# Patient Record
Sex: Female | Born: 1960 | Race: Black or African American | Hispanic: No | Smoking: Former smoker
Health system: Southern US, Community
[De-identification: ages and names within clinical notes are randomized; demographics above are authoritative.]

## PROBLEM LIST (undated history)

## (undated) DIAGNOSIS — D219 Benign neoplasm of connective and other soft tissue, unspecified: Secondary | ICD-10-CM

## (undated) DIAGNOSIS — G4730 Sleep apnea, unspecified: Secondary | ICD-10-CM

## (undated) HISTORY — PX: TOTAL ABDOMINAL HYSTERECTOMY: SHX209

## (undated) HISTORY — PX: TUBAL LIGATION: SHX77

## (undated) HISTORY — DX: Benign neoplasm of connective and other soft tissue, unspecified: D21.9

## (undated) HISTORY — PX: LASIK: SHX215

## (undated) HISTORY — PX: ABDOMINAL HYSTERECTOMY: SHX81

## (undated) HISTORY — PX: OTHER SURGICAL HISTORY: SHX169

## (undated) HISTORY — DX: Morbid (severe) obesity due to excess calories: E66.01

---
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
## 1998-10-04 ENCOUNTER — Other Ambulatory Visit: Admission: RE | Admit: 1998-10-04 | Discharge: 1998-10-04 | Payer: Self-pay | Admitting: Obstetrics and Gynecology

## 2000-06-02 ENCOUNTER — Emergency Department (HOSPITAL_COMMUNITY): Admission: EM | Admit: 2000-06-02 | Discharge: 2000-06-02 | Payer: Self-pay | Admitting: Emergency Medicine

## 2002-09-04 ENCOUNTER — Emergency Department (HOSPITAL_COMMUNITY): Admission: EM | Admit: 2002-09-04 | Discharge: 2002-09-04 | Payer: Self-pay

## 2002-09-29 ENCOUNTER — Ambulatory Visit (HOSPITAL_COMMUNITY): Admission: RE | Admit: 2002-09-29 | Discharge: 2002-09-29 | Payer: Self-pay | Admitting: Internal Medicine

## 2002-09-29 ENCOUNTER — Encounter: Payer: Self-pay | Admitting: Internal Medicine

## 2007-04-23 ENCOUNTER — Emergency Department (HOSPITAL_COMMUNITY): Admission: EM | Admit: 2007-04-23 | Discharge: 2007-04-23 | Payer: Self-pay | Admitting: Emergency Medicine

## 2019-12-10 DIAGNOSIS — G4733 Obstructive sleep apnea (adult) (pediatric): Secondary | ICD-10-CM

## 2019-12-10 HISTORY — DX: Obstructive sleep apnea (adult) (pediatric): G47.33

## 2020-07-27 ENCOUNTER — Encounter: Payer: Self-pay | Admitting: Family Medicine

## 2020-07-27 ENCOUNTER — Ambulatory Visit (INDEPENDENT_AMBULATORY_CARE_PROVIDER_SITE_OTHER): Payer: 59 | Admitting: Family Medicine

## 2020-07-27 ENCOUNTER — Other Ambulatory Visit: Payer: Self-pay

## 2020-07-27 VITALS — BP 140/82 | HR 84 | Temp 96.4°F | Resp 18 | Ht 64.0 in | Wt 294.0 lb

## 2020-07-27 DIAGNOSIS — G4733 Obstructive sleep apnea (adult) (pediatric): Secondary | ICD-10-CM

## 2020-07-27 DIAGNOSIS — R0789 Other chest pain: Secondary | ICD-10-CM

## 2020-07-27 DIAGNOSIS — R062 Wheezing: Secondary | ICD-10-CM

## 2020-07-27 DIAGNOSIS — Z1231 Encounter for screening mammogram for malignant neoplasm of breast: Secondary | ICD-10-CM

## 2020-07-27 DIAGNOSIS — N3941 Urge incontinence: Secondary | ICD-10-CM

## 2020-07-27 DIAGNOSIS — Z6841 Body Mass Index (BMI) 40.0 and over, adult: Secondary | ICD-10-CM

## 2020-07-27 LAB — POCT URINALYSIS DIP (CLINITEK)
Bilirubin, UA: NEGATIVE
Glucose, UA: NEGATIVE mg/dL
Ketones, POC UA: NEGATIVE mg/dL
Leukocytes, UA: NEGATIVE
Nitrite, UA: NEGATIVE
Spec Grav, UA: 1.015 (ref 1.010–1.025)
Urobilinogen, UA: 0.2 E.U./dL
pH, UA: 6 (ref 5.0–8.0)

## 2020-07-27 LAB — TROPONIN T: Troponin T (Highly Sensitive): 6 ng/L (ref 0–14)

## 2020-07-27 NOTE — Progress Notes (Signed)
New Patient Office Visit  Subjective:  Patient ID: Virginia Barron, female    DOB: 1961-02-06  Age: 59 y.o. MRN: [id number]  CC:  Chief Complaint  Patient presents with  . Chest Pain    HPI Virginia Barron presents as a new patient. She is complaining of chest tightness about twice a week for 1-2 months. Usually at bedtime. Accompanied by wheezing. Complaining of swelling in legs. Occasionally her left arm goes a little numb.  Patient has not been seen by primary care physician since 2009.  This was apparently due to lack of insurance.  Has OSA. Has the machine, but needs filters/tubing. Diagnosed 6 months ago through a study.  Home she is currently living is infested with roaches and mold. Living there since 2013. Per patient landlord is working on situation.  Patient has had no preventative care in years.  Past Medical History:  Diagnosis Date  . Fibroids   . Morbid obesity ([place])   . OSA on CPAP 2021    Past Surgical History:  Procedure Laterality Date  . ABDOMINAL HYSTERECTOMY     precancerous cervical cells and fibroids.  . conescopy     abnormal pap  . LASIK    . TUBAL LIGATION      Family History  Problem Relation Age of Onset  . Diabetes Mother   . Diabetes Father   . Diabetes Sister     Social History   Socioeconomic History  . Marital status: Divorced    Spouse name: Not on file  . Number of children: Not on file  . Years of education: Not on file  . Highest education level: Not on file  Occupational History  . Not on file  Tobacco Use  . Smoking status: Never Smoker  . Smokeless tobacco: Never Used  Substance and Sexual Activity  . Alcohol use: Yes    Alcohol/week: 1.0 standard drink    Types: 1 Standard drinks or equivalent per week    Comment: per month  . Drug use: Never  . Sexual activity: Not on file  Other Topics Concern  . Not on file  Social History Narrative  . Not on file   Social Determinants of Health   Financial Resource  Strain:   . Difficulty of Paying Living Expenses: Not on file  Food Insecurity:   . Worried About Charity fundraiser in the Last Year: Not on file  . Ran Out of Food in the Last Year: Not on file  Transportation Needs:   . Lack of Transportation (Medical): Not on file  . Lack of Transportation (Non-Medical): Not on file  Physical Activity:   . Days of Exercise per Week: Not on file  . Minutes of Exercise per Session: Not on file  Stress:   . Feeling of Stress : Not on file  Social Connections:   . Frequency of Communication with Friends and Family: Not on file  . Frequency of Social Gatherings with Friends and Family: Not on file  . Attends Religious Services: Not on file  . Active Member of Clubs or Organizations: Not on file  . Attends Archivist Meetings: Not on file  . Marital Status: Not on file  Intimate Partner Violence:   . Fear of Current or Ex-Partner: Not on file  . Emotionally Abused: Not on file  . Physically Abused: Not on file  . Sexually Abused: Not on file    ROS Review of Systems  Constitutional: Negative for chills,  fatigue and fever.  HENT: Negative for congestion, ear pain and sore throat.   Respiratory: Positive for shortness of breath and wheezing. Negative for cough.   Cardiovascular: Positive for chest pain and leg swelling.  Gastrointestinal: Negative for abdominal pain, constipation, diarrhea, nausea and vomiting.  Genitourinary: Negative for dysuria and urgency.       Some urge incontinence  Musculoskeletal: Negative for arthralgias and myalgias.  Skin: Negative for rash.  Neurological: Negative for dizziness and headaches.  Psychiatric/Behavioral: Negative for dysphoric mood. The patient is not nervous/anxious.     Objective:   Today's Vitals: BP 140/82   Pulse 84   Temp (!) 96.4 F (35.8 C)   Resp 18   Ht 5\' 4"  (1.626 m)   Wt 294 lb (133.4 kg)   BMI 50.46 kg/m   Physical Exam Vitals reviewed.  Constitutional:       Appearance: Normal appearance. She is well-developed. She is obese.  Cardiovascular:     Rate and Rhythm: Normal rate and regular rhythm.     Pulses: Normal pulses.     Heart sounds: Normal heart sounds.  Pulmonary:     Effort: Pulmonary effort is normal. No respiratory distress.     Breath sounds: Normal breath sounds.  Abdominal:     General: Abdomen is flat. Bowel sounds are normal.     Palpations: Abdomen is soft.     Tenderness: There is no abdominal tenderness.  Neurological:     Mental Status: She is alert and oriented to person, place, and time.  Psychiatric:        Mood and Affect: Mood normal.        Behavior: Behavior normal.     Assessment & Plan:  1. Urge incontinence - POCT URINALYSIS DIP (CLINITEK) - normal  2. OSA (obstructive sleep apnea) Order supplies for patient.   3. Atypical chest pain - Recommend start on aspirin 81 mg once daily. - EKG - abnormal. Inverted T waves in multiple leads. Patient needs cardiac workup if troponin T is negative. - Refer to cardiology. - Troponin T  4. Wheezing - by history only. Lungs clear today. Likely due to mold in home. I strongly recommend pt discuss further with land lord.   5. Morbid obesity with body mass index (BMI) of 50.0 to 59.9 in adult [facility]) Will have pt return to discuss diet and exercise, as well as check her cholesterol. - CBC with Differential/Platelet - Comprehensive metabolic panel - TSH  6. Screening for breast cancer - order mammogram.   Outpatient Encounter Medications as of 07/27/2020  Medication Sig  . cholecalciferol (VITAMIN D3) 25 MCG (1000 UNIT) tablet Take 1,000 Units by mouth daily.  . Multiple Vitamin (MULTIVITAMIN) tablet Take 1 tablet by mouth daily.   No facility-administered encounter medications on file as of 07/27/2020.    Follow-up: No follow-ups on file.   [name], MD

## 2020-07-29 LAB — COMPREHENSIVE METABOLIC PANEL
ALT: 21 IU/L (ref 0–32)
AST: 25 IU/L (ref 0–40)
Albumin/Globulin Ratio: 1.6 (ref 1.2–2.2)
Albumin: 4.6 g/dL (ref 3.8–4.9)
Alkaline Phosphatase: 78 IU/L (ref 48–121)
BUN/Creatinine Ratio: 16 (ref 12–28)
BUN: 16 mg/dL (ref 8–27)
Bilirubin Total: 0.3 mg/dL (ref 0.0–1.2)
CO2: 22 mmol/L (ref 20–29)
Calcium: 9.4 mg/dL (ref 8.7–10.3)
Chloride: 102 mmol/L (ref 96–106)
Creatinine, Ser: 1.02 mg/dL — ABNORMAL HIGH (ref 0.57–1.00)
GFR calc Af Amer: 69 mL/min/{1.73_m2} (ref >59)
GFR calc non Af Amer: 60 mL/min/{1.73_m2} (ref >59)
Globulin, Total: 2.8 g/dL (ref 1.5–4.5)
Glucose: 85 mg/dL (ref 65–99)
Potassium: 4 mmol/L (ref 3.5–5.2)
Sodium: 140 mmol/L (ref 134–144)
Total Protein: 7.4 g/dL (ref 6.0–8.5)

## 2020-07-29 LAB — CBC WITH DIFFERENTIAL/PLATELET
Basophils Absolute: 0.1 10*3/uL (ref 0.0–0.2)
Basos: 1 %
EOS (ABSOLUTE): 0.2 10*3/uL (ref 0.0–0.4)
Eos: 3 %
Hematocrit: 46.5 % (ref 34.0–46.6)
Hemoglobin: 14.6 g/dL (ref 11.1–15.9)
Immature Grans (Abs): 0 10*3/uL (ref 0.0–0.1)
Immature Granulocytes: 0 %
Lymphocytes Absolute: 2.1 10*3/uL (ref 0.7–3.1)
Lymphs: 25 %
MCH: 24.8 pg — ABNORMAL LOW (ref 26.6–33.0)
MCHC: 31.4 g/dL — ABNORMAL LOW (ref 31.5–35.7)
MCV: 79 fL (ref 79–97)
Monocytes Absolute: 0.8 10*3/uL (ref 0.1–0.9)
Monocytes: 10 %
Neutrophils Absolute: 5.1 10*3/uL (ref 1.4–7.0)
Neutrophils: 61 %
Platelets: 293 10*3/uL (ref 150–450)
RBC: 5.89 x10E6/uL — ABNORMAL HIGH (ref 3.77–5.28)
RDW: 12.9 % (ref 11.7–15.4)
WBC: 8.4 10*3/uL (ref 3.4–10.8)

## 2020-07-29 LAB — TSH: TSH: 1.23 u[IU]/mL (ref 0.450–4.500)

## 2020-07-30 ENCOUNTER — Encounter: Payer: Self-pay | Admitting: Family Medicine

## 2020-07-30 DIAGNOSIS — G4733 Obstructive sleep apnea (adult) (pediatric): Secondary | ICD-10-CM | POA: Insufficient documentation

## 2020-07-30 DIAGNOSIS — N3941 Urge incontinence: Secondary | ICD-10-CM | POA: Insufficient documentation

## 2020-07-30 DIAGNOSIS — R0789 Other chest pain: Secondary | ICD-10-CM

## 2020-07-30 DIAGNOSIS — Z6841 Body Mass Index (BMI) 40.0 and over, adult: Secondary | ICD-10-CM | POA: Insufficient documentation

## 2020-07-30 HISTORY — DX: Other chest pain: R07.89

## 2020-07-30 HISTORY — DX: Obstructive sleep apnea (adult) (pediatric): G47.33

## 2020-07-30 HISTORY — DX: Morbid (severe) obesity due to excess calories: E66.01

## 2020-07-30 HISTORY — DX: Urge incontinence: N39.41

## 2020-07-31 ENCOUNTER — Encounter: Payer: Self-pay | Admitting: Family Medicine

## 2020-08-02 ENCOUNTER — Other Ambulatory Visit: Payer: Self-pay | Admitting: Family Medicine

## 2020-08-02 ENCOUNTER — Other Ambulatory Visit: Payer: Self-pay | Admitting: *Deleted

## 2020-08-02 DIAGNOSIS — Z1231 Encounter for screening mammogram for malignant neoplasm of breast: Secondary | ICD-10-CM

## 2020-08-03 ENCOUNTER — Other Ambulatory Visit: Payer: Self-pay

## 2020-08-03 DIAGNOSIS — R0789 Other chest pain: Secondary | ICD-10-CM

## 2020-08-03 NOTE — Progress Notes (Signed)
ca

## 2020-08-06 ENCOUNTER — Encounter: Payer: Self-pay | Admitting: Family Medicine

## 2020-08-11 DIAGNOSIS — D219 Benign neoplasm of connective and other soft tissue, unspecified: Secondary | ICD-10-CM | POA: Insufficient documentation

## 2020-08-15 ENCOUNTER — Encounter: Payer: Self-pay | Admitting: *Deleted

## 2020-08-15 ENCOUNTER — Other Ambulatory Visit: Payer: Self-pay

## 2020-08-15 ENCOUNTER — Ambulatory Visit (INDEPENDENT_AMBULATORY_CARE_PROVIDER_SITE_OTHER): Payer: 59 | Admitting: Cardiology

## 2020-08-15 VITALS — BP 148/84 | HR 81 | Ht 64.0 in | Wt 301.0 lb

## 2020-08-15 DIAGNOSIS — R0602 Shortness of breath: Secondary | ICD-10-CM

## 2020-08-15 DIAGNOSIS — R9431 Abnormal electrocardiogram [ECG] [EKG]: Secondary | ICD-10-CM

## 2020-08-15 DIAGNOSIS — I10 Essential (primary) hypertension: Secondary | ICD-10-CM

## 2020-08-15 DIAGNOSIS — R072 Precordial pain: Secondary | ICD-10-CM

## 2020-08-15 HISTORY — DX: Essential (primary) hypertension: I10

## 2020-08-15 HISTORY — DX: Shortness of breath: R06.02

## 2020-08-15 HISTORY — DX: Precordial pain: R07.2

## 2020-08-15 MED ORDER — METOPROLOL TARTRATE 100 MG PO TABS: 100.0000 mg | ORAL_TABLET | Freq: Once | ORAL | 0 refills | Status: DC

## 2020-08-15 MED ORDER — HYDROCHLOROTHIAZIDE 12.5 MG PO CAPS: 12.5000 mg | ORAL_CAPSULE | Freq: Every day | ORAL | 3 refills | Status: DC

## 2020-08-15 NOTE — Progress Notes (Signed)
Cardiology Office Note:    Date:  4/0/9811   ID:  Virginia Barron, DOB [birth year], MRN [id number]  PCP:  [name], MD  Cardiologist:  No primary care provider on file.  Electrophysiologist:  None   Referring MD: [name], MD   Chief Complaint  Patient presents with  . Chest Pain   History of Present Illness:    Virginia Barron is a 59 y.o. female with a hx of morbid obesity, OSA on CPAP.  The patient was referred by her primary care provider to be evaluated for chest pain and shortness of breath.  The patient tells me that she has been experiencing intermittent shortness of breath which is getting worse.  But she notes that he has chest pains as well.  She described the chest pain as a left-sided squeezing sensation, chest tightness which lasts less than 30 minutes and then resolves.  She tells me that sometimes she gets left arm numbness but notes that she has this maybe once or twice a month.  What bothers him is the shortness of breath.  Note she reported premature coronary artery disease in her father at age 43 he had of coronary artery bypass grafting.  Past Medical History:  Diagnosis Date  . Atypical chest pain 07/30/2020  . Fibroids   . Morbid obesity ([place])   . Morbid obesity with body mass index (BMI) of 50.0 to 59.9 in adult [facility]) 07/30/2020  . OSA (obstructive sleep apnea) 07/30/2020  . OSA on CPAP 2021  . Urge incontinence 07/30/2020    Past Surgical History:  Procedure Laterality Date  . ABDOMINAL HYSTERECTOMY     precancerous cervical cells and fibroids.  . conescopy     abnormal pap  . LASIK    . TUBAL LIGATION      Current Medications: Current Meds  Medication Sig  . aspirin-acetaminophen-caffeine (EXCEDRIN MIGRAINE) 250-250-65 MG tablet Take 1 tablet by mouth every 6 (six) hours as needed for headache.  . cholecalciferol (VITAMIN D3) 25 MCG (1000 UNIT) tablet Take 1,000 Units by mouth daily.  . diphenhydrAMINE (BENADRYL) 25 MG tablet Take 25 mg  by mouth as needed.  . lactobacillus acidophilus (BACID) TABS tablet Take 2 tablets by mouth daily.  . Multiple Vitamin (MULTIVITAMIN) tablet Take 1 tablet by mouth daily.     Allergies:   Avocado, Iodine, Penicillins, Pork-derived products, Shellfish allergy, and Latex   Social History   Socioeconomic History  . Marital status: Divorced    Spouse name: Not on file  . Number of children: Not on file  . Years of education: Not on file  . Highest education level: Not on file  Occupational History  . Not on file  Tobacco Use  . Smoking status: Never Smoker  . Smokeless tobacco: Never Used  Substance and Sexual Activity  . Alcohol use: Yes    Alcohol/week: 1.0 standard drink    Types: 1 Standard drinks or equivalent per week    Comment: per month  . Drug use: Never  . Sexual activity: Not on file  Other Topics Concern  . Not on file  Social History Narrative  . Not on file   Social Determinants of Health   Financial Resource Strain:   . Difficulty of Paying Living Expenses: Not on file  Food Insecurity:   . Worried About Charity fundraiser in the Last Year: Not on file  . Ran Out of Food in the Last Year: Not on file  Transportation Needs:   .  Lack of Transportation (Medical): Not on file  . Lack of Transportation (Non-Medical): Not on file  Physical Activity:   . Days of Exercise per Week: Not on file  . Minutes of Exercise per Session: Not on file  Stress:   . Feeling of Stress : Not on file  Social Connections:   . Frequency of Communication with Friends and Family: Not on file  . Frequency of Social Gatherings with Friends and Family: Not on file  . Attends Religious Services: Not on file  . Active Member of Clubs or Organizations: Not on file  . Attends Archivist Meetings: Not on file  . Marital Status: Not on file     Family History: The patient's family history includes Diabetes in her father, mother, and sister; Heart attack in her  father.  ROS:   Review of Systems  Constitution: Negative for decreased appetite, fever and weight gain.  HENT: Negative for congestion, ear discharge, hoarse voice and sore throat.   Eyes: Negative for discharge, redness, vision loss in right eye and visual halos.  Cardiovascular: Negative for chest pain, dyspnea on exertion, leg swelling, orthopnea and palpitations.  Respiratory: Negative for cough, hemoptysis, shortness of breath and snoring.   Endocrine: Negative for heat intolerance and polyphagia.  Hematologic/Lymphatic: Negative for bleeding problem. Does not bruise/bleed easily.  Skin: Negative for flushing, nail changes, rash and suspicious lesions.  Musculoskeletal: Negative for arthritis, joint pain, muscle cramps, myalgias, neck pain and stiffness.  Gastrointestinal: Negative for abdominal pain, bowel incontinence, diarrhea and excessive appetite.  Genitourinary: Negative for decreased libido, genital sores and incomplete emptying.  Neurological: Negative for brief paralysis, focal weakness, headaches and loss of balance.  Psychiatric/Behavioral: Negative for altered mental status, depression and suicidal ideas.  Allergic/Immunologic: Negative for HIV exposure and persistent infections.    EKGs/Labs/Other Studies Reviewed:    The following studies were reviewed today:   EKG:  The ekg ordered today demonstrates   Recent Labs: 07/27/2020: ALT 21; BUN 16; Creatinine, Ser 1.02; Hemoglobin 14.6; Platelets 293; Potassium 4.0; Sodium 140; TSH 1.230  Recent Lipid Panel No results found for: CHOL, TRIG, HDL, CHOLHDL, VLDL, LDLCALC, LDLDIRECT  Physical Exam:    VS:  BP (!) 148/84 (BP Location: Left Arm, Patient Position: Sitting, Cuff Size: Large)   Pulse 81   Ht 5' 4"  (1.626 m)   Wt (!) 301 lb (136.5 kg)   SpO2 98%   BMI 51.67 kg/m     Wt Readings from Last 3 Encounters:  08/15/20 (!) 301 lb (136.5 kg)  07/27/20 294 lb (133.4 kg)     GEN: Well nourished, well  developed in no acute distress HEENT: Normal NECK: No JVD; No carotid bruits LYMPHATICS: No lymphadenopathy CARDIAC: S1S2 noted,RRR, no murmurs, rubs, gallops RESPIRATORY:  Clear to auscultation without rales, wheezing or rhonchi  ABDOMEN: Soft, non-tender, non-distended, +bowel sounds, no guarding. EXTREMITIES: No edema, No cyanosis, no clubbing MUSCULOSKELETAL:  No deformity  SKIN: Warm and dry NEUROLOGIC:  Alert and oriented x 3, non-focal PSYCHIATRIC:  Normal affect, good insight  ASSESSMENT:    1. Precordial chest pain   2. Essential hypertension   3. Morbid obesity ([place])   4. Shortness of breath   5. Abnormal EKG    PLAN:    Chest pain is concerning given her family history and risk factors.  He also does have abnormal EKG I discussed with the patient that her symptoms still atypical but I am concerned that this may be her anginal  symptoms.  Therefore we will going to pursue an ischemic evaluation in this patient.  The most reasonable testing for her would be coronary CTA.  She has had a pharmacologic stress test 3 years ago which she reported was normal.  With her shortness of breath I will get an echocardiogram for completeness giving her bilateral leg edema as well.  I will be able to assess LV function and any other structural abnormalities.  The patient understands the need to lose weight with diet and exercise. We have discussed specific strategies for this.  Blood pressure is elevated, I reviewed her blood pressure records I do think the patient has any hypertension with a bilateral leg edema: Started patient hydrochlorothiazide 12.5 mg daily. Blood work will be done for BMP and she does need lipid panel however the patient is not fasting today she will come back to in a week to get this done.  The patient is in agreement with the above plan. The patient left the office in stable condition.  The patient will follow up in 1 month due to medication change.   Medication  Adjustments/Labs and Tests Ordered: Current medicines are reviewed at length with the patient today.  Concerns regarding medicines are outlined above.  Orders Placed This Encounter  Procedures  . CT CORONARY MORPH W/CTA COR W/SCORE W/CA W/CM &/OR WO/CM  . CT CORONARY FRACTIONAL FLOW RESERVE DATA PREP  . CT CORONARY FRACTIONAL FLOW RESERVE FLUID ANALYSIS  . Lipid panel  . Basic metabolic panel  . EKG 12-Lead  . ECHOCARDIOGRAM COMPLETE   Meds ordered this encounter  Medications  . hydrochlorothiazide (MICROZIDE) 12.5 MG capsule    Sig: Take 1 capsule (12.5 mg total) by mouth daily.    Dispense:  90 capsule    Refill:  3  . metoprolol tartrate (LOPRESSOR) 100 MG tablet    Sig: Take 1 tablet (100 mg total) by mouth once for 1 dose.    Dispense:  1 tablet    Refill:  0    Patient Instructions  Medication Instructions:  Your physician has recommended you make the following change in your medication:  START: Hydrochlorothiazide 12.5 mg take one tablet by mouth daily.  *If you need a refill on your cardiac medications before your next appointment, please call your pharmacy*   Lab Work: Your physician recommends that you return for lab work in: TODAY Lipids  Within one week of your cardiac CT: BMP If you have labs (blood work) drawn today and your tests are completely normal, you will receive your results only by: Marland Kitchen MyChart Message (if you have MyChart) OR . A paper copy in the mail If you have any lab test that is abnormal or we need to change your treatment, we will call you to review the results.   Testing/Procedures: Your cardiac CT will be scheduled at one of the below locations:   [facility] [address] [place], [place] [address] [phone]   If scheduled at [facility], please arrive at the [facility] main entrance of [facility] 30 minutes prior to test start time. Proceed to the [facility] Radiology Department (first floor) to  check-in and test prep.  Please follow these instructions carefully (unless otherwise directed):   On the Night Before the Test: . Be sure to Drink plenty of water. . Do not consume any caffeinated/decaffeinated beverages or chocolate 12 hours prior to your test. . Do not take any antihistamines 12 hours prior  to your test.   On the Day of the Test: . Drink plenty of water. Do not drink any water within one hour of the test. . Do not eat any food 4 hours prior to the test. . You may take your regular medications prior to the test.  . Take metoprolol (Lopressor) two hours prior to test. . HOLD Hydrochlorothiazide morning of the test. . FEMALES- please wear underwire-free bra if available   *For Clinical Staff only. Please instruct patient the following:*        -Drink plenty of water       -Hold Furosemide/hydrochlorothiazide morning of the test       -Take metoprolol (Lopressor) 2 hours prior to test (if applicable).                  -If HR is less than 55 BPM- No Lopressor                -IF HR is greater than 55 BPM and patient is less than or equal to 97 yrs old Lopressor 172m x1.                -If HR is greater than 55 BPM and patient is greater than 766yrs old Lopressor 50 mg x1.     Do not give Lopressor to patients with an allergy to lopressor or anyone with asthma or active COPD symptoms (currently taking steroids).       After the Test: . Drink plenty of water. . After receiving IV contrast, you may experience a mild flushed feeling. This is normal. . On occasion, you may experience a mild rash up to 24 hours after the test. This is not dangerous. If this occurs, you can take Benadryl 25 mg and increase your fluid intake. . If you experience trouble breathing, this can be serious. If it is severe call 911 IMMEDIATELY. If it is mild, please call our office. . If you take any of these medications: Glipizide/Metformin, Avandament, Glucavance, please do not take 48 hours  after completing test unless otherwise instructed.   Once we have confirmed authorization from your insurance company, we will call you to set up a date and time for your test. Based on how quickly your insurance processes prior authorizations requests, please allow up to 4 weeks to be contacted for scheduling your Cardiac CT appointment. Be advised that routine Cardiac CT appointments could be scheduled as many as 8 weeks after your provider has ordered it.  For non-scheduling related questions, please contact the cardiac imaging nurse navigator should you have any questions/concerns: SMarchia Bond Cardiac Imaging Nurse Navigator MBurley Saver Interim Cardiac Imaging Nurse [facility] Direct Office Dial: [phone]  For scheduling needs, including cancellations and rescheduling, please call TVivien Rotaat [phone] option 3.   Your physician has requested that you have an echocardiogram. Echocardiography is a painless test that uses sound waves to create images of your heart. It provides your doctor with information about the size and shape of your heart and how well your heart's chambers and valves are working. This procedure takes approximately one hour. There are no restrictions for this procedure.       Follow-Up: [facility] you and your health needs are our priority.  As part of our continuing mission to provide you with exceptional heart care, we have created designated Provider Care Teams.  These Care Teams include your primary Cardiologist (physician) and Advanced Practice  Providers (APPs -  Physician Assistants and Nurse Practitioners) who all work together to provide you with the care you need, when you need it.  We recommend signing up for the patient portal called "MyChart".  Sign up information is provided on this After Visit Summary.  MyChart is used to connect with patients for Virtual Visits (Telemedicine).  Patients are able to view  lab/test results, encounter notes, upcoming appointments, etc.  Non-urgent messages can be sent to your provider as well.   To learn more about what you can do with MyChart, go to [email or web address].    Your next appointment:   1 month(s)  The format for your next appointment:   In Person  Provider:   [name], DO   Other Instructions      Adopting a Healthy Lifestyle.  Know what a healthy weight is for you (roughly BMI <25) and aim to maintain this   Aim for 7+ servings of fruits and vegetables daily   65-80+ fluid ounces of water or unsweet tea for healthy kidneys   Limit to max 1 drink of alcohol per day; avoid smoking/tobacco   Limit animal fats in diet for cholesterol and heart health - choose grass fed whenever available   Avoid highly processed foods, and foods high in saturated/trans fats   Aim for low stress - take time to unwind and care for your mental health   Aim for 150 min of moderate intensity exercise weekly for heart health, and weights twice weekly for bone health   Aim for 7-9 hours of sleep daily   When it comes to diets, agreement about the perfect plan isnt easy to find, even among the experts. Experts at the [employer] developed an idea known as the Healthy Eating Plate. Just imagine a plate divided into logical, healthy portions.   The emphasis is on diet quality:   Load up on vegetables and fruits - one-half of your plate: Aim for color and variety, and remember that potatoes dont count.   Go for whole grains - one-quarter of your plate: Whole wheat, barley, wheat berries, quinoa, oats, brown rice, and foods made with them. If you want pasta, go with whole wheat pasta.   Protein power - one-quarter of your plate: Fish, chicken, beans, and nuts are all healthy, versatile protein sources. Limit red meat.   The diet, however, does go beyond the plate, offering a few other suggestions.   Use healthy plant oils,  such as olive, canola, soy, corn, sunflower and peanut. Check the labels, and avoid partially hydrogenated oil, which have unhealthy trans fats.   If youre thirsty, drink water. Coffee and tea are good in moderation, but skip sugary drinks and limit milk and dairy products to one or two daily servings.   The type of carbohydrate in the diet is more important than the amount. Some sources of carbohydrates, such as vegetables, fruits, whole grains, and beans-are healthier than others.   Finally, stay active  Signed, [name], DO  08/15/2020 3:00 PM    [place]

## 2020-08-15 NOTE — Patient Instructions (Addendum)
Medication Instructions:  Your physician has recommended you make the following change in your medication:  START: Hydrochlorothiazide 12.5 mg take one tablet by mouth daily.  *If you need a refill on your cardiac medications before your next appointment, please call your pharmacy*   Lab Work: Your physician recommends that you return for lab work in: TODAY Lipids  Within one week of your cardiac CT: BMP If you have labs (blood work) drawn today and your tests are completely normal, you will receive your results only by:  [place] (if you have MyChart) OR  A paper copy in the mail If you have any lab test that is abnormal or we need to change your treatment, we will call you to review the results.   Testing/Procedures: Your cardiac CT will be scheduled at one of the below locations:   [facility] [address] [place], [place] [address] [phone]   If scheduled at [facility], please arrive at the [facility] main entrance of [facility] 30 minutes prior to test start time. Proceed to the [facility] Radiology Department (first floor) to check-in and test prep.  Please follow these instructions carefully (unless otherwise directed):   On the Night Before the Test:  Be sure to Drink plenty of water.  Do not consume any caffeinated/decaffeinated beverages or chocolate 12 hours prior to your test.  Do not take any antihistamines 12 hours prior to your test.   On the Day of the Test:  Drink plenty of water. Do not drink any water within one hour of the test.  Do not eat any food 4 hours prior to the test.  You may take your regular medications prior to the test.   Take metoprolol (Lopressor) two hours prior to test.  HOLD Hydrochlorothiazide morning of the test.  FEMALES- please wear underwire-free bra if available  After the Test:  Drink plenty of water.  After receiving IV contrast, you may experience a mild flushed  feeling. This is normal.  On occasion, you may experience a mild rash up to 24 hours after the test. This is not dangerous. If this occurs, you can take Benadryl 25 mg and increase your fluid intake.  If you experience trouble breathing, this can be serious. If it is severe call 911 IMMEDIATELY. If it is mild, please call our office.  If you take any of these medications: Glipizide/Metformin, Avandament, Glucavance, please do not take 48 hours after completing test unless otherwise instructed.   Once we have confirmed authorization from your insurance company, we will call you to set up a date and time for your test. Based on how quickly your insurance processes prior authorizations requests, please allow up to 4 weeks to be contacted for scheduling your Cardiac CT appointment. Be advised that routine Cardiac CT appointments could be scheduled as many as 8 weeks after your provider has ordered it.  For non-scheduling related questions, please contact the cardiac imaging nurse navigator should you have any questions/concerns: Marchia Bond, Cardiac Imaging Nurse Navigator Burley Saver, Interim Cardiac Imaging Nurse [place] [facility] Direct Office Dial: [phone]   For scheduling needs, including cancellations and rescheduling, please call Vivien Rota at [phone], option [phone].   Your physician has requested that you have an echocardiogram. Echocardiography is a painless test that uses sound waves to create images of your heart. It provides your doctor with information about the size and shape of your heart and how well  your hearts chambers and valves are working. This procedure takes approximately one hour. There are no restrictions for this procedure.       Follow-Up: At [facility], you and your health needs are our priority.  As part of our continuing mission to provide you with exceptional heart care, we have created designated Provider Care Teams.  These Care  Teams include your primary Cardiologist (physician) and Advanced Practice Providers (APPs -  Physician Assistants and Nurse Practitioners) who all work together to provide you with the care you need, when you need it.  We recommend signing up for the patient portal called "MyChart".  Sign up information is provided on this After Visit Summary.  MyChart is used to connect with patients for Virtual Visits (Telemedicine).  Patients are able to view lab/test results, encounter notes, upcoming appointments, etc.  Non-urgent messages can be sent to your provider as well.   To learn more about what you can do with MyChart, go to [email or web address].    Your next appointment:   1 month(s)  The format for your next appointment:   In Person  Provider:   [name], DO   Other Instructions

## 2020-08-16 LAB — LIPID PANEL
Chol/HDL Ratio: 3.7 ratio (ref 0.0–4.4)
Cholesterol, Total: 132 mg/dL (ref 100–199)
HDL: 36 mg/dL — ABNORMAL LOW (ref >39)
LDL Chol Calc (NIH): 79 mg/dL (ref 0–99)
Triglycerides: 89 mg/dL (ref 0–149)
VLDL Cholesterol Cal: 17 mg/dL (ref 5–40)

## 2020-08-17 ENCOUNTER — Telehealth: Payer: Self-pay

## 2020-08-17 ENCOUNTER — Ambulatory Visit
Admission: RE | Admit: 2020-08-17 | Discharge: 2020-08-17 | Disposition: A | Discharge status: Home / Self Care | Payer: 59 | Source: Ambulatory Visit

## 2020-08-17 ENCOUNTER — Other Ambulatory Visit: Payer: Self-pay

## 2020-08-17 DIAGNOSIS — Z1231 Encounter for screening mammogram for malignant neoplasm of breast: Secondary | ICD-10-CM

## 2020-08-17 NOTE — Telephone Encounter (Signed)
-----   Message from [name], DO sent at 08/16/2020  8:13 AM EDT ----- HDL is slightly lower.  Recommend increasing nuts, fish and decreasing red meat in her diet.

## 2020-08-17 NOTE — Telephone Encounter (Signed)
Left message on patients voicemail to please return our call.   

## 2020-08-18 ENCOUNTER — Telehealth: Payer: Self-pay

## 2020-08-18 NOTE — Telephone Encounter (Signed)
Spoke with patient regarding results and recommendation.  Patient verbalizes understanding and is agreeable to plan of care. Advised patient to call back with any issues or concerns.  

## 2020-08-18 NOTE — Telephone Encounter (Signed)
-----   Message from [name], DO sent at 08/16/2020  8:13 AM EDT ----- HDL is slightly lower.  Recommend increasing nuts, fish and decreasing red meat in her diet.

## 2020-08-24 ENCOUNTER — Other Ambulatory Visit: Payer: Self-pay | Admitting: Cardiology

## 2020-08-25 LAB — BASIC METABOLIC PANEL
BUN/Creatinine Ratio: 16 (ref 12–28)
BUN: 15 mg/dL (ref 8–27)
CO2: 26 mmol/L (ref 20–29)
Calcium: 9.4 mg/dL (ref 8.7–10.3)
Chloride: 99 mmol/L (ref 96–106)
Creatinine, Ser: 0.93 mg/dL (ref 0.57–1.00)
GFR calc Af Amer: 77 mL/min/{1.73_m2} (ref >59)
GFR calc non Af Amer: 67 mL/min/{1.73_m2} (ref >59)
Glucose: 87 mg/dL (ref 65–99)
Potassium: 3.8 mmol/L (ref 3.5–5.2)
Sodium: 138 mmol/L (ref 134–144)

## 2020-09-03 ENCOUNTER — Encounter (INDEPENDENT_AMBULATORY_CARE_PROVIDER_SITE_OTHER): Payer: Self-pay

## 2020-09-07 ENCOUNTER — Other Ambulatory Visit: Payer: Self-pay

## 2020-09-07 ENCOUNTER — Ambulatory Visit (INDEPENDENT_AMBULATORY_CARE_PROVIDER_SITE_OTHER): Payer: 59

## 2020-09-07 DIAGNOSIS — I10 Essential (primary) hypertension: Secondary | ICD-10-CM | POA: Diagnosis not present

## 2020-09-07 DIAGNOSIS — R072 Precordial pain: Secondary | ICD-10-CM | POA: Diagnosis not present

## 2020-09-07 DIAGNOSIS — R0602 Shortness of breath: Secondary | ICD-10-CM | POA: Diagnosis not present

## 2020-09-07 LAB — ECHOCARDIOGRAM COMPLETE
Area-P 1/2: 3.72 cm2
S' Lateral: 2.8 cm

## 2020-09-07 NOTE — Progress Notes (Signed)
Complete echocardiogram performed.  Jimmy Ryleigh Esqueda RDCS, RVT  

## 2020-09-08 ENCOUNTER — Telehealth: Payer: Self-pay | Admitting: Cardiology

## 2020-09-08 NOTE — Telephone Encounter (Signed)
Follow up:     Patient returning a call back from yesterday concering some results.

## 2020-09-08 NOTE — Telephone Encounter (Signed)
Spoke with patient regarding results and recommendation.  Patient verbalizes understanding and is agreeable to plan of care. Advised patient to call back with any issues or concerns.  

## 2020-09-14 ENCOUNTER — Ambulatory Visit (INDEPENDENT_AMBULATORY_CARE_PROVIDER_SITE_OTHER): Payer: 59 | Admitting: Cardiology

## 2020-09-14 ENCOUNTER — Other Ambulatory Visit: Payer: Self-pay

## 2020-09-14 ENCOUNTER — Encounter: Payer: Self-pay | Admitting: Cardiology

## 2020-09-14 VITALS — BP 156/91 | HR 80 | Ht 64.0 in | Wt 301.6 lb

## 2020-09-14 DIAGNOSIS — G4733 Obstructive sleep apnea (adult) (pediatric): Secondary | ICD-10-CM | POA: Diagnosis not present

## 2020-09-14 DIAGNOSIS — Z23 Encounter for immunization: Secondary | ICD-10-CM | POA: Diagnosis not present

## 2020-09-14 DIAGNOSIS — E669 Obesity, unspecified: Secondary | ICD-10-CM

## 2020-09-14 DIAGNOSIS — R0600 Dyspnea, unspecified: Secondary | ICD-10-CM

## 2020-09-14 DIAGNOSIS — I10 Essential (primary) hypertension: Secondary | ICD-10-CM | POA: Diagnosis not present

## 2020-09-14 DIAGNOSIS — R0609 Other forms of dyspnea: Secondary | ICD-10-CM

## 2020-09-14 DIAGNOSIS — R072 Precordial pain: Secondary | ICD-10-CM

## 2020-09-14 HISTORY — DX: Precordial pain: R07.2

## 2020-09-14 MED ORDER — HYDROCHLOROTHIAZIDE 25 MG PO TABS: 25.0000 mg | ORAL_TABLET | Freq: Every day | ORAL | 3 refills | Status: DC

## 2020-09-14 MED ORDER — NITROGLYCERIN 0.4 MG SL SUBL: 0.4000 mg | SUBLINGUAL_TABLET | SUBLINGUAL | 3 refills | Status: DC | PRN

## 2020-09-14 NOTE — Patient Instructions (Addendum)
Medication Instructions:  Your physician has recommended you make the following change in your medication:  START: Nitroglycerin 0.4 mg take one tablet by mouth every 5 minutes as needed for chest pain.  START: Hydrochlorothiazide 25 mg take one tablet by mouth daily.  *If you need a refill on your cardiac medications before your next appointment, please call your pharmacy*   Lab Work: Your physician recommends that you return for lab work in: [place] If you have labs (blood work) drawn today and your tests are completely normal, you will receive your results only by: [place] Kitchen MyChart Message (if you have MyChart) OR . A paper copy in the mail If you have any lab test that is abnormal or we need to change your treatment, we will call you to review the results.   Testing/Procedures: None   Follow-Up: At [facility], you and your health needs are our priority.  As part of our continuing mission to provide you with exceptional heart care, we have created designated Provider Care Teams.  These Care Teams include your primary Cardiologist (physician) and Advanced Practice Providers (APPs -  Physician Assistants and Nurse Practitioners) who all work together to provide you with the care you need, when you need it.  We recommend signing up for the patient portal called "MyChart".  Sign up information is provided on this After Visit Summary.  MyChart is used to connect with patients for Virtual Visits (Telemedicine).  Patients are able to view lab/test results, encounter notes, upcoming appointments, etc.  Non-urgent messages can be sent to your provider as well.   To learn more about what you can do with MyChart, go to [email or web address].    Your next appointment:   8 week(s)  The format for your next appointment:   In Person  Provider:   [name], DO   Other Instructions

## 2020-09-14 NOTE — Progress Notes (Signed)
Cardiology Office Note:    Date:  38/06/5642   ID:  Virginia Barron, DOB [birth year], MRN [id number]  PCP:  [name], MD  Cardiologist:  [name], DO  Electrophysiologist:  None   Referring MD: [name], MD   " I am still having chest pain and shortness of breath"   History of Present Illness:    Virginia Barron is a 59 y.o. female with a hx of morbid obesity, OSA on CPAP initially presented to be evaluated for chest pain and shortness of breath.  During her initial evaluation on August 15, 2020 at that time we discussed her symptoms which were intermittent shortness of breath as well as left-sided chest pain.  Due to this I had the patient get an echocardiogram and we please send an order for a coronary CTA.  In the interim she was able to get her echocardiogram we discussed the results today.  However she had not had her coronary CTA. Today she tells me that her left-sided chest squeezing sensation is getting worse over the last month.  She notes that she was able to do things around the house even cut her grass now she is barely able to do this.  The chest pain is more of a left-sided squeezing and tightness sensation.  Is very bothersome for the patient.  With her risk factors and family history she is concerned as well.  She is not experiencing any chest pain today.  Past Medical History:  Diagnosis Date  . Atypical chest pain 07/30/2020  . Fibroids   . Morbid obesity ([place])   . Morbid obesity with body mass index (BMI) of 50.0 to 59.9 in adult [facility]) 07/30/2020  . OSA (obstructive sleep apnea) 07/30/2020  . OSA on CPAP 2021  . Urge incontinence 07/30/2020    Past Surgical History:  Procedure Laterality Date  . ABDOMINAL HYSTERECTOMY     precancerous cervical cells and fibroids.  . conescopy     abnormal pap  . LASIK    . TUBAL LIGATION      Current Medications: Current Meds  Medication Sig  . aspirin-acetaminophen-caffeine (EXCEDRIN MIGRAINE) 250-250-65 MG  tablet Take 1 tablet by mouth every 6 (six) hours as needed for headache.  . cholecalciferol (VITAMIN D3) 25 MCG (1000 UNIT) tablet Take 400 Units by mouth daily.   . diphenhydrAMINE (BENADRYL) 25 MG tablet Take 25 mg by mouth as needed.  . lactobacillus acidophilus (BACID) TABS tablet Take 2 tablets by mouth daily.  . Multiple Vitamin (MULTIVITAMIN) tablet Take 1 tablet by mouth daily.  . [DISCONTINUED] hydrochlorothiazide (MICROZIDE) 12.5 MG capsule Take 1 capsule (12.5 mg total) by mouth daily.     Allergies:   Avocado, Iodine, Penicillins, Pork-derived products, Shellfish allergy, and Latex   Social History   Socioeconomic History  . Marital status: Divorced    Spouse name: Not on file  . Number of children: Not on file  . Years of education: Not on file  . Highest education level: Not on file  Occupational History  . Not on file  Tobacco Use  . Smoking status: Never Smoker  . Smokeless tobacco: Never Used  Substance and Sexual Activity  . Alcohol use: Yes    Alcohol/week: 1.0 standard drink    Types: 1 Standard drinks or equivalent per week    Comment: per month  . Drug use: Never  . Sexual activity: Not on file  Other Topics Concern  . Not on file  Social History Narrative  .  Not on file   Social Determinants of Health   Financial Resource Strain:   . Difficulty of Paying Living Expenses: Not on file  Food Insecurity:   . Worried About Charity fundraiser in the Last Year: Not on file  . Ran Out of Food in the Last Year: Not on file  Transportation Needs:   . Lack of Transportation (Medical): Not on file  . Lack of Transportation (Non-Medical): Not on file  Physical Activity:   . Days of Exercise per Week: Not on file  . Minutes of Exercise per Session: Not on file  Stress:   . Feeling of Stress : Not on file  Social Connections:   . Frequency of Communication with Friends and Family: Not on file  . Frequency of Social Gatherings with Friends and Family: Not  on file  . Attends Religious Services: Not on file  . Active Member of Clubs or Organizations: Not on file  . Attends Archivist Meetings: Not on file  . Marital Status: Not on file     Family History: The patient's family history includes Diabetes in her father, mother, and sister; Heart attack in her father.  ROS:   Review of Systems  Constitution: Negative for decreased appetite, fever and weight gain.  HENT: Negative for congestion, ear discharge, hoarse voice and sore throat.   Eyes: Negative for discharge, redness, vision loss in right eye and visual halos.  Cardiovascular: Negative for chest pain, dyspnea on exertion, leg swelling, orthopnea and palpitations.  Respiratory: Negative for cough, hemoptysis, shortness of breath and snoring.   Endocrine: Negative for heat intolerance and polyphagia.  Hematologic/Lymphatic: Negative for bleeding problem. Does not bruise/bleed easily.  Skin: Negative for flushing, nail changes, rash and suspicious lesions.  Musculoskeletal: Negative for arthritis, joint pain, muscle cramps, myalgias, neck pain and stiffness.  Gastrointestinal: Negative for abdominal pain, bowel incontinence, diarrhea and excessive appetite.  Genitourinary: Negative for decreased libido, genital sores and incomplete emptying.  Neurological: Negative for brief paralysis, focal weakness, headaches and loss of balance.  Psychiatric/Behavioral: Negative for altered mental status, depression and suicidal ideas.  Allergic/Immunologic: Negative for HIV exposure and persistent infections.    EKGs/Labs/Other Studies Reviewed:    The following studies were reviewed today:   EKG: None today  Echocardiogram IMPRESSIONS  1. Left ventricular ejection fraction, by estimation, is 60 to 65%. The left ventricle has normal function. The left ventricle has no regional wall motion abnormalities. There is severe concentric left ventricular hypertrophy. Left ventricular  diastolic  parameters are consistent with Grade I diastolic dysfunction (impaired relaxation).  2. Right ventricular systolic function is normal. The right ventricular size is normal. There is normal pulmonary artery systolic pressure.  3. The mitral valve is normal in structure. No evidence of mitral valve regurgitation. No evidence of mitral stenosis.  4. The aortic valve is normal in structure. Aortic valve regurgitation is not visualized. No aortic stenosis is present.  5. The inferior vena cava is normal in size with greater than 50% respiratory variability, suggesting right atrial pressure of 3 mmHg.    Recent Labs: 07/27/2020: ALT 21; Hemoglobin 14.6; Platelets 293; TSH 1.230 08/24/2020: BUN 15; Creatinine, Ser 0.93; Potassium 3.8; Sodium 138  Recent Lipid Panel    Component Value Date/Time   CHOL 132 08/15/2020 1507   TRIG 89 08/15/2020 1507   HDL 36 (L) 08/15/2020 1507   CHOLHDL 3.7 08/15/2020 1507   LDLCALC 79 08/15/2020 1507    Physical Exam:  VS:  BP (!) 156/91   Pulse 80   Ht 5\' 4"  (1.626 m)   Wt (!) 301 lb 9.6 oz (136.8 kg)   SpO2 96%   BMI 51.77 kg/m     Wt Readings from Last 3 Encounters:  09/14/20 (!) 301 lb 9.6 oz (136.8 kg)  08/15/20 (!) 301 lb (136.5 kg)  07/27/20 294 lb (133.4 kg)     GEN: Well nourished, well developed in no acute distress HEENT: Normal NECK: No JVD; No carotid bruits LYMPHATICS: No lymphadenopathy CARDIAC: S1S2 noted,RRR, no murmurs, rubs, gallops RESPIRATORY:  Clear to auscultation without rales, wheezing or rhonchi  ABDOMEN: Soft, non-tender, non-distended, +bowel sounds, no guarding. EXTREMITIES: No edema, No cyanosis, no clubbing MUSCULOSKELETAL:  No deformity  SKIN: Warm and dry NEUROLOGIC:  Alert and oriented x 3, non-focal PSYCHIATRIC:  Normal affect, good insight  ASSESSMENT:    1. Precordial pain   2. Essential hypertension   3. OSA (obstructive sleep apnea)   4. Need for immunization against influenza   5.  Dyspnea on exertion   6. Obesity (BMI 30-39.9)    PLAN:      Her coronary CTA has been ordered and this study needs to be scheduled.  We will get a reach out to her CTA scheduling.  I did again educate the patient about the test and she still is still willing to proceed with this.  She does have iodine allergy and we will prep the patient for her coronary CTA. Sublingual nitroglycerin prescription was sent, its protocol and 911 protocol explained and the patient vocalized understanding questions were answered to the patient's satisfaction  She still is hypertensive I am going to increase her hydrochlorothiazide to 25 mg daily.  Hoping this can bring her closer to her goal.  If not I will add carvedilol to her medication regimen.  The patient understands the need to lose weight with diet and exercise. We have discussed specific strategies for this.  OSA continue with CPAP.  The patient is in agreement with the above plan. The patient left the office in stable condition.  The patient will follow up in 8 weeks or sooner if needed.   Medication Adjustments/Labs and Tests Ordered: Current medicines are reviewed at length with the patient today.  Concerns regarding medicines are outlined above.  Orders Placed This Encounter  Procedures  . Flu Vaccine QUAD 36+ mos IM  . Basic metabolic panel  . Magnesium  . Split night study   Meds ordered this encounter  Medications  . hydrochlorothiazide (HYDRODIURIL) 25 MG tablet    Sig: Take 1 tablet (25 mg total) by mouth daily.    Dispense:  90 tablet    Refill:  3  . nitroGLYCERIN (NITROSTAT) 0.4 MG SL tablet    Sig: Place 1 tablet (0.4 mg total) under the tongue every 5 (five) minutes as needed for chest pain.    Dispense:  90 tablet    Refill:  3    Patient Instructions  Medication Instructions:  Your physician has recommended you make the following change in your medication:  START: Nitroglycerin 0.4 mg take one tablet by mouth every 5  minutes as needed for chest pain.  START: Hydrochlorothiazide 25 mg take one tablet by mouth daily.  *If you need a refill on your cardiac medications before your next appointment, please call your pharmacy*   Lab Work: Your physician recommends that you return for lab work in: [place] If you have labs (blood  work) drawn today and your tests are completely normal, you will receive your results only by: Marland Kitchen MyChart Message (if you have MyChart) OR . A paper copy in the mail If you have any lab test that is abnormal or we need to change your treatment, we will call you to review the results.   Testing/Procedures: None   Follow-Up: At [facility], you and your health needs are our priority.  As part of our continuing mission to provide you with exceptional heart care, we have created designated Provider Care Teams.  These Care Teams include your primary Cardiologist (physician) and Advanced Practice Providers (APPs -  Physician Assistants and Nurse Practitioners) who all work together to provide you with the care you need, when you need it.  We recommend signing up for the patient portal called "MyChart".  Sign up information is provided on this After Visit Summary.  MyChart is used to connect with patients for Virtual Visits (Telemedicine).  Patients are able to view lab/test results, encounter notes, upcoming appointments, etc.  Non-urgent messages can be sent to your provider as well.   To learn more about what you can do with MyChart, go to [email or web address].    Your next appointment:   8 week(s)  The format for your next appointment:   In Person  Provider:   [name], DO   Other Instructions      Adopting a Healthy Lifestyle.  Know what a healthy weight is for you (roughly BMI <25) and aim to maintain this   Aim for 7+ servings of fruits and vegetables daily   65-80+ fluid ounces of water or unsweet tea for healthy kidneys   Limit to max 1 drink of  alcohol per day; avoid smoking/tobacco   Limit animal fats in diet for cholesterol and heart health - choose grass fed whenever available   Avoid highly processed foods, and foods high in saturated/trans fats   Aim for low stress - take time to unwind and care for your mental health   Aim for 150 min of moderate intensity exercise weekly for heart health, and weights twice weekly for bone health   Aim for 7-9 hours of sleep daily   When it comes to diets, agreement about the perfect plan isnt easy to find, even among the experts. Experts at the [employer] developed an idea known as the Healthy Eating Plate. Just imagine a plate divided into logical, healthy portions.   The emphasis is on diet quality:   Load up on vegetables and fruits - one-half of your plate: Aim for color and variety, and remember that potatoes dont count.   Go for whole grains - one-quarter of your plate: Whole wheat, barley, wheat berries, quinoa, oats, brown rice, and foods made with them. If you want pasta, go with whole wheat pasta.   Protein power - one-quarter of your plate: Fish, chicken, beans, and nuts are all healthy, versatile protein sources. Limit red meat.   The diet, however, does go beyond the plate, offering a few other suggestions.   Use healthy plant oils, such as olive, canola, soy, corn, sunflower and peanut. Check the labels, and avoid partially hydrogenated oil, which have unhealthy trans fats.   If youre thirsty, drink water. Coffee and tea are good in moderation, but skip sugary drinks and limit milk and dairy products to one or two daily servings.   The type of carbohydrate in the diet is more important than the amount.  Some sources of carbohydrates, such as vegetables, fruits, whole grains, and beans-are healthier than others.   Finally, stay active  Signed, [name], DO  09/14/2020 6:17 PM    [facility]

## 2020-09-15 ENCOUNTER — Telehealth: Payer: Self-pay

## 2020-09-15 LAB — BASIC METABOLIC PANEL
BUN/Creatinine Ratio: 13 (ref 12–28)
BUN: 13 mg/dL (ref 8–27)
CO2: 26 mmol/L (ref 20–29)
Calcium: 9.1 mg/dL (ref 8.7–10.3)
Chloride: 104 mmol/L (ref 96–106)
Creatinine, Ser: 1.02 mg/dL — ABNORMAL HIGH (ref 0.57–1.00)
GFR calc Af Amer: 69 mL/min/{1.73_m2} (ref >59)
GFR calc non Af Amer: 60 mL/min/{1.73_m2} (ref >59)
Glucose: 90 mg/dL (ref 65–99)
Potassium: 3.5 mmol/L (ref 3.5–5.2)
Sodium: 143 mmol/L (ref 134–144)

## 2020-09-15 LAB — MAGNESIUM: Magnesium: 2.2 mg/dL (ref 1.6–2.3)

## 2020-09-15 NOTE — Telephone Encounter (Signed)
Spoke with patient regarding results and recommendation.  Patient verbalizes understanding and is agreeable to plan of care. Advised patient to call back with any issues or concerns.  

## 2020-09-15 NOTE — Telephone Encounter (Signed)
-----   Message from [name], DO sent at 09/15/2020 12:24 PM EDT ----- Creatinine slightly elevated but otherwise normal appears to be at baseline about the same 1 month ago. Please check with the patient if she has heard from the [place].

## 2020-09-18 ENCOUNTER — Telehealth: Payer: Self-pay | Admitting: *Deleted

## 2020-09-18 NOTE — Telephone Encounter (Signed)
-----   Message from Gita Kudo, RN sent at 09/14/2020  2:45 PM EDT ----- Please schedule patient for a split night sleep study per Dr. Harriet Masson. The order has been placed.    Thanks,  Lilia Pro, RN

## 2020-10-02 ENCOUNTER — Telehealth (HOSPITAL_COMMUNITY): Payer: Self-pay | Admitting: Emergency Medicine

## 2020-10-02 ENCOUNTER — Encounter (HOSPITAL_COMMUNITY): Payer: Self-pay

## 2020-10-02 DIAGNOSIS — Z91041 Radiographic dye allergy status: Secondary | ICD-10-CM

## 2020-10-02 MED ORDER — PREDNISONE 50 MG PO TABS: ORAL_TABLET | ORAL | 0 refills | Status: DC

## 2020-10-02 NOTE — Telephone Encounter (Signed)
Reaching out to patient to offer assistance regarding upcoming cardiac imaging study; pt verbalizes understanding of appt date/time, parking situation and where to check in, pre-test NPO status and medications ordered, and verified current allergies; name and call back number provided for further questions should they arise Marchia Bond RN Navigator Cardiac Imaging [facility] [phone] office [phone] cell   Pt did not receive rx for prednisone as contrast allergy prep. Will send to her pharm on file and send her a mychart message with instructions per her request. I encouraged a callback if she had any further questions. [name]

## 2020-10-04 ENCOUNTER — Telehealth (HOSPITAL_COMMUNITY): Payer: Self-pay | Admitting: Emergency Medicine

## 2020-10-04 NOTE — Telephone Encounter (Signed)
Reaching out to patient to offer assistance regarding upcoming cardiac imaging study; pt verbalizes understanding of appt date/time, parking situation and where to check in, pre-test NPO status and medications ordered, and verified current allergies; name and call back number provided for further questions should they arise Virginia Bond RN Navigator Cardiac Imaging [facility] [phone] office [phone] cell  Called patient to check in to see if she was able to pick up contrast allergy prep meds which she verified that she had.   We reviewed the exact times to take those meds and she denies further questions.Virginia Barron

## 2020-10-05 ENCOUNTER — Encounter: Payer: Self-pay | Admitting: *Deleted

## 2020-10-05 ENCOUNTER — Other Ambulatory Visit: Payer: Self-pay

## 2020-10-05 ENCOUNTER — Ambulatory Visit (HOSPITAL_COMMUNITY)
Admission: RE | Admit: 2020-10-05 | Discharge: 2020-10-05 | Disposition: A | Discharge status: Home / Self Care | Payer: 59 | Source: Ambulatory Visit | Attending: Cardiology | Admitting: Cardiology

## 2020-10-05 DIAGNOSIS — Z006 Encounter for examination for normal comparison and control in clinical research program: Secondary | ICD-10-CM

## 2020-10-05 DIAGNOSIS — R0602 Shortness of breath: Secondary | ICD-10-CM | POA: Diagnosis present

## 2020-10-05 DIAGNOSIS — R072 Precordial pain: Secondary | ICD-10-CM | POA: Insufficient documentation

## 2020-10-05 DIAGNOSIS — I10 Essential (primary) hypertension: Secondary | ICD-10-CM | POA: Diagnosis present

## 2020-10-05 MED ORDER — NITROGLYCERIN 0.4 MG SL SUBL
SUBLINGUAL_TABLET | SUBLINGUAL | Status: AC
  Filled 2020-10-05: qty 2

## 2020-10-05 MED ORDER — NITROGLYCERIN 0.4 MG SL SUBL
0.8000 mg | SUBLINGUAL_TABLET | Freq: Once | SUBLINGUAL | Status: AC
  Administered 2020-10-05: 0.8 mg via SUBLINGUAL

## 2020-10-05 MED ORDER — IOHEXOL 350 MG/ML SOLN
100.0000 mL | Freq: Once | INTRAVENOUS | Status: AC | PRN
  Administered 2020-10-05: 100 mL via INTRAVENOUS

## 2020-10-05 NOTE — Research (Signed)
CADFEM Informed Consent                  Subject Name:   Virginia Barron   Subject met inclusion and exclusion criteria.  The informed consent form, study requirements and expectations were reviewed with the subject and questions and concerns were addressed prior to the signing of the consent form.  The subject verbalized understanding of the trial requirements.  The subject agreed to participate in the CADFEM trial and signed the informed consent.  The informed consent was obtained prior to performance of any protocol-specific procedures for the subject.  A copy of the signed informed consent was given to the subject and a copy was placed in the subject's medical record.   [place] Terrie Grajales, Research Assistant  10/05/2020  12:25 p.m.

## 2020-10-11 ENCOUNTER — Encounter: Payer: Self-pay | Admitting: Family Medicine

## 2020-10-23 ENCOUNTER — Ambulatory Visit (INDEPENDENT_AMBULATORY_CARE_PROVIDER_SITE_OTHER): Payer: 59 | Admitting: Family Medicine

## 2020-10-23 ENCOUNTER — Encounter: Payer: Self-pay | Admitting: Family Medicine

## 2020-10-23 ENCOUNTER — Other Ambulatory Visit: Payer: Self-pay

## 2020-10-23 VITALS — BP 134/72 | HR 84 | Temp 97.4°F | Resp 18 | Ht 64.0 in | Wt 292.0 lb

## 2020-10-23 DIAGNOSIS — Z6841 Body Mass Index (BMI) 40.0 and over, adult: Secondary | ICD-10-CM | POA: Diagnosis not present

## 2020-10-23 DIAGNOSIS — Z90710 Acquired absence of both cervix and uterus: Secondary | ICD-10-CM

## 2020-10-23 DIAGNOSIS — Z0000 Encounter for general adult medical examination without abnormal findings: Secondary | ICD-10-CM | POA: Diagnosis not present

## 2020-10-23 NOTE — Progress Notes (Signed)
Subjective:  Patient ID: Virginia Barron, female    DOB: 10-05-1961  Age: 59 y.o. MRN: [id number]  Chief Complaint  Patient presents with  . Annual Exam    HPI Well Adult Physical: Patient here for a comprehensive physical exam.The patient reports no problems Do you take any herbs or supplements that were not prescribed by a doctor? yes -QUERECAN. ALL PURPOSE ANTIINFLAMMATORY.   Are you taking calcium supplements? MVI AND VITAMIN D 200 IN DAY Are you taking aspirin daily? no   Encounter for general adult medical examination without abnormal findings  Physical ("At Risk" items are starred): Patient's last physical exam was 1 year ago .  Smoking: Life-long non-smoker ;  Physical Activity: Exercises at least 3 times per week ;  Alcohol/Drug Use: Is a non-drinker ; No illicit drug use ;  Patient is not afflicted from Stress Incontinence and Urge Incontinence  Safety: reviewed. Patient wears a seat belt, has smoke detectors, practices and wears sunscreen with extended sun exposure. Dental Care:  brushes and flosses daily. Ophthalmology/Optometry: Annual visit.  Hearing loss: none Vision impairments: none  Menarche: 13 Menstrual History: IRREGULAR. MENOPAUSAL SINCE 2009 WHEN SHE UNDERWENT A PARTAIL HYSTERECTOMY FOR FIBROIDS.  PAP WAS ABNORMAL. REPEAT WAS NORMAL.  Pregnancy history: [id number] Safe at home: yes.  Self breast exams: yes. No concerns.  DEXA NOT NEEDED.   08/21/2020 MAMMOGRAM NORMAL.     Office Visit from 10/23/2020 in [place]  PHQ-2 Total Score 2      Social Hx   Social History   Socioeconomic History  . Marital status: Divorced    Spouse name: Not on file  . Number of children: Not on file  . Years of education: Not on file  . Highest education level: Not on file  Occupational History  . Not on file  Tobacco Use  . Smoking status: Never Smoker  . Smokeless tobacco: Never Used  Substance and Sexual Activity  . Alcohol use: Yes     Alcohol/week: 1.0 standard drink    Types: 1 Standard drinks or equivalent per week    Comment: per month  . Drug use: Never  . Sexual activity: Not on file  Other Topics Concern  . Not on file  Social History Narrative  . Not on file   Social Determinants of Health   Financial Resource Strain:   . Difficulty of Paying Living Expenses: Not on file  Food Insecurity:   . Worried About Charity fundraiser in the Last Year: Not on file  . Ran Out of Food in the Last Year: Not on file  Transportation Needs:   . Lack of Transportation (Medical): Not on file  . Lack of Transportation (Non-Medical): Not on file  Physical Activity:   . Days of Exercise per Week: Not on file  . Minutes of Exercise per Session: Not on file  Stress:   . Feeling of Stress : Not on file  Social Connections:   . Frequency of Communication with Friends and Family: Not on file  . Frequency of Social Gatherings with Friends and Family: Not on file  . Attends Religious Services: Not on file  . Active Member of Clubs or Organizations: Not on file  . Attends Archivist Meetings: Not on file  . Marital Status: Not on file   Past Medical History:  Diagnosis Date  . Atypical chest pain 07/30/2020  . Fibroids   . Morbid obesity ([place])   . Morbid obesity with  body mass index (BMI) of 50.0 to 59.9 in adult [facility]) 07/30/2020  . OSA (obstructive sleep apnea) 07/30/2020  . OSA on CPAP 2021  . Urge incontinence 07/30/2020   Past Surgical History:  Procedure Laterality Date  . ABDOMINAL HYSTERECTOMY     precancerous cervical cells and fibroids.  . conescopy     abnormal pap  . LASIK    . TUBAL LIGATION      Family History  Problem Relation Age of Onset  . Diabetes Mother   . Diabetes Father   . Heart attack Father   . Diabetes Sister     Review of Systems  Constitutional: Negative for chills, fatigue and fever.  HENT: Negative for congestion, ear pain and sore throat.   Respiratory: Positive for  shortness of breath. Negative for cough.   Cardiovascular: Negative for chest pain.  Gastrointestinal: Negative for abdominal pain, constipation, diarrhea, nausea and vomiting.  Genitourinary: Negative for dysuria and urgency.  Musculoskeletal: Negative for arthralgias and myalgias.  Skin: Negative for rash.  Neurological: Negative for dizziness and headaches.  Psychiatric/Behavioral: Negative for dysphoric mood. The patient is not nervous/anxious.      Objective:  BP 134/72   Pulse 84   Temp (!) 97.4 F (36.3 C)   Resp 18   Ht 5\' 4"  (1.626 m)   Wt 292 lb (132.5 kg)   BMI 50.12 kg/m   BP/Weight 10/23/2020 10/05/2020 82/04/538  Systolic BP 767 341 937  Diastolic BP 72 83 91  Wt. (Lbs) 292 - 301.6  BMI 50.12 - 51.77    Physical Exam Vitals reviewed.  Constitutional:      General: She is not in acute distress.    Appearance: Normal appearance. She is obese.  HENT:     Right Ear: Tympanic membrane and ear canal normal.     Left Ear: Tympanic membrane and ear canal normal.     Nose: Nose normal. No congestion or rhinorrhea.  Eyes:     Conjunctiva/sclera: Conjunctivae normal.  Neck:     Thyroid: No thyroid mass.  Cardiovascular:     Rate and Rhythm: Normal rate and regular rhythm.     Pulses: Normal pulses.     Heart sounds: No murmur heard.   Pulmonary:     Effort: Pulmonary effort is normal.     Breath sounds: Normal breath sounds.  Abdominal:     General: Bowel sounds are normal.     Palpations: Abdomen is soft. There is no mass.     Tenderness: There is no abdominal tenderness.  Musculoskeletal:        General: Normal range of motion.  Lymphadenopathy:     Cervical: No cervical adenopathy.  Skin:    General: Skin is warm and dry.  Neurological:     Mental Status: She is alert and oriented to person, place, and time.     Cranial Nerves: No cranial nerve deficit.  Psychiatric:        Mood and Affect: Mood normal.        Behavior: Behavior normal.      Lab Results  Component Value Date   WBC 8.4 07/27/2020   HGB 14.6 07/27/2020   HCT 46.5 07/27/2020   PLT 293 07/27/2020   GLUCOSE 90 09/14/2020   CHOL 132 08/15/2020   TRIG 89 08/15/2020   HDL 36 (L) 08/15/2020   LDLCALC 79 08/15/2020   ALT 21 07/27/2020   AST 25 07/27/2020   NA 143 09/14/2020   K  3.5 09/14/2020   CL 104 09/14/2020   CREATININE 1.02 (H) 09/14/2020   BUN 13 09/14/2020   CO2 26 09/14/2020   TSH 1.230 07/27/2020      Assessment & Plan:  1. Routine medical exam 2. Acquired absence of both cervix and uterus 3. Morbid obesity with body mass index (BMI) of 50.0 to 59.9 in adult [facility])   Body mass index is 50.12 kg/m.   These are the goals we discussed: Goals   Recommend continue to work on eating healthy diet and exercise. Recommended TDAP, shingrix vaccinations. Recommended work on advanced directives.  Recommend colonoscopy or cologuard. Pt to think about it.       This is a list of the screening recommended for you and due dates:  Health Maintenance  Topic Date Due  .  Hepatitis C: One time screening is recommended by Center for Disease Control  (CDC) for  adults born from [birth year] through 1966.   Never done  . HIV Screening  Never done  . Tetanus Vaccine  Never done  . Pap Smear  Never done  . Colon Cancer Screening  Never done  . Mammogram  08/17/2022  . Flu Shot  Completed  . COVID-19 Vaccine  Completed     AN INDIVIDUALIZED CARE PLAN: was established or reinforced today.   SELF MANAGEMENT: The patient and I together assessed ways to personally work towards obtaining the recommended goals  Support needs The patient and/or family needs were assessed and services were offered if appropriate.    Follow-up: Return in about 2 months (around 12/27/2020) for FASTING.  An After Visit Summary was printed and given to the patient.  [name], MD [facility] [phone]

## 2020-10-23 NOTE — Patient Instructions (Signed)
Preventive Care 56-60 Years Old, Female Preventive care refers to visits with your health care provider and lifestyle choices that can promote health and wellness. This includes:  A yearly physical exam. This may also be called an annual well check.  Regular dental visits and eye exams.  Immunizations.  Screening for certain conditions.  Healthy lifestyle choices, such as eating a healthy diet, getting regular exercise, not using drugs or products that contain nicotine and tobacco, and limiting alcohol use. What can I expect for my preventive care visit? Physical exam Your health care provider will check your:  Height and weight. This may be used to calculate body mass index (BMI), which tells if you are at a healthy weight.  Heart rate and blood pressure.  Skin for abnormal spots. Counseling Your health care provider may ask you questions about your:  Alcohol, tobacco, and drug use.  Emotional well-being.  Home and relationship well-being.  Sexual activity.  Eating habits.  Work and work Statistician.  Method of birth control.  Menstrual cycle.  Pregnancy history. What immunizations do I need?  Influenza (flu) vaccine  This is recommended every year. Tetanus, diphtheria, and pertussis (Tdap) vaccine  You may need a Td booster every 10 years. Varicella (chickenpox) vaccine  You may need this if you have not been vaccinated. Zoster (shingles) vaccine  You may need this after age 46. Measles, mumps, and rubella (MMR) vaccine  You may need at least one dose of MMR if you were born in 1957 or later. You may also need a second dose. Pneumococcal conjugate (PCV13) vaccine  You may need this if you have certain conditions and were not previously vaccinated. Pneumococcal polysaccharide (PPSV23) vaccine  You may need one or two doses if you smoke cigarettes or if you have certain conditions. Meningococcal conjugate (MenACWY) vaccine  You may need this if  you have certain conditions. Hepatitis A vaccine  You may need this if you have certain conditions or if you travel or work in places where you may be exposed to hepatitis A. Hepatitis B vaccine  You may need this if you have certain conditions or if you travel or work in places where you may be exposed to hepatitis B. Haemophilus influenzae type b (Hib) vaccine  You may need this if you have certain conditions. Human papillomavirus (HPV) vaccine  If recommended by your health care provider, you may need three doses over 6 months. You may receive vaccines as individual doses or as more than one vaccine together in one shot (combination vaccines). Talk with your health care provider about the risks and benefits of combination vaccines. What tests do I need? Blood tests  Lipid and cholesterol levels. These may be checked every 5 years, or more frequently if you are over 62 years old.  Hepatitis C test.  Hepatitis B test. Screening  Lung cancer screening. You may have this screening every year starting at age 8 if you have a 30-pack-year history of smoking and currently smoke or have quit within the past 15 years.  Colorectal cancer screening. All adults should have this screening starting at age 30 and continuing until age 97. Your health care provider may recommend screening at age 66 if you are at increased risk. You will have tests every 1-10 years, depending on your results and the type of screening test.  Diabetes screening. This is done by checking your blood sugar (glucose) after you have not eaten for a while (fasting). You may have  this done every 1-3 years.  Mammogram. This may be done every 1-2 years. Talk with your health care provider about when you should start having regular mammograms. This may depend on whether you have a family history of breast cancer.  BRCA-related cancer screening. This may be done if you have a family history of breast, ovarian, tubal, or  peritoneal cancers.  Pelvic exam and Pap test. This may be done every 3 years starting at age 21. Starting at age 30, this may be done every 5 years if you have a Pap test in combination with an HPV test. Other tests  Sexually transmitted disease (STD) testing.  Bone density scan. This is done to screen for osteoporosis. You may have this scan if you are at high risk for osteoporosis. Follow these instructions at home: Eating and drinking  Eat a diet that includes fresh fruits and vegetables, whole grains, lean protein, and low-fat dairy.  Take vitamin and mineral supplements as recommended by your health care provider.  Do not drink alcohol if: ? Your health care provider tells you not to drink. ? You are pregnant, may be pregnant, or are planning to become pregnant.  If you drink alcohol: ? Limit how much you have to 0-1 drink a day. ? Be aware of how much alcohol is in your drink. In the [place] one drink equals one 12 oz bottle of beer (355 mL), one 5 oz glass of wine (148 mL), or one 1 oz glass of hard liquor (44 mL). Lifestyle  Take daily care of your teeth and gums.  Stay active. Exercise for at least 30 minutes on 5 or more days each week.  Do not use any products that contain nicotine or tobacco, such as cigarettes, e-cigarettes, and chewing tobacco. If you need help quitting, ask your health care provider.  If you are sexually active, practice safe sex. Use a condom or other form of birth control (contraception) in order to prevent pregnancy and STIs (sexually transmitted infections).  If told by your health care provider, take low-dose aspirin daily starting at age 50. What's next?  Visit your health care provider once a year for a well check visit.  Ask your health care provider how often you should have your eyes and teeth checked.  Stay up to date on all vaccines. This information is not intended to replace advice given to you by your health care provider. Make  sure you discuss any questions you have with your health care provider. Document Revised: 08/06/2018 Document Reviewed: 08/06/2018 Elsevier Patient Education  2020 [employer] Inc.  

## 2020-11-12 ENCOUNTER — Encounter: Payer: Self-pay | Admitting: Family Medicine

## 2020-11-13 ENCOUNTER — Other Ambulatory Visit: Payer: Self-pay

## 2020-11-13 ENCOUNTER — Telehealth: Payer: Self-pay

## 2020-11-13 NOTE — Telephone Encounter (Signed)
Attempted to contact patient- unable to leave a message.

## 2020-11-13 NOTE — Telephone Encounter (Signed)
-----   Message from [name], MD sent at 11/12/2020 10:11 PM EST ----- Regarding: Colon Cancer screening Please call pt and ask if she has had a colonoscopy previously. I did not document this in my physical with her. I would recommend colonoscopy or cologuard test.

## 2020-11-15 ENCOUNTER — Other Ambulatory Visit: Payer: Self-pay

## 2020-11-15 ENCOUNTER — Encounter: Payer: Self-pay | Admitting: Cardiology

## 2020-11-15 ENCOUNTER — Ambulatory Visit (INDEPENDENT_AMBULATORY_CARE_PROVIDER_SITE_OTHER): Payer: 59 | Admitting: Cardiology

## 2020-11-15 VITALS — BP 150/86 | HR 80 | Ht 64.0 in | Wt 300.2 lb

## 2020-11-15 DIAGNOSIS — Z6841 Body Mass Index (BMI) 40.0 and over, adult: Secondary | ICD-10-CM | POA: Diagnosis not present

## 2020-11-15 DIAGNOSIS — I10 Essential (primary) hypertension: Secondary | ICD-10-CM

## 2020-11-15 DIAGNOSIS — G4733 Obstructive sleep apnea (adult) (pediatric): Secondary | ICD-10-CM | POA: Diagnosis not present

## 2020-11-15 MED ORDER — AMLODIPINE BESYLATE 5 MG PO TABS: 5.0000 mg | ORAL_TABLET | Freq: Every day | ORAL | 3 refills | Status: DC

## 2020-11-15 NOTE — Patient Instructions (Signed)
Medication Instructions:  Your physician has recommended you make the following change in your medication:   Start Amlodipine 5 mg daily.  *If you need a refill on your cardiac medications before your next appointment, please call your pharmacy*   Lab Work: None ordered If you have labs (blood work) drawn today and your tests are completely normal, you will receive your results only by: Marland Kitchen MyChart Message (if you have MyChart) OR . A paper copy in the mail If you have any lab test that is abnormal or we need to change your treatment, we will call you to review the results.   Testing/Procedures: None ordered   Follow-Up: At [facility], you and your health needs are our priority.  As part of our continuing mission to provide you with exceptional heart care, we have created designated Provider Care Teams.  These Care Teams include your primary Cardiologist (physician) and Advanced Practice Providers (APPs -  Physician Assistants and Nurse Practitioners) who all work together to provide you with the care you need, when you need it.  We recommend signing up for the patient portal called "MyChart".  Sign up information is provided on this After Visit Summary.  MyChart is used to connect with patients for Virtual Visits (Telemedicine).  Patients are able to view lab/test results, encounter notes, upcoming appointments, etc.  Non-urgent messages can be sent to your provider as well.   To learn more about what you can do with MyChart, go to [email or web address].    Your next appointment:   4 week(s)  The format for your next appointment:   In Person  Provider:   [name], DO   Other Instructions NA

## 2020-11-15 NOTE — Progress Notes (Signed)
Cardiology Office Note:    Date:  34/12/9377   ID:  Tamesha Luby, DOB [id number], MRN [id number]  PCP:  [name], MD  Cardiologist:  [name], DO  Electrophysiologist:  None   Referring MD: [name], MD   " I am doing well"  History of Present Illness:    Virginia Barron is a 59 y.o. female with a hx of morbid obesity, hypertension, OSA not using her CPAP recently due to reporting a box has been entered and has been working on getting a new one comes today for follow-up visit. Did see the patient on September 14, 2020 at that time she was still experiencing some intermittent squeezing pain in sensation.  Given your family history I recommended the patient undergo a coronary CTA.  She did get her coronary CTA and the results were called out to the patient which showed 0 calcium with no evidence of coronary artery disease.  She is here today for follow-up visit she does not have any complaint of chest pain.  She does not appear to be very sleepy.  He also tells me that she is thinking about getting into research study about blood pressure.  Past Medical History:  Diagnosis Date  . Atypical chest pain 07/30/2020  . Fibroids   . Morbid obesity ([place])   . Morbid obesity with body mass index (BMI) of 50.0 to 59.9 in adult [facility]) 07/30/2020  . OSA (obstructive sleep apnea) 07/30/2020  . OSA on CPAP 2021  . Urge incontinence 07/30/2020    Past Surgical History:  Procedure Laterality Date  . ABDOMINAL HYSTERECTOMY     precancerous cervical cells and fibroids.  . conescopy     abnormal pap  . LASIK    . TUBAL LIGATION      Current Medications: Current Meds  Medication Sig  . aspirin-acetaminophen-caffeine (EXCEDRIN MIGRAINE) 250-250-65 MG tablet Take 1 tablet by mouth every 6 (six) hours as needed for headache.  . cholecalciferol (VITAMIN D3) 25 MCG (1000 UNIT) tablet Take 400 Units by mouth daily.   . diphenhydrAMINE (BENADRYL) 25 MG tablet Take 25 mg by mouth as needed.   . hydrochlorothiazide (HYDRODIURIL) 25 MG tablet Take 1 tablet (25 mg total) by mouth daily.  . Multiple Vitamin (MULTIVITAMIN) tablet Take 1 tablet by mouth daily.  . nitroGLYCERIN (NITROSTAT) 0.4 MG SL tablet Place 1 tablet (0.4 mg total) under the tongue every 5 (five) minutes as needed for chest pain.     Allergies:   Avocado, Iodine, Penicillins, Pork-derived products, Shellfish allergy, and Latex   Social History   Socioeconomic History  . Marital status: Divorced    Spouse name: Not on file  . Number of children: Not on file  . Years of education: Not on file  . Highest education level: Not on file  Occupational History  . Not on file  Tobacco Use  . Smoking status: Never Smoker  . Smokeless tobacco: Never Used  Substance and Sexual Activity  . Alcohol use: Yes    Alcohol/week: 1.0 standard drink    Types: 1 Standard drinks or equivalent per week    Comment: per month  . Drug use: Never  . Sexual activity: Not on file  Other Topics Concern  . Not on file  Social History Narrative  . Not on file   Social Determinants of Health   Financial Resource Strain:   . Difficulty of Paying Living Expenses: Not on file  Food Insecurity:   . Worried About Running  Out of Food in the Last Year: Not on file  . Ran Out of Food in the Last Year: Not on file  Transportation Needs:   . Lack of Transportation (Medical): Not on file  . Lack of Transportation (Non-Medical): Not on file  Physical Activity:   . Days of Exercise per Week: Not on file  . Minutes of Exercise per Session: Not on file  Stress:   . Feeling of Stress : Not on file  Social Connections:   . Frequency of Communication with Friends and Family: Not on file  . Frequency of Social Gatherings with Friends and Family: Not on file  . Attends Religious Services: Not on file  . Active Member of Clubs or Organizations: Not on file  . Attends Archivist Meetings: Not on file  . Marital Status: Not on file      Family History: The patient's family history includes Diabetes in her father, mother, and sister; Heart attack in her father.  ROS:   Review of Systems  Constitution: Negative for decreased appetite, fever and weight gain.  HENT: Negative for congestion, ear discharge, hoarse voice and sore throat.   Eyes: Negative for discharge, redness, vision loss in right eye and visual halos.  Cardiovascular: Negative for chest pain, dyspnea on exertion, leg swelling, orthopnea and palpitations.  Respiratory: Negative for cough, hemoptysis, shortness of breath and snoring.   Endocrine: Negative for heat intolerance and polyphagia.  Hematologic/Lymphatic: Negative for bleeding problem. Does not bruise/bleed easily.  Skin: Negative for flushing, nail changes, rash and suspicious lesions.  Musculoskeletal: Negative for arthritis, joint pain, muscle cramps, myalgias, neck pain and stiffness.  Gastrointestinal: Negative for abdominal pain, bowel incontinence, diarrhea and excessive appetite.  Genitourinary: Negative for decreased libido, genital sores and incomplete emptying.  Neurological: Negative for brief paralysis, focal weakness, headaches and loss of balance.  Psychiatric/Behavioral: Negative for altered mental status, depression and suicidal ideas.  Allergic/Immunologic: Negative for HIV exposure and persistent infections.    EKGs/Labs/Other Studies Reviewed:    The following studies were reviewed today:   EKG: None today.  Coronary CTA Aorta: Normal size.  No calcifications.  No dissection.  Aortic Valve:  Trileaflet.  No calcifications.  Coronary Arteries:  Normal coronary origin.  Right dominance.  RCA is a large dominant artery that gives rise to PDA and PLVB. There is no plaque.  Left main is a large artery that gives rise to LAD and LCX arteries.  LAD is a large vessel that has no plaque. There is small area of mild myocardial bridging in the mid LAD.  LCX is a  non-dominant artery that gives rise to one large OM1 branch. There is no plaque.  Other findings:  Normal pulmonary vein drainage into the left atrium.  Normal left atrial appendage without a thrombus.  Normal size of the pulmonary artery.  IMPRESSION: 1. Coronary calcium score of 0. This was 0 percentile for age and sex matched control.  2. Normal coronary origin with right dominance.  3. No evidence of CAD.  4. Mild Mid LAD myocardial bridging.   Transthoracic echocardiogram 1. Left ventricular ejection fraction, by estimation, is 60 to 65%. The left ventricle has normal function. The left ventricle has no regional wall motion abnormalities. There is severe concentric left ventricular hypertrophy. Left ventricular diastolic  parameters are consistent with Grade I diastolic dysfunction (impaired  relaxation).  2. Right ventricular systolic function is normal. The right ventricular  size is normal. There is normal pulmonary  artery systolic pressure.  3. The mitral valve is normal in structure. No evidence of mitral valve  regurgitation. No evidence of mitral stenosis.  4. The aortic valve is normal in structure. Aortic valve regurgitation is  not visualized. No aortic stenosis is present.  5. The inferior vena cava is normal in size with greater than 50%  respiratory variability, suggesting right atrial pressure of 3 mmHg.   Recent Labs: 07/27/2020: ALT 21; Hemoglobin 14.6; Platelets 293; TSH 1.230 09/14/2020: BUN 13; Creatinine, Ser 1.02; Magnesium 2.2; Potassium 3.5; Sodium 143  Recent Lipid Panel    Component Value Date/Time   CHOL 132 08/15/2020 1507   TRIG 89 08/15/2020 1507   HDL 36 (L) 08/15/2020 1507   CHOLHDL 3.7 08/15/2020 1507   LDLCALC 79 08/15/2020 1507    Physical Exam:    VS:  BP (!) 150/86   Pulse 80   Ht 5\' 4"  (1.626 m)   Wt (!) 300 lb 3.2 oz (136.2 kg)   SpO2 97%   BMI 51.53 kg/m     Wt Readings from Last 3 Encounters:  11/15/20  (!) 300 lb 3.2 oz (136.2 kg)  10/23/20 292 lb (132.5 kg)  09/14/20 (!) 301 lb 9.6 oz (136.8 kg)     GEN: Well nourished, well developed in no acute distress HEENT: Normal NECK: No JVD; No carotid bruits LYMPHATICS: No lymphadenopathy CARDIAC: S1S2 noted,RRR, no murmurs, rubs, gallops RESPIRATORY:  Clear to auscultation without rales, wheezing or rhonchi  ABDOMEN: Soft, non-tender, non-distended, +bowel sounds, no guarding. EXTREMITIES: No edema, No cyanosis, no clubbing MUSCULOSKELETAL:  No deformity  SKIN: Warm and dry NEUROLOGIC:  Alert and oriented x 3, non-focal PSYCHIATRIC:  Normal affect, good insight  ASSESSMENT:    1. Essential hypertension   2. OSA (obstructive sleep apnea)   3. Morbid obesity with body mass index (BMI) of 50.0 to 59.9 in adult [facility])    PLAN:     Her blood pressure is elevated in the office today.  She is currently on hydrochlorothiazide 25 mg twice daily.  Her target is less than 130/80 mmHg.  Going to add amlodipine 5 mg to her regimen. She is going to send me information about the blood pressure study as she would like me to take a look at it for any guidance.  We again discussed her CTA results and her echo results.  All of her questions has been answered.  The patient understands the need to lose weight with diet and exercise. We have discussed specific strategies for this.  The patient is in agreement with the above plan. The patient left the office in stable condition.  The patient will follow up in   Medication Adjustments/Labs and Tests Ordered: Current medicines are reviewed at length with the patient today.  Concerns regarding medicines are outlined above.  No orders of the defined types were placed in this encounter.  Meds ordered this encounter  Medications  . amLODipine (NORVASC) 5 MG tablet    Sig: Take 1 tablet (5 mg total) by mouth daily.    Dispense:  30 tablet    Refill:  3    Patient Instructions  Medication Instructions:   Your physician has recommended you make the following change in your medication:   Start Amlodipine 5 mg daily.  *If you need a refill on your cardiac medications before your next appointment, please call your pharmacy*   Lab Work: None ordered If you have labs (blood work) drawn today and your tests  are completely normal, you will receive your results only by: Marland Kitchen MyChart Message (if you have MyChart) OR . A paper copy in the mail If you have any lab test that is abnormal or we need to change your treatment, we will call you to review the results.   Testing/Procedures: None ordered   Follow-Up: At [facility], you and your health needs are our priority.  As part of our continuing mission to provide you with exceptional heart care, we have created designated Provider Care Teams.  These Care Teams include your primary Cardiologist (physician) and Advanced Practice Providers (APPs -  Physician Assistants and Nurse Practitioners) who all work together to provide you with the care you need, when you need it.  We recommend signing up for the patient portal called "MyChart".  Sign up information is provided on this After Visit Summary.  MyChart is used to connect with patients for Virtual Visits (Telemedicine).  Patients are able to view lab/test results, encounter notes, upcoming appointments, etc.  Non-urgent messages can be sent to your provider as well.   To learn more about what you can do with MyChart, go to [email or web address].    Your next appointment:   4 week(s)  The format for your next appointment:   In Person  Provider:   [name], DO   Other Instructions NA     Adopting a Healthy Lifestyle.  Know what a healthy weight is for you (roughly BMI <25) and aim to maintain this   Aim for 7+ servings of fruits and vegetables daily   65-80+ fluid ounces of water or unsweet tea for healthy kidneys   Limit to max 1 drink of alcohol per day; avoid  smoking/tobacco   Limit animal fats in diet for cholesterol and heart health - choose grass fed whenever available   Avoid highly processed foods, and foods high in saturated/trans fats   Aim for low stress - take time to unwind and care for your mental health   Aim for 150 min of moderate intensity exercise weekly for heart health, and weights twice weekly for bone health   Aim for 7-9 hours of sleep daily   When it comes to diets, agreement about the perfect plan isnt easy to find, even among the experts. Experts at the [employer] developed an idea known as the Healthy Eating Plate. Just imagine a plate divided into logical, healthy portions.   The emphasis is on diet quality:   Load up on vegetables and fruits - one-half of your plate: Aim for color and variety, and remember that potatoes dont count.   Go for whole grains - one-quarter of your plate: Whole wheat, barley, wheat berries, quinoa, oats, brown rice, and foods made with them. If you want pasta, go with whole wheat pasta.   Protein power - one-quarter of your plate: Fish, chicken, beans, and nuts are all healthy, versatile protein sources. Limit red meat.   The diet, however, does go beyond the plate, offering a few other suggestions.   Use healthy plant oils, such as olive, canola, soy, corn, sunflower and peanut. Check the labels, and avoid partially hydrogenated oil, which have unhealthy trans fats.   If youre thirsty, drink water. Coffee and tea are good in moderation, but skip sugary drinks and limit milk and dairy products to one or two daily servings.   The type of carbohydrate in the diet is more important than the amount. Some sources of carbohydrates, such  as vegetables, fruits, whole grains, and beans-are healthier than others.   Finally, stay active  Signed, [name], DO  11/15/2020 2:01 PM    [place]

## 2020-12-13 ENCOUNTER — Ambulatory Visit (INDEPENDENT_AMBULATORY_CARE_PROVIDER_SITE_OTHER): Payer: 59 | Admitting: Cardiology

## 2020-12-13 ENCOUNTER — Other Ambulatory Visit: Payer: Self-pay

## 2020-12-13 ENCOUNTER — Encounter: Payer: Self-pay | Admitting: Cardiology

## 2020-12-13 VITALS — BP 130/78 | HR 89 | Ht 64.0 in | Wt 301.6 lb

## 2020-12-13 DIAGNOSIS — Z9989 Dependence on other enabling machines and devices: Secondary | ICD-10-CM

## 2020-12-13 DIAGNOSIS — G4733 Obstructive sleep apnea (adult) (pediatric): Secondary | ICD-10-CM | POA: Diagnosis not present

## 2020-12-13 DIAGNOSIS — I10 Essential (primary) hypertension: Secondary | ICD-10-CM

## 2020-12-13 MED ORDER — HYDROCHLOROTHIAZIDE 25 MG PO TABS
25.0000 mg | ORAL_TABLET | Freq: Every day | ORAL | 11 refills | Status: DC
Start: 2020-12-13 — End: 2022-01-14

## 2020-12-13 MED ORDER — AMLODIPINE BESYLATE 5 MG PO TABS
5.0000 mg | ORAL_TABLET | Freq: Every day | ORAL | 11 refills | Status: DC
Start: 2020-12-13 — End: 2022-01-14

## 2020-12-13 NOTE — Progress Notes (Signed)
Cardiology Office Note:    Date:  [id number]   ID:  Virginia Barron, DOB [id number], MRN [id number]  PCP:  [name], MD  Cardiologist:  [name], DO  Electrophysiologist:  None   Referring MD: [name], MD   " I am doing well"   History of Present Illness:    Virginia Barron is a 60 y.o. female with a hx of hypertension, obstructive sleep apnea morbid obesity is here today for follow-up visit.  I did see the patient on November 15, 2020 at that time she still was hypotensive we discussed that her blood pressure goal was less than 130/80.  I added amlodipine 5 mg to her regimen.  In the interim she has been taking hydrochlorothiazide 25 mg daily and amlodipine 5 mg daily.  Her blood pressure at home she says has been staying less than 130.  She also tells me she is undergoing some stressful situation where her apartment is being remodeled and all of her things has been displaced.  No other complaints at this time.  No chest pain or shortness of breath.  Past Medical History:  Diagnosis Date  . Atypical chest pain 07/30/2020  . Fibroids   . Morbid obesity ([place])   . Morbid obesity with body mass index (BMI) of 50.0 to 59.9 in adult [facility]) 07/30/2020  . OSA (obstructive sleep apnea) 07/30/2020  . OSA on CPAP 2021  . Urge incontinence 07/30/2020    Past Surgical History:  Procedure Laterality Date  . ABDOMINAL HYSTERECTOMY     precancerous cervical cells and fibroids.  . conescopy     abnormal pap  . LASIK    . TUBAL LIGATION      Current Medications: Current Meds  Medication Sig  . aspirin-acetaminophen-caffeine (EXCEDRIN MIGRAINE) 250-250-65 MG tablet Take 1 tablet by mouth every 6 (six) hours as needed for headache.  . cholecalciferol (VITAMIN D3) 25 MCG (1000 UNIT) tablet Take 400 Units by mouth daily.   . diphenhydrAMINE (BENADRYL) 25 MG tablet Take 25 mg by mouth as needed.  . Multiple Vitamin (MULTIVITAMIN) tablet Take 1 tablet by mouth daily.  . nitroGLYCERIN  (NITROSTAT) 0.4 MG SL tablet Place 1 tablet (0.4 mg total) under the tongue every 5 (five) minutes as needed for chest pain.  . [DISCONTINUED] amLODipine (NORVASC) 5 MG tablet Take 1 tablet (5 mg total) by mouth daily.  . [DISCONTINUED] hydrochlorothiazide (HYDRODIURIL) 25 MG tablet Take 1 tablet (25 mg total) by mouth daily.     Allergies:   Avocado, Iodine, Penicillins, Pork-derived products, Shellfish allergy, and Latex   Social History   Socioeconomic History  . Marital status: Divorced    Spouse name: Not on file  . Number of children: Not on file  . Years of education: Not on file  . Highest education level: Not on file  Occupational History  . Not on file  Tobacco Use  . Smoking status: Never Smoker  . Smokeless tobacco: Never Used  Substance and Sexual Activity  . Alcohol use: Yes    Alcohol/week: 1.0 standard drink    Types: 1 Standard drinks or equivalent per week    Comment: per month  . Drug use: Never  . Sexual activity: Not on file  Other Topics Concern  . Not on file  Social History Narrative  . Not on file   Social Determinants of Health   Financial Resource Strain: Not on file  Food Insecurity: Not on file  Transportation Needs: Not on file  Physical Activity: Not on file  Stress: Not on file  Social Connections: Not on file     Family History: The patient's family history includes Diabetes in her father, mother, and sister; Heart attack in her father.  ROS:   Review of Systems  Constitution: Negative for decreased appetite, fever and weight gain.  HENT: Negative for congestion, ear discharge, hoarse voice and sore throat.   Eyes: Negative for discharge, redness, vision loss in right eye and visual halos.  Cardiovascular: Negative for chest pain, dyspnea on exertion, leg swelling, orthopnea and palpitations.  Respiratory: Negative for cough, hemoptysis, shortness of breath and snoring.   Endocrine: Negative for heat intolerance and polyphagia.   Hematologic/Lymphatic: Negative for bleeding problem. Does not bruise/bleed easily.  Skin: Negative for flushing, nail changes, rash and suspicious lesions.  Musculoskeletal: Negative for arthritis, joint pain, muscle cramps, myalgias, neck pain and stiffness.  Gastrointestinal: Negative for abdominal pain, bowel incontinence, diarrhea and excessive appetite.  Genitourinary: Negative for decreased libido, genital sores and incomplete emptying.  Neurological: Negative for brief paralysis, focal weakness, headaches and loss of balance.  Psychiatric/Behavioral: Negative for altered mental status, depression and suicidal ideas.  Allergic/Immunologic: Negative for HIV exposure and persistent infections.    EKGs/Labs/Other Studies Reviewed:    The following studies were reviewed today:   EKG: None today  Recent Labs: 07/27/2020: ALT 21; Hemoglobin 14.6; Platelets 293; TSH 1.230 09/14/2020: BUN 13; Creatinine, Ser 1.02; Magnesium 2.2; Potassium 3.5; Sodium 143  Recent Lipid Panel    Component Value Date/Time   CHOL 132 08/15/2020 1507   TRIG 89 08/15/2020 1507   HDL 36 (L) 08/15/2020 1507   CHOLHDL 3.7 08/15/2020 1507   LDLCALC 79 08/15/2020 1507    Physical Exam:    VS:  BP 130/78   Pulse 89   Ht 5\' 4"  (1.626 m)   Wt (!) 301 lb 9.6 oz (136.8 kg)   SpO2 97%   BMI 51.77 kg/m     Wt Readings from Last 3 Encounters:  12/13/20 (!) 301 lb 9.6 oz (136.8 kg)  11/15/20 (!) 300 lb 3.2 oz (136.2 kg)  10/23/20 292 lb (132.5 kg)     GEN: Well nourished, well developed in no acute distress HEENT: Normal NECK: No JVD; No carotid bruits LYMPHATICS: No lymphadenopathy CARDIAC: S1S2 noted,RRR, no murmurs, rubs, gallops RESPIRATORY:  Clear to auscultation without rales, wheezing or rhonchi  ABDOMEN: Soft, non-tender, non-distended, +bowel sounds, no guarding. EXTREMITIES: No edema, No cyanosis, no clubbing MUSCULOSKELETAL:  No deformity  SKIN: Warm and dry NEUROLOGIC:  Alert and  oriented x 3, non-focal PSYCHIATRIC:  Normal affect, good insight  ASSESSMENT:    1. Essential hypertension   2. Morbid obesity (HCC)   3. OSA on CPAP    PLAN:     Her blood pressure is acceptable in the office I am going to continue her current medical regimen which includes hydrochlorothiazide 25 mg daily and amlodipine 5 mg daily.  No changes will be made at this time.  The patient understands the need to lose weight with diet and exercise. We have discussed specific strategies for this.  Continue CPAP  The patient is in agreement with the above plan. The patient left the office in stable condition.  The patient will follow up in   Medication Adjustments/Labs and Tests Ordered: Current medicines are reviewed at length with the patient today.  Concerns regarding medicines are outlined above.  No orders of the defined types were placed in this encounter.  Meds ordered this encounter  Medications  . amLODipine (NORVASC) 5 MG tablet    Sig: Take 1 tablet (5 mg total) by mouth daily.    Dispense:  30 tablet    Refill:  11  . hydrochlorothiazide (HYDRODIURIL) 25 MG tablet    Sig: Take 1 tablet (25 mg total) by mouth daily.    Dispense:  30 tablet    Refill:  11    Patient Instructions  Medication Instructions:  Your physician recommends that you continue on your current medications as directed. Please refer to the Current Medication list given to you today.  *If you need a refill on your cardiac medications before your next appointment, please call your pharmacy*  Follow-Up: At [facility], you and your health needs are our priority.  As part of our continuing mission to provide you with exceptional heart care, we have created designated Provider Care Teams.  These Care Teams include your primary Cardiologist (physician) and Advanced Practice Providers (APPs -  Physician Assistants and Nurse Practitioners) who all work together to provide you with the care you need, when  you need it.   Your next appointment:   6 month(s)  The format for your next appointment:   In Person  Provider:   [name], DO       Adopting a Healthy Lifestyle.  Know what a healthy weight is for you (roughly BMI <25) and aim to maintain this   Aim for 7+ servings of fruits and vegetables daily   65-80+ fluid ounces of water or unsweet tea for healthy kidneys   Limit to max 1 drink of alcohol per day; avoid smoking/tobacco   Limit animal fats in diet for cholesterol and heart health - choose grass fed whenever available   Avoid highly processed foods, and foods high in saturated/trans fats   Aim for low stress - take time to unwind and care for your mental health   Aim for 150 min of moderate intensity exercise weekly for heart health, and weights twice weekly for bone health   Aim for 7-9 hours of sleep daily   When it comes to diets, agreement about the perfect plan isnt easy to find, even among the experts. Experts at the [place] [employer] developed an idea known as the Healthy Eating Plate. Just imagine a plate divided into logical, healthy portions.   The emphasis is on diet quality:   Load up on vegetables and fruits - one-half of your plate: Aim for color and variety, and remember that potatoes dont count.   Go for whole grains - one-quarter of your plate: Whole wheat, barley, wheat berries, quinoa, oats, brown rice, and foods made with them. If you want pasta, go with whole wheat pasta.   Protein power - one-quarter of your plate: Fish, chicken, beans, and nuts are all healthy, versatile protein sources. Limit red meat.   The diet, however, does go beyond the plate, offering a few other suggestions.   Use healthy plant oils, such as olive, canola, soy, corn, sunflower and peanut. Check the labels, and avoid partially hydrogenated oil, which have unhealthy trans fats.   If youre thirsty, drink water. Coffee and tea are good in  moderation, but skip sugary drinks and limit milk and dairy products to one or two daily servings.   The type of carbohydrate in the diet is more important than the amount. Some sources of carbohydrates, such as vegetables, fruits, whole grains, and beans-are healthier than  others.   Finally, stay active  Signed, [name], DO  12/13/2020 3:04 PM    [facility]

## 2020-12-13 NOTE — Patient Instructions (Signed)
Medication Instructions:  Your physician recommends that you continue on your current medications as directed. Please refer to the Current Medication list given to you today.  *If you need a refill on your cardiac medications before your next appointment, please call your pharmacy*  Follow-Up: At [facility], you and your health needs are our priority.  As part of our continuing mission to provide you with exceptional heart care, we have created designated Provider Care Teams.  These Care Teams include your primary Cardiologist (physician) and Advanced Practice Providers (APPs -  Physician Assistants and Nurse Practitioners) who all work together to provide you with the care you need, when you need it.   Your next appointment:   6 month(s)  The format for your next appointment:   In Person  Provider:   Kardie Tobb, DO    

## 2020-12-22 ENCOUNTER — Encounter: Payer: Self-pay | Admitting: Family Medicine

## 2020-12-25 NOTE — Progress Notes (Signed)
Subjective:  Patient ID: Virginia Barron, female    DOB: Feb 13, 1961  Age: 60 y.o. MRN: [id number]  Chief Complaint  Patient presents with  . Hypertension    HPI OSA:  Had sleep study last year which showed she has severe OSA. She has not received her supplies and cpap machine.  HTN: Currently on hctz 25 mg once daily and amlodipine 5 mg once daily.  Migraines: Takes excedrin migraine.  Morbid Obesity: Currently eating as healthy as possible. Lacking vegetables. Does some exercise at home. Complaining of BL knee pain and ankle pain.    Current Outpatient Medications on File Prior to Visit  Medication Sig Dispense Refill  . amLODipine (NORVASC) 5 MG tablet Take 1 tablet (5 mg total) by mouth daily. 30 tablet 11  . aspirin-acetaminophen-caffeine (EXCEDRIN MIGRAINE) 951-884-16 MG tablet Take 1 tablet by mouth every 6 (six) hours as needed for headache.    . cholecalciferol (VITAMIN D3) 25 MCG (1000 UNIT) tablet Take 400 Units by mouth daily.     . diphenhydrAMINE (BENADRYL) 25 MG tablet Take 25 mg by mouth as needed.    . hydrochlorothiazide (HYDRODIURIL) 25 MG tablet Take 1 tablet (25 mg total) by mouth daily. 30 tablet 11  . Multiple Vitamin (MULTIVITAMIN) tablet Take 1 tablet by mouth daily.    . Turmeric (QC TUMERIC COMPLEX PO) Take by mouth daily.    . nitroGLYCERIN (NITROSTAT) 0.4 MG SL tablet Place 1 tablet (0.4 mg total) under the tongue every 5 (five) minutes as needed for chest pain. 90 tablet 3   No current facility-administered medications on file prior to visit.   Past Medical History:  Diagnosis Date  . Atypical chest pain 07/30/2020  . Fibroids   . Morbid obesity ([place])   . Morbid obesity with body mass index (BMI) of 50.0 to 59.9 in adult [facility]) 07/30/2020  . OSA (obstructive sleep apnea) 07/30/2020  . OSA on CPAP 2021  . Urge incontinence 07/30/2020   Past Surgical History:  Procedure Laterality Date  . ABDOMINAL HYSTERECTOMY     precancerous cervical cells and  fibroids.  . conescopy     abnormal pap  . LASIK    . TUBAL LIGATION      Family History  Problem Relation Age of Onset  . Diabetes Mother   . Diabetes Father   . Heart attack Father   . Diabetes Sister    Social History   Socioeconomic History  . Marital status: Divorced    Spouse name: Not on file  . Number of children: Not on file  . Years of education: Not on file  . Highest education level: Not on file  Occupational History  . Not on file  Tobacco Use  . Smoking status: Former Smoker    Quit date: 1980    Years since quitting: 42.1  . Smokeless tobacco: Never Used  Substance and Sexual Activity  . Alcohol use: Yes    Alcohol/week: 1.0 standard drink    Types: 1 Standard drinks or equivalent per week    Comment: per month  . Drug use: Never  . Sexual activity: Not on file  Other Topics Concern  . Not on file  Social History Narrative  . Not on file   Social Determinants of Health   Financial Resource Strain: Not on file  Food Insecurity: Not on file  Transportation Needs: Not on file  Physical Activity: Not on file  Stress: Not on file  Social Connections: Not on file  Review of Systems  Constitutional: Positive for fatigue. Negative for chills and fever.  HENT: Negative for congestion, ear pain, rhinorrhea and sore throat.   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   Cardiovascular: Negative for chest pain.  Gastrointestinal: Positive for constipation (Has BMs every 2 days. Very hard to start and then works better. ). Negative for abdominal pain, diarrhea, nausea and vomiting.  Genitourinary: Negative for dysuria and urgency.       Urge incontinence  Occasionally.   Musculoskeletal: Positive for arthralgias (BL knees and ankles, as well as wrists. Had to move a lot of furniture and feels it is becaue of this. ). Negative for back pain and myalgias.  Neurological: Negative for dizziness,  weakness, light-headedness and headaches.  Psychiatric/Behavioral: Negative for dysphoric mood. The patient is not nervous/anxious.      Objective:  BP 124/70   Pulse 64   Temp 98.3 F (36.8 C)   Resp 18   Ht 5\' 4"  (1.626 m)   Wt 298 lb (135.2 kg)   BMI 51.15 kg/m   BP/Weight 12/26/2020 12/13/2020 [id number]  Systolic BP [id number]  Diastolic BP 70 78 86  Wt. (Lbs) 298 301.6 300.2  BMI 51.15 51.77 51.53    Physical Exam Vitals reviewed.  Constitutional:      Appearance: Normal appearance.  Cardiovascular:     Rate and Rhythm: Normal rate and regular rhythm.     Heart sounds: Normal heart sounds.  Pulmonary:     Effort: Pulmonary effort is normal.     Breath sounds: Normal breath sounds.  Abdominal:     General: Bowel sounds are normal.     Tenderness: There is no abdominal tenderness.  Musculoskeletal:        General: Tenderness (BL knees.) present.     Cervical back: Normal range of motion.  Neurological:     Mental Status: She is alert and oriented to person, place, and time.  Psychiatric:        Mood and Affect: Mood normal.        Behavior: Behavior normal.     Lab Results  Component Value Date   WBC 8.4 07/27/2020   HGB 14.6 07/27/2020   HCT 46.5 07/27/2020   PLT 293 07/27/2020   GLUCOSE 90 09/14/2020   CHOL 132 08/15/2020   TRIG 89 08/15/2020   HDL 36 (L) 08/15/2020   LDLCALC 79 08/15/2020   ALT 21 07/27/2020   AST 25 07/27/2020   NA 143 09/14/2020   K 3.5 09/14/2020   CL 104 09/14/2020   CREATININE 1.02 (H) 09/14/2020   BUN 13 09/14/2020   CO2 26 09/14/2020   TSH 1.230 07/27/2020      Assessment & Plan:   1. OSA (obstructive sleep apnea)  Order supples/cpap.  2. Chronic pain of left knee - DG Knee Complete 4 Views Left  3. Chronic pain of right knee - DG Knee Complete 4 Views Right  4. Colon cancer screening - Ambulatory referral to Gastroenterology  5. Morbid obesity with body mass index (BMI) of 50.0 to 59.9 in adult  [facility]) Recommend healthy diet and exercise.   6. Acquired absence of both cervix and uterus  7. Essential hypertension Well controlled.  The current medical regimen is effective;  continue present plan and medications.  Orders Placed This Encounter  Procedures  . DG Knee Complete 4 Views Left  . DG Knee Complete 4 Views Right  .  Ambulatory referral to Gastroenterology    Follow-up: Return in about 3 months (around 03/26/2021).  An After Visit Summary was printed and given to the patient.  [name], MD [facility] [phone]

## 2020-12-26 ENCOUNTER — Other Ambulatory Visit: Payer: Self-pay

## 2020-12-26 ENCOUNTER — Ambulatory Visit (INDEPENDENT_AMBULATORY_CARE_PROVIDER_SITE_OTHER): Payer: 59 | Admitting: Family Medicine

## 2020-12-26 ENCOUNTER — Encounter: Payer: Self-pay | Admitting: Family Medicine

## 2020-12-26 VITALS — BP 124/70 | HR 64 | Temp 98.3°F | Resp 18 | Ht 64.0 in | Wt 298.0 lb

## 2020-12-26 DIAGNOSIS — M25562 Pain in left knee: Secondary | ICD-10-CM | POA: Diagnosis not present

## 2020-12-26 DIAGNOSIS — Z90710 Acquired absence of both cervix and uterus: Secondary | ICD-10-CM

## 2020-12-26 DIAGNOSIS — Z1211 Encounter for screening for malignant neoplasm of colon: Secondary | ICD-10-CM | POA: Diagnosis not present

## 2020-12-26 DIAGNOSIS — Z6841 Body Mass Index (BMI) 40.0 and over, adult: Secondary | ICD-10-CM

## 2020-12-26 DIAGNOSIS — G8929 Other chronic pain: Secondary | ICD-10-CM

## 2020-12-26 DIAGNOSIS — M25561 Pain in right knee: Secondary | ICD-10-CM

## 2020-12-26 DIAGNOSIS — G4733 Obstructive sleep apnea (adult) (pediatric): Secondary | ICD-10-CM

## 2020-12-26 DIAGNOSIS — I10 Essential (primary) hypertension: Secondary | ICD-10-CM

## 2021-01-07 ENCOUNTER — Encounter: Payer: Self-pay | Admitting: Family Medicine

## 2021-01-11 ENCOUNTER — Encounter: Payer: Self-pay | Admitting: *Deleted

## 2021-01-11 DIAGNOSIS — Z006 Encounter for examination for normal comparison and control in clinical research program: Secondary | ICD-10-CM

## 2021-01-11 NOTE — Research (Signed)
I spoke to patient for 90-day phone call for Cad-Fem study. Patients symptoms are better and has not had to seek anymore medical care for them. I will call patient at 1 year for next study visit. I thanked patient for her participation in the study.

## 2021-03-24 ENCOUNTER — Encounter: Payer: Self-pay | Admitting: Family Medicine

## 2021-04-04 ENCOUNTER — Other Ambulatory Visit: Payer: Self-pay

## 2021-04-04 ENCOUNTER — Ambulatory Visit (INDEPENDENT_AMBULATORY_CARE_PROVIDER_SITE_OTHER): Payer: 59 | Admitting: Family Medicine

## 2021-04-04 VITALS — BP 124/84 | HR 88 | Temp 97.5°F | Resp 16 | Ht 64.0 in | Wt 291.0 lb

## 2021-04-04 DIAGNOSIS — N3941 Urge incontinence: Secondary | ICD-10-CM | POA: Diagnosis not present

## 2021-04-04 DIAGNOSIS — G4733 Obstructive sleep apnea (adult) (pediatric): Secondary | ICD-10-CM | POA: Diagnosis not present

## 2021-04-04 DIAGNOSIS — I10 Essential (primary) hypertension: Secondary | ICD-10-CM | POA: Diagnosis not present

## 2021-04-04 DIAGNOSIS — Z6841 Body Mass Index (BMI) 40.0 and over, adult: Secondary | ICD-10-CM

## 2021-04-04 DIAGNOSIS — Z23 Encounter for immunization: Secondary | ICD-10-CM

## 2021-04-04 DIAGNOSIS — L309 Dermatitis, unspecified: Secondary | ICD-10-CM

## 2021-04-04 DIAGNOSIS — R5383 Other fatigue: Secondary | ICD-10-CM

## 2021-04-04 LAB — POCT URINALYSIS DIPSTICK
Bilirubin, UA: NEGATIVE
Blood, UA: NEGATIVE
Glucose, UA: NEGATIVE
Ketones, UA: NEGATIVE
Leukocytes, UA: NEGATIVE
Nitrite, UA: NEGATIVE
Protein, UA: POSITIVE — AB
Spec Grav, UA: 1.01 (ref 1.010–1.025)
Urobilinogen, UA: 0.2 E.U./dL
pH, UA: 7 (ref 5.0–8.0)

## 2021-04-04 MED ORDER — OXYBUTYNIN CHLORIDE ER 5 MG PO TB24: 5.0000 mg | ORAL_TABLET | Freq: Every day | ORAL | 3 refills | Status: DC

## 2021-04-04 MED ORDER — TRIAMCINOLONE ACETONIDE 0.1 % EX CREA: 1.0000 "application " | TOPICAL_CREAM | Freq: Two times a day (BID) | CUTANEOUS | 0 refills | Status: DC

## 2021-04-04 NOTE — Progress Notes (Signed)
Subjective:  Patient ID: Virginia Barron, female    DOB: December 15, 1960  Age: 60 y.o. MRN: [id number]  Chief Complaint  Patient presents with  . Hypertension  . Sleep Apnea    HPI OSA severe. Sleeps on her side. Working on weight loss. uanble to get CPAP due to cost and insurance refusal to pay for it.   Hypertension: on amlodiine 5 mg once daily and hctz 25 mg once daily. NTG prn. She has taken this once a month at most.   Joint pain (knees, shoulders): takes ibuprofen which helps. Xrays show OA BL knees.   Current Outpatient Medications on File Prior to Visit  Medication Sig Dispense Refill  . amLODipine (NORVASC) 5 MG tablet Take 1 tablet (5 mg total) by mouth daily. 30 tablet 11  . aspirin-acetaminophen-caffeine (EXCEDRIN MIGRAINE) 144-315-40 MG tablet Take 1 tablet by mouth every 6 (six) hours as needed for headache.    . cholecalciferol (VITAMIN D3) 25 MCG (1000 UNIT) tablet Take 400 Units by mouth daily.     . diphenhydrAMINE (BENADRYL) 25 MG tablet Take 25 mg by mouth as needed.    . hydrochlorothiazide (HYDRODIURIL) 25 MG tablet Take 1 tablet (25 mg total) by mouth daily. 30 tablet 11  . Multiple Vitamin (MULTIVITAMIN) tablet Take 1 tablet by mouth daily.    . nitroGLYCERIN (NITROSTAT) 0.4 MG SL tablet Place 1 tablet (0.4 mg total) under the tongue every 5 (five) minutes as needed for chest pain. 90 tablet 3  . Turmeric (QC TUMERIC COMPLEX PO) Take by mouth daily.     No current facility-administered medications on file prior to visit.   Past Medical History:  Diagnosis Date  . Atypical chest pain 07/30/2020  . Fibroids   . Morbid obesity ([place])   . Morbid obesity with body mass index (BMI) of 50.0 to 59.9 in adult [facility]) 07/30/2020  . OSA (obstructive sleep apnea) 07/30/2020  . OSA on CPAP 2021  . Urge incontinence 07/30/2020   Past Surgical History:  Procedure Laterality Date  . ABDOMINAL HYSTERECTOMY     precancerous cervical cells and fibroids.  . conescopy      abnormal pap  . LASIK    . TUBAL LIGATION      Family History  Problem Relation Age of Onset  . Diabetes Mother   . Diabetes Father   . Heart attack Father   . Diabetes Sister    Social History   Socioeconomic History  . Marital status: Divorced    Spouse name: Not on file  . Number of children: Not on file  . Years of education: Not on file  . Highest education level: Not on file  Occupational History  . Not on file  Tobacco Use  . Smoking status: Former Smoker    Quit date: 1980    Years since quitting: 42.3  . Smokeless tobacco: Never Used  Substance and Sexual Activity  . Alcohol use: Yes    Alcohol/week: 1.0 standard drink    Types: 1 Standard drinks or equivalent per week    Comment: per month  . Drug use: Never  . Sexual activity: Not on file  Other Topics Concern  . Not on file  Social History Narrative  . Not on file   Social Determinants of Health   Financial Resource Strain: Not on file  Food Insecurity: Not on file  Transportation Needs: Not on file  Physical Activity: Not on file  Stress: Not on file  Social Connections: Not  on file    Review of Systems  Constitutional: Negative for chills, fatigue and fever.  HENT: Negative for congestion, ear pain and sore throat.   Respiratory: Positive for shortness of breath. Negative for cough.   Cardiovascular: Positive for chest pain.  Gastrointestinal: Negative for abdominal pain, constipation, diarrhea, nausea and vomiting.  Endocrine: Positive for polydipsia. Negative for polyphagia.  Genitourinary: Positive for difficulty urinating, frequency and urgency. Negative for dysuria.  Musculoskeletal: Positive for arthralgias and back pain. Negative for myalgias.  Skin: Positive for rash.  Neurological: Negative for dizziness and headaches.  Psychiatric/Behavioral: Negative for dysphoric mood. The patient is not nervous/anxious.      Objective:  BP 124/84   Pulse 88   Temp (!) 97.5 F (36.4 C)    Resp 16   Ht 5\' 4"  (1.626 m)   Wt 291 lb (132 kg)   BMI 49.95 kg/m   BP/Weight 04/04/2021 03/17/8118 12/13/7827  Systolic BP 562 130 865  Diastolic BP 84 70 78  Wt. (Lbs) 291 298 301.6  BMI 49.95 51.15 51.77    Physical Exam Vitals reviewed.  Constitutional:      Appearance: Normal appearance. She is obese.  Neck:     Vascular: No carotid bruit.  Cardiovascular:     Rate and Rhythm: Normal rate and regular rhythm.     Heart sounds: Normal heart sounds.  Pulmonary:     Effort: Pulmonary effort is normal. No respiratory distress.     Breath sounds: Normal breath sounds.  Abdominal:     General: Abdomen is flat. Bowel sounds are normal.     Palpations: Abdomen is soft.     Tenderness: There is no abdominal tenderness.  Musculoskeletal:     Right lower leg: No edema.     Left lower leg: No edema.  Skin:    Findings: Rash (rt hand) present.  Neurological:     Mental Status: She is alert and oriented to person, place, and time.  Psychiatric:        Mood and Affect: Mood normal.        Behavior: Behavior normal.     Diabetic Foot Exam - Simple   No data filed      Lab Results  Component Value Date   WBC 8.0 04/04/2021   HGB 14.5 04/04/2021   HCT 46.4 04/04/2021   PLT 304 04/04/2021   GLUCOSE 94 04/04/2021   CHOL 132 08/15/2020   TRIG 89 08/15/2020   HDL 36 (L) 08/15/2020   LDLCALC 79 08/15/2020   ALT 19 04/04/2021   AST 20 04/04/2021   NA 140 04/04/2021   K 3.4 (L) 04/04/2021   CL 100 04/04/2021   CREATININE 0.96 04/04/2021   BUN 15 04/04/2021   CO2 25 04/04/2021   TSH 1.590 04/04/2021      Assessment & Plan:  1. OSA (obstructive sleep apnea) Check on cpap supplies.   2. Morbid obesity with body mass index (BMI) of 50.0 to 59.9 in adult [facility]) Recommend continue to work on eating healthy diet and exercise. - CBC with Differential/Platelet - Comprehensive metabolic panel  3. Essential hypertension Well controlled.  No changes to medicines.   Continue to work on eating a healthy diet and exercise.  Labs drawn today.  - CBC with Differential/Platelet - Comprehensive metabolic panel  4. Urge incontinence Start on ditropan. - POCT urinalysis dipstick  5. Hand dermatitis Triamcinalone cream  6. Fatigue, unspecified type - TSH   7. Shingrix vaccination - Shingrix  vaccine given.  Meds ordered this encounter  Medications  . triamcinolone cream (KENALOG) 0.1 %    Sig: Apply 1 application topically 2 (two) times daily for 15 days.    Dispense:  30 g    Refill:  0  . oxybutynin (DITROPAN-XL) 5 MG 24 hr tablet    Sig: Take 1 tablet (5 mg total) by mouth at bedtime.    Dispense:  30 tablet    Refill:  3    Orders Placed This Encounter  Procedures  . Varicella-zoster vaccine IM (Shingrix)  . CBC with Differential/Platelet  . TSH  . Comprehensive metabolic panel  . POCT urinalysis dipstick     Follow-up: Return in about 4 months (around 08/04/2021) for CPE not fasting.  An After Visit Summary was printed and given to the patient.  [name], MD [facility] [phone]

## 2021-04-05 LAB — COMPREHENSIVE METABOLIC PANEL
ALT: 19 IU/L (ref 0–32)
AST: 20 IU/L (ref 0–40)
Albumin/Globulin Ratio: 1.6 (ref 1.2–2.2)
Albumin: 4.5 g/dL (ref 3.8–4.9)
Alkaline Phosphatase: 57 IU/L (ref 44–121)
BUN/Creatinine Ratio: 16 (ref 12–28)
BUN: 15 mg/dL (ref 8–27)
Bilirubin Total: 0.4 mg/dL (ref 0.0–1.2)
CO2: 25 mmol/L (ref 20–29)
Calcium: 9.4 mg/dL (ref 8.7–10.3)
Chloride: 100 mmol/L (ref 96–106)
Creatinine, Ser: 0.96 mg/dL (ref 0.57–1.00)
Globulin, Total: 2.8 g/dL (ref 1.5–4.5)
Glucose: 94 mg/dL (ref 65–99)
Potassium: 3.4 mmol/L — ABNORMAL LOW (ref 3.5–5.2)
Sodium: 140 mmol/L (ref 134–144)
Total Protein: 7.3 g/dL (ref 6.0–8.5)
eGFR: 68 mL/min/{1.73_m2} (ref >59)

## 2021-04-05 LAB — CBC WITH DIFFERENTIAL/PLATELET
Basophils Absolute: 0.1 10*3/uL (ref 0.0–0.2)
Basos: 1 %
EOS (ABSOLUTE): 0.1 10*3/uL (ref 0.0–0.4)
Eos: 2 %
Hematocrit: 46.4 % (ref 34.0–46.6)
Hemoglobin: 14.5 g/dL (ref 11.1–15.9)
Immature Grans (Abs): 0 10*3/uL (ref 0.0–0.1)
Immature Granulocytes: 0 %
Lymphocytes Absolute: 2 10*3/uL (ref 0.7–3.1)
Lymphs: 25 %
MCH: 24.4 pg — ABNORMAL LOW (ref 26.6–33.0)
MCHC: 31.3 g/dL — ABNORMAL LOW (ref 31.5–35.7)
MCV: 78 fL — ABNORMAL LOW (ref 79–97)
Monocytes Absolute: 0.6 10*3/uL (ref 0.1–0.9)
Monocytes: 8 %
Neutrophils Absolute: 5.1 10*3/uL (ref 1.4–7.0)
Neutrophils: 64 %
Platelets: 304 10*3/uL (ref 150–450)
RBC: 5.94 x10E6/uL — ABNORMAL HIGH (ref 3.77–5.28)
RDW: 13.5 % (ref 11.7–15.4)
WBC: 8 10*3/uL (ref 3.4–10.8)

## 2021-04-05 LAB — TSH: TSH: 1.59 u[IU]/mL (ref 0.450–4.500)

## 2021-04-08 ENCOUNTER — Encounter: Payer: Self-pay | Admitting: Family Medicine

## 2021-05-16 ENCOUNTER — Encounter: Payer: Self-pay | Admitting: Family Medicine

## 2021-07-16 ENCOUNTER — Other Ambulatory Visit: Payer: Self-pay | Admitting: Cardiology

## 2021-08-24 DIAGNOSIS — I10 Essential (primary) hypertension: Secondary | ICD-10-CM

## 2021-08-24 DIAGNOSIS — R0602 Shortness of breath: Secondary | ICD-10-CM

## 2021-08-24 DIAGNOSIS — E785 Hyperlipidemia, unspecified: Secondary | ICD-10-CM

## 2021-08-27 ENCOUNTER — Other Ambulatory Visit: Payer: Self-pay

## 2021-08-28 ENCOUNTER — Encounter: Payer: Self-pay | Admitting: Cardiology

## 2021-08-28 ENCOUNTER — Ambulatory Visit (INDEPENDENT_AMBULATORY_CARE_PROVIDER_SITE_OTHER): Payer: Self-pay | Admitting: Cardiology

## 2021-08-28 ENCOUNTER — Other Ambulatory Visit: Payer: Self-pay

## 2021-08-28 VITALS — BP 148/90 | HR 74 | Ht 65.0 in | Wt 295.4 lb

## 2021-08-28 DIAGNOSIS — Z9989 Dependence on other enabling machines and devices: Secondary | ICD-10-CM

## 2021-08-28 DIAGNOSIS — Z6841 Body Mass Index (BMI) 40.0 and over, adult: Secondary | ICD-10-CM

## 2021-08-28 DIAGNOSIS — E785 Hyperlipidemia, unspecified: Secondary | ICD-10-CM

## 2021-08-28 DIAGNOSIS — I10 Essential (primary) hypertension: Secondary | ICD-10-CM

## 2021-08-28 DIAGNOSIS — G4733 Obstructive sleep apnea (adult) (pediatric): Secondary | ICD-10-CM

## 2021-08-28 DIAGNOSIS — R5383 Other fatigue: Secondary | ICD-10-CM

## 2021-08-28 DIAGNOSIS — E8881 Metabolic syndrome: Secondary | ICD-10-CM

## 2021-08-28 MED ORDER — CARVEDILOL 3.125 MG PO TABS
3.1250 mg | ORAL_TABLET | Freq: Two times a day (BID) | ORAL | 3 refills | Status: DC
Start: 2021-08-28 — End: 2022-11-24

## 2021-08-28 NOTE — Progress Notes (Signed)
Cardiology Office Note:    Date:  01/08/8656   ID:  Virginia Barron, DOB [birth year], MRN [id number]  PCP:  [name], MD  Cardiologist:  [name], DO  Electrophysiologist:  None   Referring MD: [name], MD   No chief complaint on file.   History of Present Illness:    Virginia Barron is a 60 y.o. female with a hx of hypertension, obstructive sleep apnea morbid obesity is here today for follow-up visit.  I did see the patient on November 15, 2020 at that time she still was hypotensive we discussed that her blood pressure goal was less than 130/80.  I added amlodipine 5 mg to her regimen.  I saw the patient on December 13, 2020 at that time her blood pressure was acceptable we will continue hydrochlorothiazide and amlodipine.  She is here today for follow-up visit.  She tells me she has been experiencing significant fatigue no other complaints at this time.   Past Medical History:  Diagnosis Date   Atypical chest pain 07/30/2020   Essential hypertension 08/15/2020   Fibroids    Morbid obesity ([place])    Morbid obesity with body mass index (BMI) of 50.0 to 59.9 in adult ([place]) 07/30/2020   OSA (obstructive sleep apnea) 07/30/2020   OSA on CPAP 2021   Precordial chest pain 08/15/2020   Precordial pain 09/14/2020   Shortness of breath 08/15/2020   Urge incontinence 07/30/2020    Past Surgical History:  Procedure Laterality Date   ABDOMINAL HYSTERECTOMY     precancerous cervical cells and fibroids.   conescopy     abnormal pap   LASIK     TUBAL LIGATION      Current Medications: Current Meds  Medication Sig   amLODipine (NORVASC) 5 MG tablet Take 1 tablet (5 mg total) by mouth daily.   aspirin-acetaminophen-caffeine (EXCEDRIN MIGRAINE) 250-250-65 MG tablet Take 1 tablet by mouth every 6 (six) hours as needed for headache.   carvedilol (COREG) 3.125 MG tablet Take 1 tablet (3.125 mg total) by mouth 2 (two) times daily with a meal.   cholecalciferol (VITAMIN D3) 25 MCG (1000  UNIT) tablet Take 400 Units by mouth daily.    diphenhydrAMINE (BENADRYL) 25 MG tablet Take 25 mg by mouth as needed for allergies.   hydrochlorothiazide (HYDRODIURIL) 25 MG tablet Take 1 tablet (25 mg total) by mouth daily.   Multiple Vitamin (MULTIVITAMIN) tablet Take 1 tablet by mouth daily.   nitroGLYCERIN (NITROSTAT) 0.4 MG SL tablet Place 0.4 mg under the tongue every 5 (five) minutes as needed for chest pain.   oxybutynin (DITROPAN-XL) 5 MG 24 hr tablet Take 1 tablet (5 mg total) by mouth at bedtime.   Turmeric (QC TUMERIC COMPLEX PO) Take 1 capsule by mouth daily.     Allergies:   Avocado, Iodine, Kiwi extract, Penicillins, Pork-derived products, Shellfish allergy, and Latex   Social History   Socioeconomic History   Marital status: Divorced    Spouse name: Not on file   Number of children: Not on file   Years of education: Not on file   Highest education level: Not on file  Occupational History   Not on file  Tobacco Use   Smoking status: Former    Types: Cigarettes    Quit date: 1980    Years since quitting: 42.7   Smokeless tobacco: Never  Substance and Sexual Activity   Alcohol use: Yes    Alcohol/week: 1.0 standard drink    Types: 1 Standard  drinks or equivalent per week    Comment: per month   Drug use: Never   Sexual activity: Not on file  Other Topics Concern   Not on file  Social History Narrative   Not on file   Social Determinants of Health   Financial Resource Strain: Not on file  Food Insecurity: Not on file  Transportation Needs: Not on file  Physical Activity: Not on file  Stress: Not on file  Social Connections: Not on file     Family History: The patient's family history includes Diabetes in her father, mother, and sister; Heart attack in her father.  ROS:   Review of Systems  Constitution: Negative for decreased appetite, fever and weight gain.  HENT: Negative for congestion, ear discharge, hoarse voice and sore throat.   Eyes:  Negative for discharge, redness, vision loss in right eye and visual halos.  Cardiovascular: Negative for chest pain, dyspnea on exertion, leg swelling, orthopnea and palpitations.  Respiratory: Negative for cough, hemoptysis, shortness of breath and snoring.   Endocrine: Negative for heat intolerance and polyphagia.  Hematologic/Lymphatic: Negative for bleeding problem. Does not bruise/bleed easily.  Skin: Negative for flushing, nail changes, rash and suspicious lesions.  Musculoskeletal: Negative for arthritis, joint pain, muscle cramps, myalgias, neck pain and stiffness.  Gastrointestinal: Negative for abdominal pain, bowel incontinence, diarrhea and excessive appetite.  Genitourinary: Negative for decreased libido, genital sores and incomplete emptying.  Neurological: Negative for brief paralysis, focal weakness, headaches and loss of balance.  Psychiatric/Behavioral: Negative for altered mental status, depression and suicidal ideas.  Allergic/Immunologic: Negative for HIV exposure and persistent infections.    EKGs/Labs/Other Studies Reviewed:    The following studies were reviewed today:   EKG:  The ekg ordered today demonstrates sinus rhythm, heart rate 84 bpm.  Compared to prior EKG no significant change.   Transthoracic echocardiogram 2021 IMPRESSIONS   1. Left ventricular ejection fraction, by estimation, is 60 to 65%. The  left ventricle has normal function. The left ventricle has no regional  wall motion abnormalities. There is severe concentric left ventricular  hypertrophy. Left ventricular diastolic   parameters are consistent with Grade I diastolic dysfunction (impaired  relaxation).   2. Right ventricular systolic function is normal. The right ventricular  size is normal. There is normal pulmonary artery systolic pressure.   3. The mitral valve is normal in structure. No evidence of mitral valve  regurgitation. No evidence of mitral stenosis.   4. The aortic valve  is normal in structure. Aortic valve regurgitation is  not visualized. No aortic stenosis is present.   5. The inferior vena cava is normal in size with greater than 50%  respiratory variability, suggesting right atrial pressure of 3 mmHg.   Comparison(s): No prior Echocardiogram.   FINDINGS   Left Ventricle: Left ventricular ejection fraction, by estimation, is 60  to 65%. The left ventricle has normal function. The left ventricle has no  regional wall motion abnormalities. The left ventricular internal cavity  size was normal in size. There is   severe concentric left ventricular hypertrophy. Left ventricular  diastolic parameters are consistent with Grade I diastolic dysfunction  (impaired relaxation).   Right Ventricle: The right ventricular size is normal. No increase in  right ventricular wall thickness. Right ventricular systolic function is  normal. There is normal pulmonary artery systolic pressure. The tricuspid  regurgitant velocity is 1.49 m/s, and   with an assumed right atrial pressure of 3 mmHg, the estimated right  ventricular  systolic pressure is 22.0 mmHg.   Left Atrium: Left atrial size was normal in size.   Right Atrium: Right atrial size was normal in size.   Pericardium: There is no evidence of pericardial effusion. Presence of  pericardial fat pad.   Mitral Valve: The mitral valve is normal in structure. No evidence of  mitral valve regurgitation. No evidence of mitral valve stenosis.   Tricuspid Valve: The tricuspid valve is normal in structure. Tricuspid  valve regurgitation is trivial. No evidence of tricuspid stenosis.   Aortic Valve: The aortic valve is normal in structure. Aortic valve  regurgitation is not visualized. No aortic stenosis is present.   Pulmonic Valve: The pulmonic valve was not well visualized. Pulmonic valve  regurgitation is not visualized. No evidence of pulmonic stenosis.   Aorta: The aortic root is normal in size and  structure.   Venous: The inferior vena cava is normal in size with greater than 50%  respiratory variability, suggesting right atrial pressure of 3 mmHg.   IAS/Shunts: No atrial level shunt detected by color flow Doppler.    Coronary CT scan October 2021 Aorta: Normal size.  No calcifications.  No dissection.   Aortic Valve:  Trileaflet.  No calcifications.   Coronary Arteries:  Normal coronary origin.  Right dominance.   RCA is a large dominant artery that gives rise to PDA and PLVB. There is no plaque.   Left main is a large artery that gives rise to LAD and LCX arteries.   LAD is a large vessel that has no plaque. There is small area of mild myocardial bridging in the mid LAD.   LCX is a non-dominant artery that gives rise to one large OM1 branch. There is no plaque.   Other findings:   Normal pulmonary vein drainage into the left atrium.   Normal left atrial appendage without a thrombus.   Normal size of the pulmonary artery.   IMPRESSION: 1. Coronary calcium score of 0. This was 0 percentile for age and sex matched control.   2. Normal coronary origin with right dominance.   3. No evidence of CAD.   4. Mild Mid LAD myocardial bridging.   [name], DO     Electronically Signed   By: [name] DO   On: 10/11/2020 12:27    Addended by [name], DO on 10/11/2020 12:29 PM   Study Result  Narrative & Impression  EXAM: OVER-READ INTERPRETATION  CT CHEST   The following report is an over-read performed by radiologist Dr. Vinnie Langton of [facility], [place] on 10/05/2020. This over-read does not include interpretation of cardiac or coronary anatomy or pathology. The coronary calcium score/coronary CTA interpretation by the cardiologist is attached.   COMPARISON:  None.   FINDINGS: Within the visualized portions of the thorax there are no suspicious appearing pulmonary nodules or masses, there is no acute consolidative airspace  disease, no pleural effusions, no pneumothorax and no lymphadenopathy. Visualized portions of the upper abdomen are unremarkable. There are no aggressive appearing lytic or blastic lesions noted in the visualized portions of the skeleton.   IMPRESSION: No significant incidental noncardiac findings are noted.   Electronically Signed: By: Vinnie Langton M.D. On: 10/05/2020 14:31     Result History        Recent Labs: 09/14/2020: Magnesium 2.2 04/04/2021: ALT 19; BUN 15; Creatinine, Ser 0.96; Hemoglobin 14.5; Platelets 304; Potassium 3.4; Sodium 140; TSH 1.590  Recent Lipid Panel    Component Value Date/Time  CHOL 132 08/15/2020 1507   TRIG 89 08/15/2020 1507   HDL 36 (L) 08/15/2020 1507   CHOLHDL 3.7 08/15/2020 1507   LDLCALC 79 08/15/2020 1507    Physical Exam:    VS:  BP (!) 148/90   Pulse 74   Ht 5\' 5"  (1.651 m)   Wt 295 lb 6.4 oz (134 kg)   SpO2 97%   BMI 49.16 kg/m     Wt Readings from Last 3 Encounters:  08/28/21 295 lb 6.4 oz (134 kg)  04/04/21 291 lb (132 kg)  12/26/20 298 lb (135.2 kg)     GEN: Well nourished, well developed in no acute distress HEENT: Normal NECK: No JVD; No carotid bruits LYMPHATICS: No lymphadenopathy CARDIAC: S1S2 noted,RRR, no murmurs, rubs, gallops RESPIRATORY:  Clear to auscultation without rales, wheezing or rhonchi  ABDOMEN: Soft, non-tender, non-distended, +bowel sounds, no guarding. EXTREMITIES: No edema, No cyanosis, no clubbing MUSCULOSKELETAL:  No deformity  SKIN: Warm and dry NEUROLOGIC:  Alert and oriented x 3, non-focal PSYCHIATRIC:  Normal affect, good insight  ASSESSMENT:    1. Fatigue, unspecified type   2. Essential hypertension   3. Hyperlipidemia, unspecified hyperlipidemia type   4. Metabolic syndrome   5. OSA on CPAP   6. Morbid obesity with body mass index (BMI) of 50.0 to 59.9 in adult [facility])    PLAN:     She is hypertensive in the office today.  She is currently on amlodipine 5 mg daily with  her hydrochlorothiazide.  However to add low-dose beta-blocker hopefully we can get her a target of less than 130/80 mmHg.  She seems fatigued she recently had COVID she tells me.  I am also going to get a vitamin D level to make sure this is not playing a role.  The patient will continue to use her CPAP for now.  The patient understands the need to lose weight with diet and exercise. We have discussed specific strategies for this.  The patient is in agreement with the above plan. The patient left the office in stable condition.  The patient will follow up in 3 months due to medication change.   Medication Adjustments/Labs and Tests Ordered: Current medicines are reviewed at length with the patient today.  Concerns regarding medicines are outlined above.  Orders Placed This Encounter  Procedures   Basic metabolic panel   Lipid panel   Magnesium   Hemoglobin A1c   VITAMIN D 25 Hydroxy (Vit-D Deficiency, Fractures)   EKG 12-Lead   Meds ordered this encounter  Medications   carvedilol (COREG) 3.125 MG tablet    Sig: Take 1 tablet (3.125 mg total) by mouth 2 (two) times daily with a meal.    Dispense:  180 tablet    Refill:  3    Patient Instructions  Medication Instructions:  Your physician has recommended you make the following change in your medication:   Start Coreg 3.125 mg twice daily.  *If you need a refill on your cardiac medications before your next appointment, please call your pharmacy*   Lab Work: Your physician recommends that you have labs done in the office today. Your test included  basic metabolic panel, magnesium, vitamin D, A1C and lipids.  If you have labs (blood work) drawn today and your tests are completely normal, you will receive your results only by: [place] (if you have MyChart) OR A paper copy in the mail If you have any lab test that is abnormal or we need to  change your treatment, we will call you to review the  results.   Testing/Procedures: None ordered   Follow-Up: At [facility], you and your health needs are our priority.  As part of our continuing mission to provide you with exceptional heart care, we have created designated Provider Care Teams.  These Care Teams include your primary Cardiologist (physician) and Advanced Practice Providers (APPs -  Physician Assistants and Nurse Practitioners) who all work together to provide you with the care you need, when you need it.  We recommend signing up for the patient portal called "MyChart".  Sign up information is provided on this After Visit Summary.  MyChart is used to connect with patients for Virtual Visits (Telemedicine).  Patients are able to view lab/test results, encounter notes, upcoming appointments, etc.  Non-urgent messages can be sent to your provider as well.   To learn more about what you can do with MyChart, go to [email or web address].    Your next appointment:   3 month(s)  The format for your next appointment:   In Person  Provider:   [name], MD at [facility]   Other Instructions NA     Adopting a Healthy Lifestyle.  Know what a healthy weight is for you (roughly BMI <25) and aim to maintain this   Aim for 7+ servings of fruits and vegetables daily   65-80+ fluid ounces of water or unsweet tea for healthy kidneys   Limit to max 1 drink of alcohol per day; avoid smoking/tobacco   Limit animal fats in diet for cholesterol and heart health - choose grass fed whenever available   Avoid highly processed foods, and foods high in saturated/trans fats   Aim for low stress - take time to unwind and care for your mental health   Aim for 150 min of moderate intensity exercise weekly for heart health, and weights twice weekly for bone health   Aim for 7-9 hours of sleep daily   When it comes to diets, agreement about the perfect plan isnt easy to find, even among the experts. Experts at the [employer] developed an idea known as the Healthy Eating Plate. Just imagine a plate divided into logical, healthy portions.   The emphasis is on diet quality:   Load up on vegetables and fruits - one-half of your plate: Aim for color and variety, and remember that potatoes dont count.   Go for whole grains - one-quarter of your plate: Whole wheat, barley, wheat berries, quinoa, oats, brown rice, and foods made with them. If you want pasta, go with whole wheat pasta.   Protein power - one-quarter of your plate: Fish, chicken, beans, and nuts are all healthy, versatile protein sources. Limit red meat.   The diet, however, does go beyond the plate, offering a few other suggestions.   Use healthy plant oils, such as olive, canola, soy, corn, sunflower and peanut. Check the labels, and avoid partially hydrogenated oil, which have unhealthy trans fats.   If youre thirsty, drink water. Coffee and tea are good in moderation, but skip sugary drinks and limit milk and dairy products to one or two daily servings.   The type of carbohydrate in the diet is more important than the amount. Some sources of carbohydrates, such as vegetables, fruits, whole grains, and beans-are healthier than others.   Finally, stay active  Signed, [name], DO  08/28/2021 9:02 AM    [place]

## 2021-08-28 NOTE — Patient Instructions (Signed)
Medication Instructions:  Your physician has recommended you make the following change in your medication:   Start Coreg 3.125 mg twice daily.  *If you need a refill on your cardiac medications before your next appointment, please call your pharmacy*   Lab Work: Your physician recommends that you have labs done in the office today. Your test included  basic metabolic panel, magnesium, vitamin D, A1C and lipids.  If you have labs (blood work) drawn today and your tests are completely normal, you will receive your results only by: [place] (if you have MyChart) OR A paper copy in the mail If you have any lab test that is abnormal or we need to change your treatment, we will call you to review the results.   Testing/Procedures: None ordered   Follow-Up: At [facility], you and your health needs are our priority.  As part of our continuing mission to provide you with exceptional heart care, we have created designated Provider Care Teams.  These Care Teams include your primary Cardiologist (physician) and Advanced Practice Providers (APPs -  Physician Assistants and Nurse Practitioners) who all work together to provide you with the care you need, when you need it.  We recommend signing up for the patient portal called "MyChart".  Sign up information is provided on this After Visit Summary.  MyChart is used to connect with patients for Virtual Visits (Telemedicine).  Patients are able to view lab/test results, encounter notes, upcoming appointments, etc.  Non-urgent messages can be sent to your provider as well.   To learn more about what you can do with MyChart, go to [email or web address].    Your next appointment:   3 month(s)  The format for your next appointment:   In Person  Provider:   [name], MD at [facility]   Other Instructions NA

## 2021-08-29 LAB — VITAMIN D 25 HYDROXY (VIT D DEFICIENCY, FRACTURES): Vit D, 25-Hydroxy: 33.6 ng/mL (ref 30.0–100.0)

## 2021-08-29 LAB — LIPID PANEL
Chol/HDL Ratio: 4.1 ratio (ref 0.0–4.4)
Cholesterol, Total: 153 mg/dL (ref 100–199)
HDL: 37 mg/dL — ABNORMAL LOW (ref >39)
LDL Chol Calc (NIH): 99 mg/dL (ref 0–99)
Triglycerides: 88 mg/dL (ref 0–149)
VLDL Cholesterol Cal: 17 mg/dL (ref 5–40)

## 2021-08-29 LAB — BASIC METABOLIC PANEL
BUN/Creatinine Ratio: 16 (ref 12–28)
BUN: 15 mg/dL (ref 8–27)
CO2: 23 mmol/L (ref 20–29)
Calcium: 9.4 mg/dL (ref 8.7–10.3)
Chloride: 100 mmol/L (ref 96–106)
Creatinine, Ser: 0.92 mg/dL (ref 0.57–1.00)
Glucose: 103 mg/dL — ABNORMAL HIGH (ref 65–99)
Potassium: 3.5 mmol/L (ref 3.5–5.2)
Sodium: 140 mmol/L (ref 134–144)
eGFR: 71 mL/min/{1.73_m2} (ref >59)

## 2021-08-29 LAB — HEMOGLOBIN A1C
Est. average glucose Bld gHb Est-mCnc: 140 mg/dL
Hgb A1c MFr Bld: 6.5 % — ABNORMAL HIGH (ref 4.8–5.6)

## 2021-08-29 LAB — MAGNESIUM: Magnesium: 2.3 mg/dL (ref 1.6–2.3)

## 2021-09-03 ENCOUNTER — Ambulatory Visit (INDEPENDENT_AMBULATORY_CARE_PROVIDER_SITE_OTHER): Payer: Self-pay | Admitting: Family Medicine

## 2021-09-03 ENCOUNTER — Other Ambulatory Visit: Payer: Self-pay

## 2021-09-03 ENCOUNTER — Encounter: Payer: Self-pay | Admitting: Family Medicine

## 2021-09-03 VITALS — BP 120/74 | HR 68 | Temp 97.3°F | Resp 16 | Ht 65.0 in | Wt 294.0 lb

## 2021-09-03 DIAGNOSIS — E1169 Type 2 diabetes mellitus with other specified complication: Secondary | ICD-10-CM

## 2021-09-03 DIAGNOSIS — Z23 Encounter for immunization: Secondary | ICD-10-CM

## 2021-09-03 DIAGNOSIS — E785 Hyperlipidemia, unspecified: Secondary | ICD-10-CM

## 2021-09-03 DIAGNOSIS — Z6841 Body Mass Index (BMI) 40.0 and over, adult: Secondary | ICD-10-CM

## 2021-09-03 DIAGNOSIS — I10 Essential (primary) hypertension: Secondary | ICD-10-CM

## 2021-09-03 DIAGNOSIS — N3941 Urge incontinence: Secondary | ICD-10-CM

## 2021-09-03 DIAGNOSIS — G4733 Obstructive sleep apnea (adult) (pediatric): Secondary | ICD-10-CM

## 2021-09-03 LAB — POCT UA - MICROALBUMIN: Microalbumin Ur, POC: 10 mg/L

## 2021-09-03 MED ORDER — ROSUVASTATIN CALCIUM 10 MG PO TABS: 10.0000 mg | ORAL_TABLET | Freq: Every day | ORAL | 1 refills | Status: DC

## 2021-09-03 MED ORDER — METFORMIN HCL 500 MG PO TABS: 500.0000 mg | ORAL_TABLET | Freq: Every day | ORAL | 1 refills | Status: DC

## 2021-09-03 NOTE — Progress Notes (Signed)
Subjective:  Patient ID: Myrle Glasheen, female    DOB: 1961-04-06  Age: 60 y.o. MRN: [id number]  Chief Complaint  Patient presents with   Diabetes    HPI 60 year old African-American female who presents for chronic follow-up.  Patient has not been seen in several months.  She saw Dr. Harriet Masson who checked her hemoglobin A1c which was 6.5.  This is a new diagnosis of diabetes.  She has been prediabetic in the past.  Mrs. Rotunno does not eat healthy nor does she exercise.  Currently on no medications for diabetes. Hypertension: Currently on carvedilol 3.125 twice daily (which was just added by Dr. Harriet Masson), HCTZ 25 mg daily.  And amlodipine 5 mg once daily. Hyperlipidemia: Currently on Crestor 10 mg daily.  Patient is not eating healthy nor is she exercising. Urge incontinence: Currently on oxybutynin 5 mg once daily at bedtime. Has been diagnosed with sleep apnea.  In December I worked on getting her CPAP.  She said she is never received a phone call from the durable medical equipment company despite the fact that I sent the order. Patient is requesting a flu shot today as well as her second Shingrix vaccine.  Current Outpatient Medications on File Prior to Visit  Medication Sig Dispense Refill   amLODipine (NORVASC) 5 MG tablet Take 1 tablet (5 mg total) by mouth daily. 30 tablet 11   aspirin-acetaminophen-caffeine (EXCEDRIN MIGRAINE) 250-250-65 MG tablet Take 1 tablet by mouth every 6 (six) hours as needed for headache.     carvedilol (COREG) 3.125 MG tablet Take 1 tablet (3.125 mg total) by mouth 2 (two) times daily with a meal. 180 tablet 3   cholecalciferol (VITAMIN D3) 25 MCG (1000 UNIT) tablet Take 400 Units by mouth daily.      diphenhydrAMINE (BENADRYL) 25 MG tablet Take 25 mg by mouth as needed for allergies.     hydrochlorothiazide (HYDRODIURIL) 25 MG tablet Take 1 tablet (25 mg total) by mouth daily. 30 tablet 11   Multiple Vitamin (MULTIVITAMIN) tablet Take 1 tablet by mouth daily.      nitroGLYCERIN (NITROSTAT) 0.4 MG SL tablet Place 0.4 mg under the tongue every 5 (five) minutes as needed for chest pain.     oxybutynin (DITROPAN-XL) 5 MG 24 hr tablet Take 1 tablet (5 mg total) by mouth at bedtime. 30 tablet 3   Turmeric (QC TUMERIC COMPLEX PO) Take 1 capsule by mouth daily.     No current facility-administered medications on file prior to visit.   Past Medical History:  Diagnosis Date   Atypical chest pain 07/30/2020   Essential hypertension 08/15/2020   Fibroids    Morbid obesity ([place])    Morbid obesity with body mass index (BMI) of 50.0 to 59.9 in adult ([place]) 07/30/2020   OSA (obstructive sleep apnea) 07/30/2020   OSA on CPAP 2021   Precordial chest pain 08/15/2020   Precordial pain 09/14/2020   Shortness of breath 08/15/2020   Urge incontinence 07/30/2020   Past Surgical History:  Procedure Laterality Date   ABDOMINAL HYSTERECTOMY     precancerous cervical cells and fibroids.   conescopy     abnormal pap   LASIK     TUBAL LIGATION      Family History  Problem Relation Age of Onset   Diabetes Mother    Diabetes Father    Heart attack Father    Diabetes Sister    Social History   Socioeconomic History   Marital status: Divorced    Spouse  name: Not on file   Number of children: Not on file   Years of education: Not on file   Highest education level: Not on file  Occupational History   Not on file  Tobacco Use   Smoking status: Former    Types: Cigarettes    Quit date: 1980    Years since quitting: 42.7   Smokeless tobacco: Never  Substance and Sexual Activity   Alcohol use: Yes    Alcohol/week: 1.0 standard drink    Types: 1 Standard drinks or equivalent per week    Comment: per month   Drug use: Never   Sexual activity: Not on file  Other Topics Concern   Not on file  Social History Narrative   Not on file   Social Determinants of Health   Financial Resource Strain: Not on file  Food Insecurity: Not on file  Transportation Needs: Not on  file  Physical Activity: Not on file  Stress: Not on file  Social Connections: Not on file    Review of Systems  Constitutional:  Negative for chills, fatigue and fever.  HENT:  Negative for congestion, rhinorrhea and sore throat.   Respiratory:  Positive for cough. Negative for shortness of breath.   Cardiovascular:  Negative for chest pain.  Gastrointestinal:  Negative for abdominal pain, constipation, diarrhea, nausea and vomiting.  Endocrine: Positive for polydipsia.  Genitourinary:  Negative for dysuria and urgency.  Musculoskeletal:  Positive for arthralgias (shoulder and knee pain). Negative for back pain and myalgias.  Skin:  Positive for wound (left great toe).  Neurological:  Negative for dizziness, weakness, light-headedness and headaches.  Psychiatric/Behavioral:  Negative for dysphoric mood. The patient is not nervous/anxious.     Objective:  BP 120/74   Pulse 68   Temp (!) 97.3 F (36.3 C)   Resp 16   Ht 5\' 5"  (1.651 m)   Wt 294 lb (133.4 kg)   BMI 48.92 kg/m   BP/Weight 09/03/2021 08/28/2021 03/25/4080  Systolic BP 448 185 631  Diastolic BP 74 90 84  Wt. (Lbs) 294 295.4 291  BMI 48.92 49.16 49.95    Physical Exam Vitals reviewed.  Constitutional:      Appearance: Normal appearance. She is obese.  Neck:     Vascular: No carotid bruit.  Cardiovascular:     Rate and Rhythm: Normal rate and regular rhythm.     Pulses: Normal pulses.     Heart sounds: Normal heart sounds.  Pulmonary:     Effort: Pulmonary effort is normal. No respiratory distress.     Breath sounds: Normal breath sounds.  Abdominal:     General: Abdomen is flat. Bowel sounds are normal.     Palpations: Abdomen is soft.     Tenderness: There is no abdominal tenderness.  Neurological:     Mental Status: She is alert and oriented to person, place, and time.  Psychiatric:        Mood and Affect: Mood normal.        Behavior: Behavior normal.    Diabetic Foot Exam - Simple   Simple  Foot Form  09/03/2021  9:39 AM  Visual Inspection See comments: Yes Sensation Testing Intact to touch and monofilament testing bilaterally: Yes Pulse Check Posterior Tibialis and Dorsalis pulse intact bilaterally: Yes Comments Scab on left toe. Healing well.       Lab Results  Component Value Date   WBC 8.0 04/04/2021   HGB 14.5 04/04/2021   HCT 46.4 04/04/2021  PLT 304 04/04/2021   GLUCOSE 103 (H) 08/28/2021   CHOL 153 08/28/2021   TRIG 88 08/28/2021   HDL 37 (L) 08/28/2021   LDLCALC 99 08/28/2021   ALT 19 04/04/2021   AST 20 04/04/2021   NA 140 08/28/2021   K 3.5 08/28/2021   CL 100 08/28/2021   CREATININE 0.92 08/28/2021   BUN 15 08/28/2021   CO2 23 08/28/2021   TSH 1.590 04/04/2021   HGBA1C 6.5 (H) 08/28/2021      Assessment & Plan:   Problem List Items Addressed This Visit       Cardiovascular and Mediastinum   Essential hypertension    Well-controlled. Continue amlodipine 5 mg once daily, HCTZ 25 mg once daily, and carvedilol 3.125 mg twice daily. Work on eating healthy and exercising even if it is just walking.       Relevant Medications   rosuvastatin (CRESTOR) 10 MG tablet     Respiratory   OSA (obstructive sleep apnea) - Primary    Patient needs CPAP.  Unsure if this is contributing to her low energy as well as her hypertension. I will follow-up and see if the DME company reached out to her.      Relevant Orders   Ambulatory Referral for DME     Endocrine   Dyslipidemia associated with type 2 diabetes mellitus ([place])    Start on metformin 500 mg once daily. Diabetic education given. I do not believe the patient is check sugars daily but does need to come back every 3 months at this point. I would like to put the patient on a GLP-1 or SGL 2 for the cardiovascular benefits as well as the weight loss however patient is self-pay and is not a practical cost for her.  I did suggest she goes online and looks for patient assistance for Ozempic.       Relevant Medications   rosuvastatin (CRESTOR) 10 MG tablet   metFORMIN (GLUCOPHAGE) 500 MG tablet   Other Relevant Orders   POCT UA - Microalbumin     Other   Urge incontinence    Well-controlled.  Continue oxybutynin 5 mg once daily.      Morbid obesity with body mass index (BMI) of 45.0 to 49.9 in adult [facility])    Recommend continue to work on eating healthy diabetic diet and exercise.       Relevant Medications   metFORMIN (GLUCOPHAGE) 500 MG tablet   Other Visit Diagnoses     Need for vaccination for zoster       Relevant Orders   Varicella-zoster vaccine IM (Shingrix) (Completed)   Need for influenza vaccination       Relevant Orders   Flu Vaccine MDCK QUAD PF (Completed)     .  Meds ordered this encounter  Medications   rosuvastatin (CRESTOR) 10 MG tablet    Sig: Take 1 tablet (10 mg total) by mouth daily.    Dispense:  90 tablet    Refill:  1   metFORMIN (GLUCOPHAGE) 500 MG tablet    Sig: Take 1 tablet (500 mg total) by mouth daily with breakfast.    Dispense:  90 tablet    Refill:  1    Orders Placed This Encounter  Procedures   Varicella-zoster vaccine IM (Shingrix)   Flu Vaccine MDCK QUAD PF   Ambulatory Referral for DME   POCT UA - Microalbumin     Follow-up: Return in about 15 weeks (around 12/17/2021) for chronic fasting.  An  After Visit Summary was printed and given to the patient.  [name], MD [facility] [phone]

## 2021-09-03 NOTE — Patient Instructions (Addendum)
Start on metformin 500 mg once daily.  Start on pravastatin 10 mg daily. Look on line at ozempic to see if you might qualify for pt assistance. If you do, fill out forms and drop off to [place] and we will fill out our part.   Diabetic recommendations: Visits with PCP every 3-6 months depending on Hemoglobin A1C control (goal is < 7.0.) Dietary avoidance/limitation of sugar and carbohydrates, fat, and salt. Exercise (aerobic) 3-5 days per week.  Kidney Care:  Urine microalbumin (protein) should be checked at least annually. To treat or prevent nephropathy (leaking of protein in urine) - all patients should be on an ACE or an ARB Medicines. Heart Disease Prevention: All diabetics should be on a statin cholesterol medicine regardless of cholesterol levels. If cholesterol levels are high, recommend treat to goal of LDL cholesterol level of less than 70. Diabetic Eye Care: See an eye doctor at least annually. More if recommended by the eye doctor.  Foot Care: Check feet visually daily. Blood pressure (hypertension) control < 130/85 Vaccinations: annual flu shot, pneumovax 23, tetanus (tdap), covid 19, and shingrix vaccines.

## 2021-09-06 ENCOUNTER — Ambulatory Visit: Payer: 59 | Admitting: Cardiology

## 2021-09-09 DIAGNOSIS — E785 Hyperlipidemia, unspecified: Secondary | ICD-10-CM | POA: Insufficient documentation

## 2021-09-09 DIAGNOSIS — E1169 Type 2 diabetes mellitus with other specified complication: Secondary | ICD-10-CM | POA: Insufficient documentation

## 2021-09-09 HISTORY — DX: Type 2 diabetes mellitus with other specified complication: E11.69

## 2021-09-09 NOTE — Assessment & Plan Note (Signed)
Well-controlled.  Continue oxybutynin 5 mg once daily.

## 2021-09-09 NOTE — Assessment & Plan Note (Addendum)
Start on metformin 500 mg once daily. Start on pravastatin 10 m gonce daily.  Diabetic education given. I do not believe the patient is check sugars daily but does need to come back every 3 months at this point. I would like to put the patient on a GLP-1 or SGL 2 for the cardiovascular benefits as well as the weight loss however patient is self-pay and is not a practical cost for her.  I did suggest she goes online and looks for patient assistance for Ozempic.

## 2021-09-09 NOTE — Assessment & Plan Note (Signed)
Well-controlled. Continue amlodipine 5 mg once daily, HCTZ 25 mg once daily, and carvedilol 3.125 mg twice daily. Work on eating healthy and exercising even if it is just walking.

## 2021-09-09 NOTE — Assessment & Plan Note (Signed)
Patient needs CPAP.  Unsure if this is contributing to her low energy as well as her hypertension. I will follow-up and see if the DME company reached out to her.

## 2021-09-09 NOTE — Assessment & Plan Note (Addendum)
Recommend continue to work on eating healthy diabetic diet and exercise.

## 2021-09-26 ENCOUNTER — Encounter: Payer: Self-pay | Admitting: Family Medicine

## 2021-10-25 ENCOUNTER — Encounter: Payer: 59 | Admitting: Family Medicine

## 2021-12-05 ENCOUNTER — Ambulatory Visit: Payer: Self-pay | Admitting: Cardiology

## 2021-12-17 ENCOUNTER — Ambulatory Visit: Payer: Self-pay | Admitting: Family Medicine

## 2021-12-25 ENCOUNTER — Ambulatory Visit: Payer: Self-pay | Admitting: Cardiology

## 2022-01-08 ENCOUNTER — Other Ambulatory Visit: Payer: Self-pay | Admitting: Cardiology

## 2022-02-02 IMAGING — CT CT HEART MORP W/ CTA COR W/ SCORE W/ CA W/CM &/OR W/O CM
4 of 7 series · 8 of 20 positions shown, 9 images · IV contrast (APPLIED)
Comparison: None.
COMPARISON: None.

Addendum:
EXAM:
OVER-READ INTERPRETATION  CT CHEST

The following report is an over-read performed by radiologist Dr.
Candace Montellano [REDACTED] on 10/05/2020. This
over-read does not include interpretation of cardiac or coronary
anatomy or pathology. The coronary calcium score/coronary CTA
interpretation by the cardiologist is attached.
CLINICAL DATA: This is a 59 year old female with chest pain.
Cardiac/Coronary  CT
TECHNIQUE: The patient was scanned on a [employer] Force scanner.

[Series 7: best diast 76 % · axial · 0.39mm/px · z∈[-205,-162]mm · 2 of 318 slices shown, 3 images]
[im 106/318  vessel]
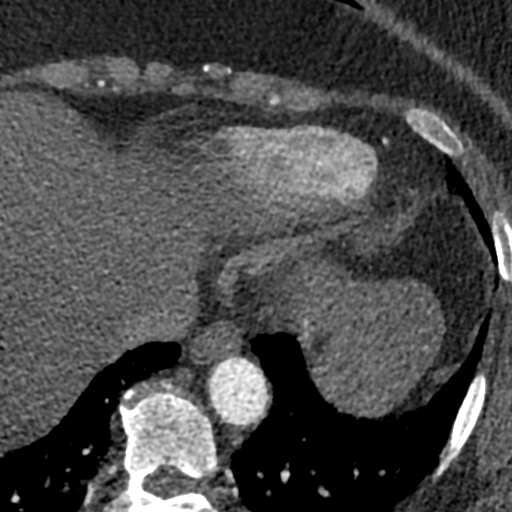
[im 106/318  lung]
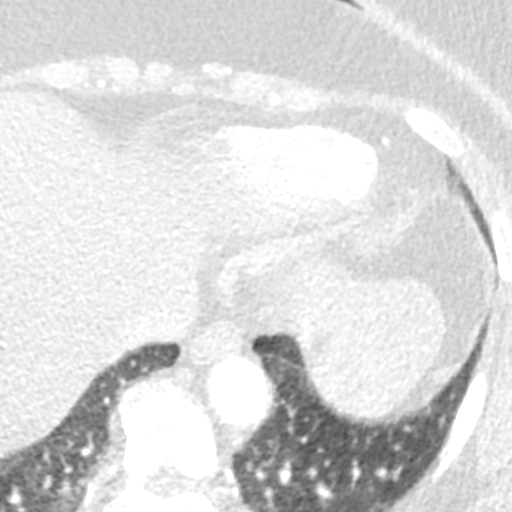
[im 212/318  vessel]
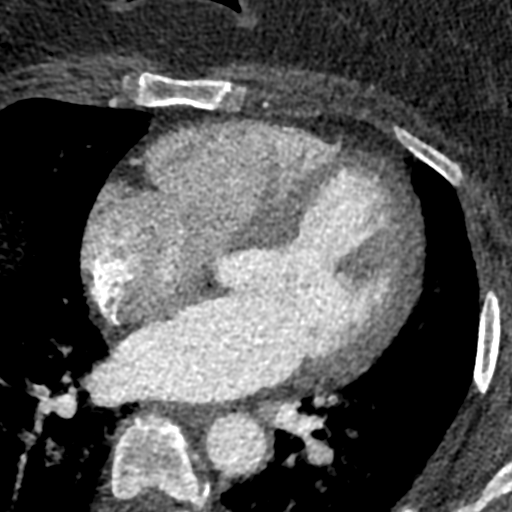

[Series 8: best syst 37 % · axial · 0.39mm/px · z∈[-205,-162]mm · 2 of 318 slices shown]
[im 106/318  vessel]
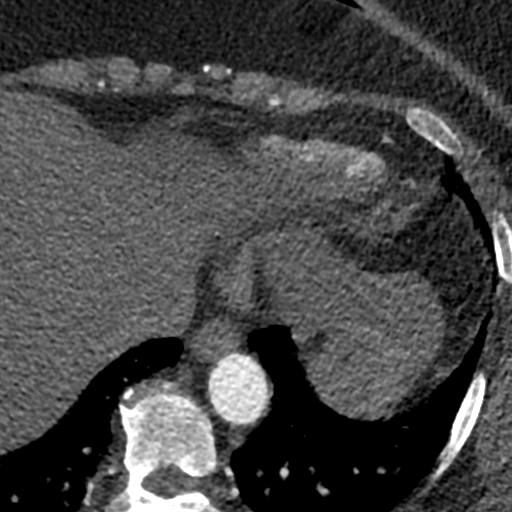
[im 212/318  vessel]
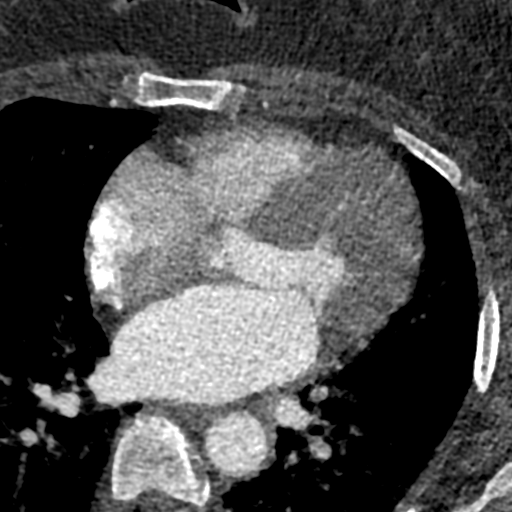

[Series 9: ts diast sharp 76 % · axial · 0.39mm/px · z∈[-205,-162]mm · 2 of 318 slices shown]
[im 106/318  lung]
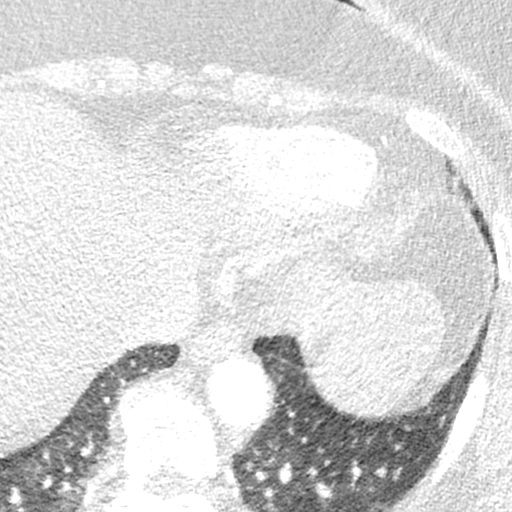
[im 212/318  lung]
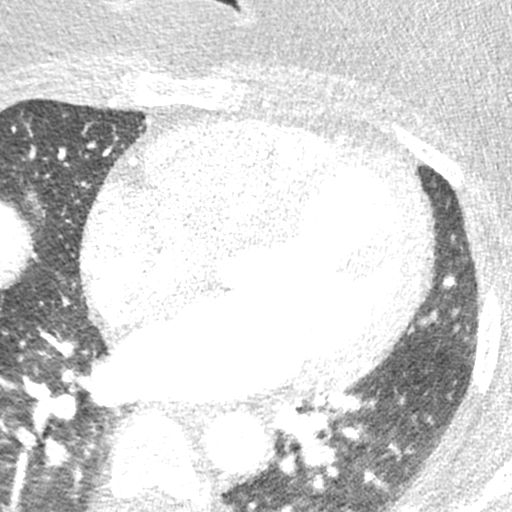

[Series 10: ts syst sharp 37 % · axial · 0.39mm/px · z∈[-205,-162]mm · 2 of 318 slices shown]
[im 106/318  lung]
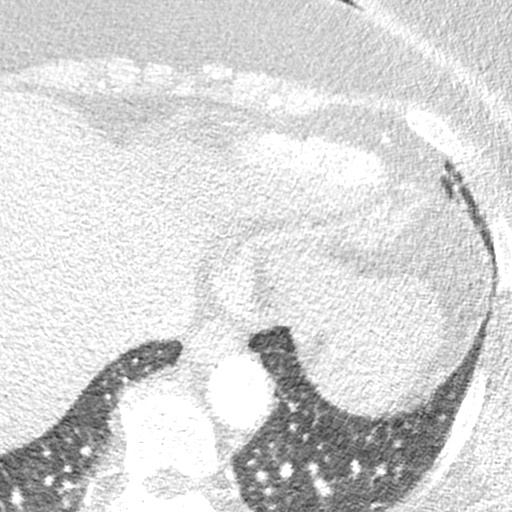
[im 212/318  lung]
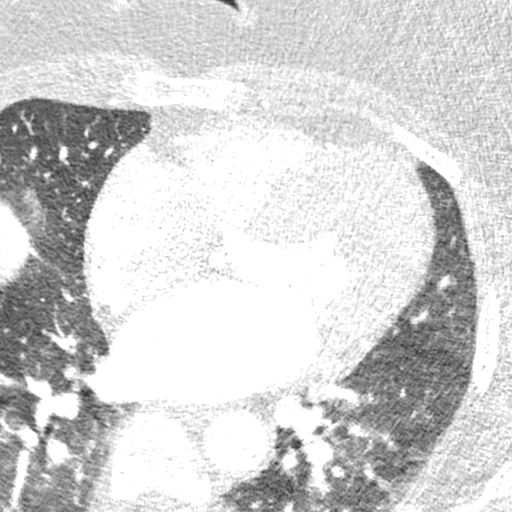

[8 of 20 positions shown; findings below may reference images not displayed]

FINDINGS: Within the visualized portions of the thorax there are no suspicious
appearing pulmonary nodules or masses, there is no acute
consolidative airspace disease, no pleural effusions, no
pneumothorax and no lymphadenopathy. Visualized portions of the
upper abdomen are unremarkable. There are no aggressive appearing
lytic or blastic lesions noted in the visualized portions of the
skeleton.
IMPRESSION: No significant incidental noncardiac findings are noted.
FINDINGS: A 120 kV prospective scan was triggered in the descending thoracic
aorta at 111 HU's. Axial non-contrast 3 mm slices were carried out
through the heart. The data set was analyzed on a dedicated work
station and scored using the Agatson method. Gantry rotation speed
was 250 msecs and collimation was .6 mm. No beta blockade and 0.8 mg
of sl NTG was given. The 3D data set was reconstructed in 5%
intervals of the 67-82 % of the R-R cycle. Diastolic phases were
analyzed on a dedicated work station using MPR, MIP and VRT modes.
The patient received 80 cc of contrast.

Aorta: Normal size.  No calcifications.  No dissection.

Aortic Valve:  Trileaflet.  No calcifications.

Coronary Arteries:  Normal coronary origin.  Right dominance.

RCA is a large dominant artery that gives rise to PDA and PLVB.
There is no plaque.

Left main is a large artery that gives rise to LAD and LCX arteries.

LAD is a large vessel that has no plaque. There is small area of
mild myocardial bridging in the mid LAD.

LCX is a non-dominant artery that gives rise to one large OM1
branch. There is no plaque.

Other findings:

Normal pulmonary vein drainage into the left atrium.

Normal left atrial appendage without a thrombus.

Normal size of the pulmonary artery.
IMPRESSION: 1. Coronary calcium score of 0. This was 0 percentile for age and
sex matched control.

2. Normal coronary origin with right dominance.

3. No evidence of CAD.

4. Mild Mid LAD myocardial bridging.

Stasek Kisur, DO

*** End of Addendum ***
EXAM:
OVER-READ INTERPRETATION  CT CHEST

The following report is an over-read performed by radiologist Dr.
Candace Montellano [REDACTED] on 10/05/2020. This
over-read does not include interpretation of cardiac or coronary
anatomy or pathology. The coronary calcium score/coronary CTA
interpretation by the cardiologist is attached.
FINDINGS: Within the visualized portions of the thorax there are no suspicious
appearing pulmonary nodules or masses, there is no acute
consolidative airspace disease, no pleural effusions, no
pneumothorax and no lymphadenopathy. Visualized portions of the
upper abdomen are unremarkable. There are no aggressive appearing
lytic or blastic lesions noted in the visualized portions of the
skeleton.
IMPRESSION: No significant incidental noncardiac findings are noted.

## 2022-02-27 ENCOUNTER — Ambulatory Visit: Payer: Medicaid Other

## 2022-02-27 LAB — HM DIABETES EYE EXAM

## 2022-05-19 ENCOUNTER — Encounter: Payer: Self-pay | Admitting: Family Medicine

## 2022-11-11 ENCOUNTER — Ambulatory Visit: Payer: Medicaid Other | Admitting: Family Medicine

## 2022-11-18 ENCOUNTER — Ambulatory Visit (INDEPENDENT_AMBULATORY_CARE_PROVIDER_SITE_OTHER): Payer: Medicaid Other | Admitting: Family Medicine

## 2022-11-18 VITALS — BP 116/76 | HR 88 | Temp 96.6°F | Resp 18 | Ht 65.0 in | Wt 292.0 lb

## 2022-11-18 DIAGNOSIS — I5032 Chronic diastolic (congestive) heart failure: Secondary | ICD-10-CM

## 2022-11-18 DIAGNOSIS — E1169 Type 2 diabetes mellitus with other specified complication: Secondary | ICD-10-CM | POA: Diagnosis not present

## 2022-11-18 DIAGNOSIS — Z6841 Body Mass Index (BMI) 40.0 and over, adult: Secondary | ICD-10-CM

## 2022-11-18 DIAGNOSIS — E785 Hyperlipidemia, unspecified: Secondary | ICD-10-CM | POA: Diagnosis not present

## 2022-11-18 DIAGNOSIS — M1712 Unilateral primary osteoarthritis, left knee: Secondary | ICD-10-CM

## 2022-11-18 DIAGNOSIS — G4733 Obstructive sleep apnea (adult) (pediatric): Secondary | ICD-10-CM

## 2022-11-18 HISTORY — DX: Chronic diastolic (congestive) heart failure: I50.32

## 2022-11-18 HISTORY — DX: Unilateral primary osteoarthritis, left knee: M17.12

## 2022-11-18 NOTE — Progress Notes (Addendum)
Acute Office Visit  Subjective:    Patient ID: Virginia Barron, female    DOB: September 26, 1961, 61 y.o.   MRN: [id number]  Chief Complaint  Patient presents with   Knee Pain    Left      HPI: Patient is in today for left knee pain > right knee pain.  Takes tylenol 500 mg as needed.  Unable to walk very far due to knee pain.  Never has had a knee injection.  Long overdue for follow up of hypertension, dm and hyperlipidemia. Has OSA, but has never received CPAP.   Past Medical History:  Diagnosis Date   Chronic diastolic heart failure (HCC) 11/18/2022   Dyslipidemia associated with type 2 diabetes mellitus (HCC) 09/09/2021   Essential hypertension 08/15/2020   Fibroids    Morbid obesity with body mass index (BMI) of 45.0 to 49.9 in adult [facility]) 07/30/2020   Morbid obesity with body mass index (BMI) of 50.0 to 59.9 in adult [facility]) 07/30/2020   OSA (obstructive sleep apnea) 07/30/2020   Primary osteoarthritis of left knee 11/18/2022   Urge incontinence 07/30/2020    Past Surgical History:  Procedure Laterality Date   ABDOMINAL HYSTERECTOMY     precancerous cervical cells and fibroids.   conescopy     abnormal pap   LASIK     TUBAL LIGATION      Family History  Problem Relation Age of Onset   Diabetes Mother    Diabetes Father    Heart attack Father    Diabetes Sister     Social History   Socioeconomic History   Marital status: Divorced    Spouse name: Not on file   Number of children: Not on file   Years of education: Not on file   Highest education level: Not on file  Occupational History   Not on file  Tobacco Use   Smoking status: Former    Types: Cigarettes    Quit date: 1980    Years since quitting: 43.9   Smokeless tobacco: Never  Substance and Sexual Activity   Alcohol use: Yes    Alcohol/week: 1.0 standard drink of alcohol    Types: 1 Standard drinks or equivalent per week    Comment: per month   Drug use: Never   Sexual activity: Not on file   Other Topics Concern   Not on file  Social History Narrative   Not on file   Social Determinants of Health   Financial Resource Strain: Medium Risk (11/20/2022)   Overall Financial Resource Strain (CARDIA)    Difficulty of Paying Living Expenses: Somewhat hard  Food Insecurity: Food Insecurity Present (11/20/2022)   Hunger Vital Sign    Worried About Running Out of Food in the Last Year: Sometimes true    Ran Out of Food in the Last Year: Sometimes true  Transportation Needs: No Transportation Needs (11/20/2022)   PRAPARE - Administrator, Civil Service (Medical): No    Lack of Transportation (Non-Medical): No  Physical Activity: Inactive (11/24/2022)   Exercise Vital Sign    Days of Exercise per Week: 0 days    Minutes of Exercise per Session: 0 min  Stress: Stress Concern Present (11/21/2022)   [employer] of Occupational Health - Occupational Stress Questionnaire    Feeling of Stress : Rather much  Social Connections: Not on file  Intimate Partner Violence: Not on file    Outpatient Medications Prior to Visit  Medication Sig Dispense Refill  cholecalciferol (VITAMIN D3) 25 MCG (1000 UNIT) tablet Take 400 Units by mouth daily.      Multiple Vitamin (MULTIVITAMIN) tablet Take 1 tablet by mouth daily.     amLODipine (NORVASC) 5 MG tablet Take 1 tablet (5 mg total) by mouth daily. Patient need a appointment for future refills. (Patient not taking: Reported on 11/18/2022) 60 tablet 1   aspirin-acetaminophen-caffeine (EXCEDRIN MIGRAINE) 250-250-65 MG tablet Take 1 tablet by mouth every 6 (six) hours as needed for headache. (Patient not taking: Reported on 11/18/2022)     carvedilol (COREG) 3.125 MG tablet Take 1 tablet (3.125 mg total) by mouth 2 (two) times daily with a meal. (Patient not taking: Reported on 11/18/2022) 180 tablet 3   diphenhydrAMINE (BENADRYL) 25 MG tablet Take 25 mg by mouth as needed for allergies. (Patient not taking: Reported on 11/18/2022)      hydrochlorothiazide (HYDRODIURIL) 25 MG tablet Take 1 tablet (25 mg total) by mouth daily. Patient need a appointment for future refills. (Patient not taking: Reported on 11/18/2022) 60 tablet 0   metFORMIN (GLUCOPHAGE) 500 MG tablet Take 1 tablet (500 mg total) by mouth daily with breakfast. (Patient not taking: Reported on 11/18/2022) 90 tablet 1   nitroGLYCERIN (NITROSTAT) 0.4 MG SL tablet Place 0.4 mg under the tongue every 5 (five) minutes as needed for chest pain. (Patient not taking: Reported on 11/18/2022)     oxybutynin (DITROPAN-XL) 5 MG 24 hr tablet Take 1 tablet (5 mg total) by mouth at bedtime. (Patient not taking: Reported on 11/18/2022) 30 tablet 3   rosuvastatin (CRESTOR) 10 MG tablet Take 1 tablet (10 mg total) by mouth daily. (Patient not taking: Reported on 11/18/2022) 90 tablet 1   Turmeric (QC TUMERIC COMPLEX PO) Take 1 capsule by mouth daily. (Patient not taking: Reported on 11/18/2022)     No facility-administered medications prior to visit.    Allergies  Allergen Reactions   Avocado    Iodine Hives, Photosensitivity and Swelling   Kiwi Extract    Penicillins    Pork-Derived Products    Shellfish Allergy    Latex Hives, Itching, Rash and Swelling    Review of Systems  Constitutional:  Negative for chills, fatigue and fever.  HENT:  Negative for congestion, rhinorrhea and sore throat.   Respiratory:  Negative for cough and shortness of breath.   Cardiovascular:  Negative for chest pain.  Gastrointestinal:  Negative for abdominal pain, constipation, diarrhea, nausea and vomiting.  Endocrine: Positive for polydipsia and polyuria. Negative for polyphagia.  Genitourinary:  Negative for dysuria and urgency.  Musculoskeletal:  Positive for arthralgias (left knee pain). Negative for back pain and myalgias.  Neurological:  Positive for dizziness, weakness, light-headedness and headaches (tension headache).  Psychiatric/Behavioral:  Positive for dysphoric mood. The  patient is not nervous/anxious.        Objective:    Physical Exam Vitals reviewed.  Constitutional:      Appearance: Normal appearance. She is obese.  Neck:     Vascular: No carotid bruit.  Cardiovascular:     Rate and Rhythm: Normal rate and regular rhythm.     Heart sounds: Normal heart sounds.  Pulmonary:     Effort: Pulmonary effort is normal. No respiratory distress.     Breath sounds: Normal breath sounds.  Abdominal:     General: Abdomen is flat. Bowel sounds are normal.     Palpations: Abdomen is soft.     Tenderness: There is no abdominal tenderness.  Musculoskeletal:  General: Tenderness (BL knees.) present.  Neurological:     Mental Status: She is alert and oriented to person, place, and time.  Psychiatric:        Mood and Affect: Mood normal.        Behavior: Behavior normal.     BP 116/76   Pulse 88   Temp (!) 96.6 F (35.9 C)   Resp 18   Ht 5\' 5"  (1.651 m)   Wt 292 lb (132.5 kg)   BMI 48.59 kg/m  Wt Readings from Last 3 Encounters:  11/18/22 292 lb (132.5 kg)  09/03/21 294 lb (133.4 kg)  08/28/21 295 lb 6.4 oz (134 kg)    Health Maintenance Due  Topic Date Due   FOOT EXAM  Never done   HIV Screening  Never done   Diabetic kidney evaluation - Urine ACR  Never done   Hepatitis C Screening  Never done   DTaP/Tdap/Td (1 - Tdap) Never done   COLONOSCOPY (Pts 45-22yrs Insurance coverage will need to be confirmed)  Never done   INFLUENZA VACCINE  07/09/2022   COVID-19 Vaccine (4 - 2023-24 season) 08/09/2022   MAMMOGRAM  08/17/2022    There are no preventive care reminders to display for this patient.   Lab Results  Component Value Date   TSH 1.880 11/18/2022   Lab Results  Component Value Date   WBC 8.5 11/18/2022   HGB 13.6 11/18/2022   HCT 44.4 11/18/2022   MCV 75 (L) 11/18/2022   PLT 374 11/18/2022   Lab Results  Component Value Date   NA 144 11/18/2022   K 3.9 11/18/2022   CO2 19 (L) 11/18/2022   GLUCOSE 105 (H)  11/18/2022   BUN 15 11/18/2022   CREATININE 1.02 (H) 11/18/2022   BILITOT 0.3 11/18/2022   ALKPHOS 84 11/18/2022   AST 14 11/18/2022   ALT 16 11/18/2022   PROT 7.5 11/18/2022   ALBUMIN 4.1 11/18/2022   CALCIUM 9.7 11/18/2022   EGFR 62 11/18/2022   Lab Results  Component Value Date   CHOL 153 08/28/2021   Lab Results  Component Value Date   HDL 37 (L) 08/28/2021   Lab Results  Component Value Date   LDLCALC 99 08/28/2021   Lab Results  Component Value Date   TRIG 88 08/28/2021   Lab Results  Component Value Date   CHOLHDL 4.1 08/28/2021   Lab Results  Component Value Date   HGBA1C 6.6 (H) 11/18/2022       Assessment & Plan:   Problem List Items Addressed This Visit       Cardiovascular and Mediastinum   Chronic diastolic heart failure (HCC)    Continue coreg and hydrochlorothiazide.       Relevant Orders   Ambulatory referral to Cardiology     Respiratory   OSA (obstructive sleep apnea)    Order cpap.       Relevant Orders   For home use only DME continuous positive airway pressure (CPAP)     Endocrine   Dyslipidemia associated with type 2 diabetes mellitus (HCC) - Primary    Not fasting. Patient to return for follow up.  Continue to work on eating a healthy diet and exercise.  Labs drawn today.        Relevant Orders   CBC with Differential/Platelet (Completed)   Comprehensive metabolic panel (Completed)   Hemoglobin A1c (Completed)   TSH (Completed)   AMB Referral to Managed Medicaid Care Management  Musculoskeletal and Integument   Primary osteoarthritis of left knee    Risks were discussed including bleeding, infection, increase in sugars if diabetic, atrophy at site of injection, and increased pain.  After consent was obtained, using sterile technique the left knee was prepped with alcohol.  The joint was entered and Kenalog 80 mg and 5 ml plain Lidocaine was then injected and the needle withdrawn.  The procedure was well tolerated.    The patient is asked to continue to rest the joint for a few more days before resuming regular activities.  It may be more painful for the first 1-2 days.  Watch for fever, or increased swelling or persistent pain in the joint. Call or return to clinic prn if such symptoms occur or there is failure to improve as anticipated.       Relevant Medications   triamcinolone acetonide (KENALOG-40) injection 80 mg (Start on 11/26/2022  8:45 PM)     Other   Morbid obesity with body mass index (BMI) of 45.0 to 49.9 in adult [facility])    Recommend continue to work on eating healthy diet and exercise.        Meds ordered this encounter  Medications   triamcinolone acetonide (KENALOG-40) injection 80 mg    Orders Placed This Encounter  Procedures   For home use only DME continuous positive airway pressure (CPAP)   CBC with Differential/Platelet   Comprehensive metabolic panel   Hemoglobin A1c   TSH   AMB Referral to Managed Medicaid Care Management   Ambulatory referral to Cardiology     Follow-up: Return in about 2 weeks (around 12/02/2022) for chronic fasting, 40 minutes please.  An After Visit Summary was printed and given to the patient.  Blane Ohara, MD [facility] [phone]

## 2022-11-19 LAB — CBC WITH DIFFERENTIAL/PLATELET
Basophils Absolute: 0.1 10*3/uL (ref 0.0–0.2)
Basos: 1 %
EOS (ABSOLUTE): 0.2 10*3/uL (ref 0.0–0.4)
Eos: 2 %
Hematocrit: 44.4 % (ref 34.0–46.6)
Hemoglobin: 13.6 g/dL (ref 11.1–15.9)
Immature Grans (Abs): 0 10*3/uL (ref 0.0–0.1)
Immature Granulocytes: 0 %
Lymphocytes Absolute: 2.4 10*3/uL (ref 0.7–3.1)
Lymphs: 28 %
MCH: 22.9 pg — ABNORMAL LOW (ref 26.6–33.0)
MCHC: 30.6 g/dL — ABNORMAL LOW (ref 31.5–35.7)
MCV: 75 fL — ABNORMAL LOW (ref 79–97)
Monocytes Absolute: 0.9 10*3/uL (ref 0.1–0.9)
Monocytes: 11 %
Neutrophils Absolute: 4.9 10*3/uL (ref 1.4–7.0)
Neutrophils: 58 %
Platelets: 374 10*3/uL (ref 150–450)
RBC: 5.94 x10E6/uL — ABNORMAL HIGH (ref 3.77–5.28)
RDW: 14.1 % (ref 11.7–15.4)
WBC: 8.5 10*3/uL (ref 3.4–10.8)

## 2022-11-19 LAB — COMPREHENSIVE METABOLIC PANEL
ALT: 16 IU/L (ref 0–32)
AST: 14 IU/L (ref 0–40)
Albumin/Globulin Ratio: 1.2 (ref 1.2–2.2)
Albumin: 4.1 g/dL (ref 3.9–4.9)
Alkaline Phosphatase: 84 IU/L (ref 44–121)
BUN/Creatinine Ratio: 15 (ref 12–28)
BUN: 15 mg/dL (ref 8–27)
Bilirubin Total: 0.3 mg/dL (ref 0.0–1.2)
CO2: 19 mmol/L — ABNORMAL LOW (ref 20–29)
Calcium: 9.7 mg/dL (ref 8.7–10.3)
Chloride: 106 mmol/L (ref 96–106)
Creatinine, Ser: 1.02 mg/dL — ABNORMAL HIGH (ref 0.57–1.00)
Globulin, Total: 3.4 g/dL (ref 1.5–4.5)
Glucose: 105 mg/dL — ABNORMAL HIGH (ref 70–99)
Potassium: 3.9 mmol/L (ref 3.5–5.2)
Sodium: 144 mmol/L (ref 134–144)
Total Protein: 7.5 g/dL (ref 6.0–8.5)
eGFR: 62 mL/min/{1.73_m2} (ref >59)

## 2022-11-19 LAB — HEMOGLOBIN A1C
Est. average glucose Bld gHb Est-mCnc: 143 mg/dL
Hgb A1c MFr Bld: 6.6 % — ABNORMAL HIGH (ref 4.8–5.6)

## 2022-11-19 LAB — TSH: TSH: 1.88 u[IU]/mL (ref 0.450–4.500)

## 2022-11-19 NOTE — Progress Notes (Signed)
Blood count normal. Add iron studies. Microcytic. Liver function normal.  Kidney function normal.  Thyroid function normal.  HBA1C: 6.6

## 2022-11-20 ENCOUNTER — Telehealth: Payer: Self-pay

## 2022-11-20 ENCOUNTER — Other Ambulatory Visit: Payer: Medicaid Other

## 2022-11-20 NOTE — Patient Instructions (Signed)
Visit Information  Virginia Barron was given information about Medicaid Managed Care team care coordination services as a part of their Healthy [facility] Medicaid benefit. Virginia Barron verbally consented to engagement with the [facility] Managed Care team.   If you are experiencing a medical emergency, please call 911 or report to your local emergency department or urgent care.   If you have a non-emergency medical problem during routine business hours, please contact your provider's office and ask to speak with a nurse.   For questions related to your Healthy [facility] health plan, please call: [phone] or visit the homepage here: [email or web address]  If you would like to schedule transportation through your Healthy [facility] plan, please call the following number at least 2 days in advance of your appointment: [phone]  For information about your ride after you set it up, call Ride Assist at [phone]. Use this number to activate a Will Call pickup, or if your transportation is late for a scheduled pickup. Use this number, too, if you need to make a change or cancel a previously scheduled reservation.  If you need transportation services right away, call [phone]. The after-hours call center is staffed 24 hours to handle ride assistance and urgent reservation requests (including discharges) 365 days a year. Urgent trips include sick visits, hospital discharge requests and life-sustaining treatment.  Call the [place] at [phone], at any time, 24 hours a day, 7 days a week. If you are in danger or need immediate medical attention call 911.  If you would like help to quit smoking, call 1-800-QUIT-NOW 662-051-2259) OR Espaol: 1-855-Djelo-Ya (7-342-876-8115) o para ms informacin haga clic aqu or Text READY to 200-400 to register via text  Virginia Barron - following are the goals we discussed in your visit today:    Goals Addressed   None      Social Worker will follow up on 11/23/22.   Virginia Barron, BSW, [place] Managed Medicaid Team  [phone]   Following is a copy of your plan of care:  There are no care plans that you recently modified to display for this patient.

## 2022-11-20 NOTE — Assessment & Plan Note (Signed)
Risks were discussed including bleeding, infection, increase in sugars if diabetic, atrophy at site of injection, and increased pain.  After consent was obtained, using sterile technique the left knee was prepped with alcohol.  The joint was entered and Kenalog 80 mg and 5 ml plain Lidocaine was then injected and the needle withdrawn.  The procedure was well tolerated.   The patient is asked to continue to rest the joint for a few more days before resuming regular activities.  It may be more painful for the first 1-2 days.  Watch for fever, or increased swelling or persistent pain in the joint. Call or return to clinic prn if such symptoms occur or there is failure to improve as anticipated. 

## 2022-11-20 NOTE — Progress Notes (Signed)
   Care Guide Note  82/42/9980 Name: Virginia Barron MRN: [id number] DOB: October 16, 1961  Referred by: [name], MD Reason for referral : Care Coordination (Outreach to schedule with Pharm D )   Virginia Barron is a 61 y.o. year old female who is a primary care patient of [name], Kirsten, MD. Virginia Barron was referred to the pharmacist for assistance related to DM.    Successful contact was made with the patient to discuss pharmacy services including being ready for the pharmacist to call at least 5 minutes before the scheduled appointment time, to have medication bottles and any blood sugar or blood pressure readings ready for review. The patient agreed to meet with the pharmacist via with the pharmacist via telephone visit on (date/time).  12/16/2022  Virginia Barron, [place], [place] [address] Direct Dial: [phone] [email or web address]

## 2022-11-20 NOTE — Patient Outreach (Signed)
Medicaid Managed Care Social Work Note  25/42/7062 Name:  Virginia Barron MRN:  [id number] DOB:  [birth year]  Laparis Barron is an 61 y.o. year old female who is a primary patient of [name], [name], MD.  The Medicaid Managed Care Coordination team was consulted for assistance with:  [employer]   Ms. Herbst was given information about Medicaid Managed Care Coordination team services today. [name] Patient agreed to services and verbal consent obtained.  Engaged with patient  for by telephone  forinitial visit in response to referral for case management and/or care coordination services.   Assessments/Interventions:  Review of past medical history, allergies, medications, health status, including review of consultants reports, laboratory and other test data, was performed as part of comprehensive evaluation and provision of chronic care management services.  SDOH: (Social Determinant of Health) assessments and interventions performed: BSW completed a telephone outreach with patient. She stated she and her son are living together and receive foodstamps. She does receives Aeronautical engineer month. Patient stated she would like for resources to be mailed and emailed to [email or web address] for food and financial resources.  Advanced Directives Status:  See [employer] application for related entries.  Care Plan                 Allergies  Allergen Reactions   Avocado    Iodine Hives, Photosensitivity and Swelling   Kiwi Extract    Penicillins    Pork-Derived Products    Shellfish Allergy    Latex Hives, Itching, Rash and Swelling    Medications Reviewed Today     Reviewed by [name], RN (Registered Nurse) on 11/18/22 at 80  Med List Status: <None>   Medication Order Taking? Sig Documenting Provider Last Dose Status Informant  amLODipine (NORVASC) 5 MG tablet [id number] No Take 1 tablet (5 mg total) by mouth daily. Patient need a appointment for future  refills.  Patient not taking: Reported on 11/18/2022   [name], DO Not Taking Active   aspirin-acetaminophen-caffeine [facility] MIGRAINE) [phone] MG tablet [id number] No Take 1 tablet by mouth every 6 (six) hours as needed for headache.  Patient not taking: Reported on 11/18/2022   [provider] Not Taking Active   carvedilol (COREG) 3.125 MG tablet [id number] No Take 1 tablet (3.125 mg total) by mouth 2 (two) times daily with a meal.  Patient not taking: Reported on 11/18/2022   [name], DO Not Taking Active   cholecalciferol (VITAMIN D3) 25 MCG (1000 UNIT) tablet [id number] Yes Take 400 Units by mouth daily.  [provider] Taking Active   diphenhydrAMINE (BENADRYL) 25 MG tablet [id number] No Take 25 mg by mouth as needed for allergies.  Patient not taking: Reported on 11/18/2022   [provider] Not Taking Active   hydrochlorothiazide (HYDRODIURIL) 25 MG tablet [id number] No Take 1 tablet (25 mg total) by mouth daily. Patient need a appointment for future refills.  Patient not taking: Reported on 11/18/2022   [name], DO Not Taking Active   metFORMIN (GLUCOPHAGE) 500 MG tablet [id number] No Take 1 tablet (500 mg total) by mouth daily with breakfast.  Patient not taking: Reported on 11/18/2022   [name], MD Not Taking Active   Multiple Vitamin (MULTIVITAMIN) tablet [id number] Yes Take 1 tablet by mouth daily. [provider] Taking Active   nitroGLYCERIN (NITROSTAT) 0.4 MG SL tablet [id number] No Place 0.4 mg under the tongue every 5 (five) minutes as needed for chest pain.  Patient not taking: Reported  on 11/18/2022   [provider] Not Taking Active   oxybutynin (DITROPAN-XL) 5 MG 24 hr tablet [id number] No Take 1 tablet (5 mg total) by mouth at bedtime.  Patient not taking: Reported on 11/18/2022   CoxElnita Maxwell, MD Not Taking Active   rosuvastatin (CRESTOR) 10 MG tablet [id number] No Take 1 tablet (10 mg total) by mouth daily.   Patient not taking: Reported on 11/18/2022   [name], MD Not Taking Active   Turmeric (QC TUMERIC COMPLEX PO) [id number] No Take 1 capsule by mouth daily.  Patient not taking: Reported on 11/18/2022   [provider] Not Taking Active             Patient Active Problem List   Diagnosis Date Noted   Primary osteoarthritis of left knee 11/18/2022   Chronic diastolic heart failure ([place]) 11/18/2022   Dyslipidemia associated with type 2 diabetes mellitus ([place]) 09/09/2021   Essential hypertension 08/15/2020   Fibroids    Urge incontinence 07/30/2020   OSA (obstructive sleep apnea) 07/30/2020   Morbid obesity with body mass index (BMI) of 45.0 to 49.9 in adult [facility]) 07/30/2020    Conditions to be addressed/monitored per PCP order:   community resources  There are no care plans that you recently modified to display for this patient.   Follow up:  Patient agrees to Care Plan and Follow-up.  Plan: The Managed Medicaid care management team will reach out to the patient again over the next 30 days.  Date/time of next scheduled Social Work care management/care coordination outreach:  12/23/22  Mickel Fuchs, Arita Miss, [place] Medicaid Team  [phone]

## 2022-11-21 ENCOUNTER — Other Ambulatory Visit: Payer: Medicaid Other | Admitting: Licensed Clinical Social Worker

## 2022-11-21 NOTE — Patient Instructions (Signed)
Visit Information  Virginia Barron was given information about Medicaid Managed Care team care coordination services as a part of their Healthy [facility] Medicaid benefit. Virginia Barron verbally consented to engagement with the [facility] Managed Care team.   If you are experiencing a medical emergency, please call 911 or report to your local emergency department or urgent care.   If you have a non-emergency medical problem during routine business hours, please contact your provider's office and ask to speak with a nurse.   For questions related to your Healthy [facility] health plan, please call: [phone] or visit the homepage here: [email or web address]  If you would like to schedule transportation through your Healthy [facility] plan, please call the following number at least 2 days in advance of your appointment: [phone]  For information about your ride after you set it up, call Ride Assist at [phone]. Use this number to activate a Will Call pickup, or if your transportation is late for a scheduled pickup. Use this number, too, if you need to make a change or cancel a previously scheduled reservation.  If you need transportation services right away, call [phone]. The after-hours call center is staffed 24 hours to handle ride assistance and urgent reservation requests (including discharges) 365 days a year. Urgent trips include sick visits, hospital discharge requests and life-sustaining treatment.  Call the [facility] at [phone], at any time, 24 hours a day, 7 days a week. If you are in danger or need immediate medical attention call 911.  If you would like help to quit smoking, call 1-800-QUIT-NOW 831-400-7048) OR Espaol: 1-855-Djelo-Ya (4-315-400-8676) o para ms informacin haga clic aqu or Text READY to 200-400 to register via text  Following is a copy of your plan of care:  Care Plan : LCSW Plan of Care   Updates made by Greg Cutter, LCSW since 11/21/2022 12:00 AM     Problem: Depression Identification (Depression)      Long-Range Goal: Depressive Symptoms Identified   Start Date: 11/21/2022  Note:   Priority: High  Timeframe:  Long-Range Goal Priority:  High Start Date:   11/21/22            Expected End Date:  ongoing                     Follow Up Date--11/28/22 at 115 pm  - keep 5 percent of scheduled appointments -consider counseling or psychiatry -consider bumping up your self-care  -consider creating a stronger support network   Why is this important?             Combatting depression may take some time.            If you don't feel better right away, don't give up on your treatment plan.    Current barriers:   Chronic Mental Health needs related to depression and stress. Patient requires Support, Education, Resources, Referrals, Advocacy, and Care Coordination, in order to meet Unmet Mental Health Needs. Limited support network Patient will implement clinical interventions discussed today to decrease symptoms of depression and increase knowledge and/or ability of: coping skills. Mental Health Concerns and Social Isolation Patient lacks knowledge of available community counseling agencies and resources.  Clinical Goal(s): verbalize understanding of plan for management of Depression, and Stress and demonstrate a reduction in symptoms. Patient will connect with a provider for ongoing mental health treatment, increase coping skills, healthy habits, self-management skills, and stress reduction  Patient Goals/Self-Care Activities: Over the next 120 days Attend scheduled medical appointments Utilize healthy coping skills and supportive resources discussed Contact PCP with any questions or concerns Keep 90 percent of counseling appointments Call your insurance provider for more information about your Enhanced Benefits  Check out counseling resources provided   Begin personal counseling with LCSW, to reduce and manage symptoms of Depression and Stress, until well-established with mental health provider Accept all calls from representative with [facility] in an effort to establish ongoing mental health counseling and supportive services. Incorporate into daily practice - relaxation techniques, deep breathing exercises, and mindfulness meditation strategies. Talk about feelings with friends, family members, spiritual advisor, etc. Contact LCSW directly [phone]), if you have questions, need assistance, or if additional social work needs are identified between now and our next scheduled telephone outreach call. Call 988 for mental health hotline/crisis line if needed (24/7 available) Try techniques to reduce symptoms of anxiety/negative thinking (deep breathing, distraction, positive self talk, etc)  - develop a personal safety plan - develop a plan to deal with triggers like holidays, anniversaries - exercise at least 2 to 3 times per week - have a plan for how to handle bad days - journal feelings and what helps to feel better or worse - spend time or talk with others at least 2 to 3 times per week - watch for early signs of feeling worse - begin personal counseling - call and visit an old friend - check out volunteer opportunities - join a support group - laugh; watch a funny movie or comedian - learn and use visualization or guided imagery - perform a random act of kindness - practice relaxation or meditation daily - start or continue a personal journal - practice positive thinking and self-talk -continue with compliance of taking medication  -identify current effective and ineffective coping strategies.  -implement positive self-talk in care to increase self-esteem, confidence and feelings of control.  -consider alternative and complementary therapy approaches such as meditation, mindfulness or yoga.  -journaling,  prayer, worship services, meditation or pastoral counseling.  -increase participation in pleasurable group activities such as hobbies, singing, sports or volunteering).  -consider the use of meditative movement therapy such as tai chi, yoga or qigong.  -start a regular daily exercise program based on tolerance, ability and patient choice to support positive thinking and activity     10 LITTLE Things To Do When You're Feeling Too Down To Do Anything  Take a shower. Even if you plan to stay in all day long and not see a soul, take a shower. It takes the most effort to hop in to the shower but once you do, you'll feel immediate results. It will wake you up and you'll be feeling much fresher (and cleaner too).  Brush and floss your teeth. Give your teeth a good brushing with a floss finish. It's a small task but it feels so good and you can check 'taking care of your health' off the list of things to do.  Do something small on your list. Most of [place] have some small thing we would like to get done (load of laundry, sew a button, email a friend). Doing one of these things will make you feel like you've accomplished something.  Drink water. Drinking water is easy right? It's also really beneficial for your health so keep a glass beside you all day and take sips often. It gives you energy and prevents you from boredom eating.  Do  some floor exercises. The last thing you want to do is exercise but it might be just the thing you need the most. Keep it simple and do exercises that involve sitting or laying on the floor. Even the smallest of exercises release chemicals in the brain that make you feel good. Yoga stretches or core exercises are going to make you feel good with minimal effort.  Make your bed. Making your bed takes a few minutes but it's productive and you'll feel relieved when it's done. An unmade bed is a huge visual reminder that you're having an unproductive day. Do it and consider it  your housework for the day.  Put on some nice clothes. Take the sweatpants off even if you don't plan to go anywhere. Put on clothes that make you feel good. Take a look in the mirror so your brain recognizes the sweatpants have been replaced with clothes that make you look great. It's an instant confidence booster.  Wash the dishes. A pile of dirty dishes in the sink is a reflection of your mood. It's possible that if you wash up the dishes, your mood will follow suit. It's worth a try.  Cook a real meal. If you have the luxury to have a "do nothing" day, you have time to make a real meal for yourself. Make a meal that you love to eat. The process is good to get you out of the funk and the food will ensure you have more energy for tomorrow.  Write out your thoughts by hand. When you hand write, you stimulate your brain to focus on the moment that you're in so make yourself comfortable and write whatever comes into your mind. Put those thoughts out on paper so they stop spinning around in your head. Those thoughts might be the very thing holding you down.  If you are experiencing a Mental Health or Alligator or need someone to talk to, please call the Suicide and Crisis Lifeline: 988    Patient Goals: Initial goal

## 2022-11-21 NOTE — Patient Outreach (Signed)
Medicaid Managed Care Social Work Note  10/93/2355 Name:  Jaline Jindra MRN:  [id number] DOB:  [birth year]  Harumi Kleine is an 61 y.o. year old female who is a primary patient of [name], Kirsten, MD.  The Medicaid Managed Care Coordination team was consulted for assistance with:  [place] and Resources  Ms. Shadowens was given information about Medicaid Managed Care Coordination team services today. Edom Pfahler Patient agreed to services and verbal consent obtained.  Engaged with patient  for by telephone forinitial visit in response to referral for case management and/or care coordination services.   Assessments/Interventions:  Review of past medical history, allergies, medications, health status, including review of consultants reports, laboratory and other test data, was performed as part of comprehensive evaluation and provision of chronic care management services.  SDOH: (Social Determinant of Health) assessments and interventions performed: SDOH Interventions    Flowsheet Row Patient Outreach Telephone from 11/21/2022 in [place]  SDOH Interventions   Depression Interventions/Treatment  Referral to Psychiatry  Stress Interventions Provide Counseling, Offered [employer] Resources       Advanced Directives Status:  See Care Plan for related entries.  Care Plan                 Allergies  Allergen Reactions   Avocado    Iodine Hives, Photosensitivity and Swelling   Kiwi Extract    Penicillins    Pork-Derived Products    Shellfish Allergy    Latex Hives, Itching, Rash and Swelling    Medications Reviewed Today     Reviewed by Greg Cutter, LCSW (Social Worker) on 11/21/22 at 1008  Med List Status: <None>   Medication Order Taking? Sig Documenting Provider Last Dose Status Informant  amLODipine (NORVASC) 5 MG tablet [id number] No Take 1 tablet (5 mg total) by mouth daily. Patient need a appointment for future  refills.  Patient not taking: Reported on 11/18/2022   [name], DO Not Taking Active   aspirin-acetaminophen-caffeine [facility] MIGRAINE) [phone] MG tablet [id number] No Take 1 tablet by mouth every 6 (six) hours as needed for headache.  Patient not taking: Reported on 11/18/2022   [provider] Not Taking Active   carvedilol (COREG) 3.125 MG tablet [id number] No Take 1 tablet (3.125 mg total) by mouth 2 (two) times daily with a meal.  Patient not taking: Reported on 11/18/2022   [name], DO Not Taking Active   cholecalciferol (VITAMIN D3) 25 MCG (1000 UNIT) tablet [id number] No Take 400 Units by mouth daily.  [provider] Taking Active   diphenhydrAMINE (BENADRYL) 25 MG tablet [id number] No Take 25 mg by mouth as needed for allergies.  Patient not taking: Reported on 11/18/2022   [provider] Not Taking Active   hydrochlorothiazide (HYDRODIURIL) 25 MG tablet [id number] No Take 1 tablet (25 mg total) by mouth daily. Patient need a appointment for future refills.  Patient not taking: Reported on 11/18/2022   [name], DO Not Taking Active   metFORMIN (GLUCOPHAGE) 500 MG tablet [id number] No Take 1 tablet (500 mg total) by mouth daily with breakfast.  Patient not taking: Reported on 11/18/2022   [name], MD Not Taking Active   Multiple Vitamin (MULTIVITAMIN) tablet [id number] No Take 1 tablet by mouth daily. [provider] Taking Active   nitroGLYCERIN (NITROSTAT) 0.4 MG SL tablet [id number] No Place 0.4 mg under the tongue every 5 (five) minutes as needed for chest pain.  Patient not taking: Reported on  11/18/2022   [provider] Not Taking Active   oxybutynin (DITROPAN-XL) 5 MG 24 hr tablet [id number] No Take 1 tablet (5 mg total) by mouth at bedtime.  Patient not taking: Reported on 11/18/2022   CoxElnita Maxwell, MD Not Taking Active   rosuvastatin (CRESTOR) 10 MG tablet [id number] No Take 1 tablet (10 mg total) by mouth daily.   Patient not taking: Reported on 11/18/2022   [name], MD Not Taking Active   Turmeric (QC TUMERIC COMPLEX PO) [id number] No Take 1 capsule by mouth daily.  Patient not taking: Reported on 11/18/2022   [provider] Not Taking Active             Patient Active Problem List   Diagnosis Date Noted   Primary osteoarthritis of left knee 11/18/2022   Chronic diastolic heart failure ([place]) 11/18/2022   Dyslipidemia associated with type 2 diabetes mellitus ([place]) 09/09/2021   Essential hypertension 08/15/2020   Fibroids    Urge incontinence 07/30/2020   OSA (obstructive sleep apnea) 07/30/2020   Morbid obesity with body mass index (BMI) of 45.0 to 49.9 in adult [facility]) 07/30/2020    Conditions to be addressed/monitored per PCP order:  Depression  Care Plan : LCSW Plan of Care  Updates made by Greg Cutter, LCSW since 11/21/2022 12:00 AM     Problem: Depression Identification (Depression)      Long-Range Goal: Depressive Symptoms Identified   Start Date: 11/21/2022  Note:   Priority: High  Timeframe:  Long-Range Goal Priority:  High Start Date:   11/21/22            Expected End Date:  ongoing                     Follow Up Date--11/28/22 at 115 pm  - keep 90 percent of scheduled appointments -consider counseling or psychiatry -consider bumping up your self-care  -consider creating a stronger support network   Why is this important?             Combatting depression may take some time.            If you don't feel better right away, don't give up on your treatment plan.    Current barriers:   Chronic Mental Health needs related to depression and stress. Patient requires Support, Education, Resources, Referrals, Advocacy, and Care Coordination, in order to meet Unmet Mental Health Needs. Limited support network Patient will implement clinical interventions discussed today to decrease symptoms of depression and increase knowledge and/or ability of: coping  skills. Mental Health Concerns and Social Isolation Patient lacks knowledge of available community counseling agencies and resources.  Clinical Goal(s): verbalize understanding of plan for management of Depression, and Stress and demonstrate a reduction in symptoms. Patient will connect with a provider for ongoing mental health treatment, increase coping skills, healthy habits, self-management skills, and stress reduction        Clinical Interventions:  Assessed patient's previous and current treatment, coping skills, support system and barriers to care. Patient provided hx  Verbalization of feelings encouraged, motivational interviewing employed Emotional support provided, positive coping strategies explored. Establishing healthy boundaries emphasized and healthy self-care education provided Patient was educated on available mental health resources within their area that accept Medicaid and offer counseling and psychiatry. Patient reports significant worsening depression and stress impacting her ability to function appropriately and carry out daily task. Patient reports never having mental health support. 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 She reports that she has a close friend [name] that lives in [place] that she talks to on the phone to gain socialization.  Patient is agreeable to [facility] to [facility]. Patient prefers virtual counseling. [facility] LCSW made referral on 11/21/22. LCSW provided education on relaxation techniques such as meditation, deep breathing, massage, grounding exercises or yoga that can activate the body's relaxation response and ease symptoms of stress and anxiety. LCSW ask that when pt is struggling with difficult emotions and racing thoughts that they start this relaxation response process. LCSW provided extensive education on healthy coping skills for anxiety. SW used active and reflective listening, validated  patient's feelings/concerns, and provided emotional support. Patient will work on implementing appropriate self-care habits into their daily routine such as: staying positive, writing a gratitude list, drinking water, staying active around the house, taking their medications as prescribed, combating negative thoughts or emotions and staying connected with their family and friends. Positive reinforcement provided for this decision to work on this. Patient reports that they experience both anxiety and depression. He shares that he feels alone most days. He was receptive to anxiety and depression management coping skill education.  Motivational Interviewing employed Depression screen reviewed  PHQ2/ PHQ9 completed or reviewed  Mindfulness or Relaxation training provided Active listening / Reflection utilized  Advance Care and HCPOA education provided Emotional Support Provided Problem [place] strategies reviewed Provided psychoeducation for mental health needs  Provided brief CBT  Reviewed mental health medications and discussed importance of compliance:  Quality of sleep assessed & Sleep Hygiene techniques promoted  Participation in counseling encouraged  Verbalization of feelings encouraged  Suicidal Ideation/Homicidal Ideation assessed: Patient denies SI/HI  Review resources, discussed options and provided patient information about  [place] care team collaboration (see longitudinal plan of care) Patient Goals/Self-Care Activities: Over the next 120 days Attend scheduled medical appointments Utilize healthy coping skills and supportive resources discussed Contact PCP with any questions or concerns Keep 90 percent of counseling appointments Call your insurance provider for more information about your Enhanced Benefits  Check out counseling resources provided  Begin personal counseling with LCSW, to reduce and manage symptoms of Depression and  Stress, until well-established with mental health provider Accept all calls from representative with [facility] [place] in an effort to establish ongoing mental health counseling and supportive services. Incorporate into daily practice - relaxation techniques, deep breathing exercises, and mindfulness meditation strategies. Talk about feelings with friends, family members, spiritual advisor, etc. Contact LCSW directly [phone]), if you have questions, need assistance, or if additional social work needs are identified between now and our next scheduled telephone outreach call. Call [phone] for mental health hotline/crisis line if needed (24/7 available) Try techniques to reduce symptoms of anxiety/negative thinking (deep breathing, distraction, positive self talk, etc)  - develop a personal safety plan - develop a plan to deal with triggers like holidays, anniversaries - exercise at least 2 to 3 times per week - have a plan for how to handle bad days - journal feelings and what helps to feel better or worse - spend time or talk with others at least 2 to 3 times per week - watch for early signs of feeling worse - begin personal counseling - call and visit an old friend - check out volunteer opportunities - join a support group - laugh; watch a funny movie or comedian - learn and use visualization or guided imagery -  perform a random act of kindness - practice relaxation or meditation daily - start or continue a personal journal - practice positive thinking and self-talk -continue with compliance of taking medication  -identify current effective and ineffective coping strategies.  -implement positive self-talk in care to increase self-esteem, confidence and feelings of control.  -consider alternative and complementary therapy approaches such as meditation, mindfulness or yoga.  -journaling, prayer, worship services, meditation or pastoral counseling.  -increase participation in  pleasurable group activities such as hobbies, singing, sports or volunteering).  -consider the use of meditative movement therapy such as tai chi, yoga or qigong.  -start a regular daily exercise program based on tolerance, ability and patient choice to support positive thinking and activity       11/21/2022   10:07 AM 11/18/2022    3:42 PM 04/08/2021    7:58 PM 10/23/2020   12:08 PM  Depression screen PHQ 2/9  Decreased Interest 1 1 0 0  Down, Depressed, Hopeless 1 0 0 2  PHQ - 2 Score 2 1 0 2  Altered sleeping 2 2  0  Tired, decreased energy _0 Change in appetite 0 0  2  Feeling bad or failure about yourself  0 0  0  Trouble concentrating 1 1  0  Moving slowly or fidgety/restless 0 0    Suicidal thoughts 0 0  1  PHQ-9 Score _1 Difficult doing work/chores Somewhat difficult Somewhat difficult  Somewhat difficult   10 LITTLE Things To Do When You're Feeling Too Down To Do Anything  Take a shower. Even if you plan to stay in all day long and not see a soul, take a shower. It takes the most effort to hop in to the shower but once you do, you'll feel immediate results. It will wake you up and you'll be feeling much fresher (and cleaner too).  Brush and floss your teeth. Give your teeth a good brushing with a floss finish. It's a small task but it feels so good and you can check 'taking care of your health' off the list of things to do.  Do something small on your list. Most of [place] have some small thing we would like to get done (load of laundry, sew a button, email a friend). Doing one of these things will make you feel like you've accomplished something.  Drink water. Drinking water is easy right? It's also really beneficial for your health so keep a glass beside you all day and take sips often. It gives you energy and prevents you from boredom eating.  Do some floor exercises. The last thing you want to do is exercise but it might be just the thing you need the most. Keep  it simple and do exercises that involve sitting or laying on the floor. Even the smallest of exercises release chemicals in the brain that make you feel good. Yoga stretches or core exercises are going to make you feel good with minimal effort.  Make your bed. Making your bed takes a few minutes but it's productive and you'll feel relieved when it's done. An unmade bed is a huge visual reminder that you're having an unproductive day. Do it and consider it your housework for the day.  Put on some nice clothes. Take the sweatpants off even if you don't plan to go anywhere. Put on clothes that make you feel good. Take a look in the mirror so your brain recognizes the  sweatpants have been replaced with clothes that make you look great. It's an instant confidence booster.  Wash the dishes. A pile of dirty dishes in the sink is a reflection of your mood. It's possible that if you wash up the dishes, your mood will follow suit. It's worth a try.  Cook a real meal. If you have the luxury to have a "do nothing" day, you have time to make a real meal for yourself. Make a meal that you love to eat. The process is good to get you out of the funk and the food will ensure you have more energy for tomorrow.  Write out your thoughts by hand. When you hand write, you stimulate your brain to focus on the moment that you're in so make yourself comfortable and write whatever comes into your mind. Put those thoughts out on paper so they stop spinning around in your head. Those thoughts might be the very thing holding you down.  If you are experiencing a Mental Health or [place] or need someone to talk to, please call the Suicide and Crisis Lifeline: 988    Patient Goals: Initial goal     The following coping skill education was provided for stress relief and mental health management: "When your car dies or a deadline looms, how do you respond? Long-term, low-grade or acute stress takes a serious  toll on your body and mind, so don't ignore feelings of constant tension. Stress is a natural part of life. However, too much stress can harm our health, especially if it continues every day. This is chronic stress and can put you at risk for heart problems like heart disease and depression. Understand what's happening inside your body and learn simple coping skills to combat the negative impacts of everyday stressors.  Types of Stress There are two types of stress: Emotional - types of emotional stress are relationship problems, pressure at work, financial worries, experiencing discrimination or having a major life change. Physical - Examples of physical stress include being sick having pain, not sleeping well, recovery from an injury or having an alcohol and drug use disorder. Fight or Flight Sudden or ongoing stress activates your nervous system and floods your bloodstream with adrenaline and cortisol, two hormones that raise blood pressure, increase heart rate and spike blood sugar. These changes pitch your body into a fight or flight response. That enabled our ancestors to outrun saber-toothed tigers, and it's helpful today for situations like dodging a car accident. But most modern chronic stressors, such as finances or a challenging relationship, keep your body in that heightened state, which hurts your health. Effects of Too Much Stress If constantly under stress, most of [place] will eventually start to function less well.  Multiple studies link chronic stress to a higher risk of heart disease, stroke, depression, weight gain, memory loss and even premature death, so it's important to recognize the warning signals. Talk to your doctor about ways to manage stress if you're experiencing any of these symptoms: Prolonged periods of poor sleep. Regular, severe headaches. Unexplained weight loss or gain. Feelings of isolation, withdrawal or worthlessness. Constant anger and irritability. Loss of  interest in activities. Constant worrying or obsessive thinking. Excessive alcohol or drug use. Inability to concentrate.  10 Ways to Cope with Chronic Stress It's key to recognize stressful situations as they occur because it allows you to focus on managing how you react. We all need to know when to close our eyes and take  a deep breath when we feel tension rising. Use these tips to prevent or reduce chronic stress. 1. Rebalance Work and Home All work and no play? If you're spending too much time at the office, intentionally put more dates in your calendar to enjoy time for fun, either alone or with others. 2. Get Regular Exercise Moving your body on a regular basis balances the nervous system and increases blood circulation, helping to flush out stress hormones. Even a daily 20-minute walk makes a difference. Any kind of exercise can lower stress and improve your mood ? just pick activities that you enjoy and make it a regular habit. 3. Eat Well and Limit Alcohol and Stimulants Alcohol, nicotine and caffeine may temporarily relieve stress but have negative health impacts and can make stress worse in the long run. Well-nourished bodies cope better, so start with a good breakfast, add more organic fruits and vegetables for a well-balanced diet, avoid processed foods and sugar, try herbal tea and drink more water. 4. Connect with Supportive People Talking face to face with another person releases hormones that reduce stress. Lean on those good listeners in your life. 5. [place] Time Do you enjoy gardening, reading, listening to music or some other creative pursuit? Engage in activities that bring you pleasure and joy; research shows that reduces stress by almost half and lowers your heart rate, too. 6. Practice Meditation, Stress Reduction or Yoga Relaxation techniques activate a state of restfulness that counterbalances your body's fight-or-flight hormones. Even if this also means a  10-minute break in a long day: listen to music, read, go for a walk in nature, do a hobby, take a bath or spend time with a friend. Also consider doing a mindfulness exercise or try a daily deep breathing or imagery practice. Deep Breathing Slow, calm and deep breathing can help you relax. Try these steps to focus on your breathing and repeat as needed. Find a comfortable position and close your eyes. Exhale and drop your shoulders. Breathe in through your nose; fill your lungs and then your belly. Think of relaxing your body, quieting your mind and becoming calm and peaceful. Breathe out slowly through your nose, relaxing your belly. Think of releasing tension, pain, worries or distress. Repeat steps three and four until you feel relaxed. Imagery This involves using your mind to excite the senses -- sound, vision, smell, taste and feeling. This may help ease your stress. Begin by getting comfortable and then do some slow breathing. Imagine a place you love being at. It could be somewhere from your childhood, somewhere you vacationed or just a place in your imagination. Feel how it is to be in the place you're imagining. Pay attention to the sounds, air, colors, and who is there with you. This is a place where you feel cared for and loved. All is well. You are safe. Take in all the smells, sounds, tastes and feelings. As you do, feel your body being nourished and healed. Feel the calm that surrounds you. Breathe in all the good. Breathe out any discomfort or tension. 7. Sleep Enough If you get less than seven to eight hours of sleep, your body won't tolerate stress as well as it could. If stress keeps you up at night, address the cause, and add extra meditation into your day to make up for the lost z's. Try to get seven to nine hours of sleep each night. Make a regular bedtime schedule. Keep your room dark and cool. Try  to avoid computers, TV, cell phones and tablets before bed. 8. Bond with  Connections You Enjoy Go out for a coffee with a friend, chat with a neighbor, call a family member, visit with a clergy member, or even hang out with your pet. Clinical studies show that spending even a short time with a companion animal can cut anxiety levels almost in half. 9. Take a Vacation Getting away from it all can reset your stress tolerance by increasing your mental and emotional outlook, which makes you a happier, more productive person upon return. Leave your cellphone and laptop at home! 10. See a Counselor, Coach or Therapist If negative thoughts overwhelm your ability to make positive changes, it's time to seek professional help. Make an appointment today--your health and life are worth it."   Follow up:  Patient agrees to Care Plan and Follow-up.  Plan: The Managed Medicaid care management team will reach out to the patient again over the next 30 days.  Date/time of next scheduled Social Work care management/care coordination outreach:  11/28/22 at 115 pm  Eula Fried, [place], MSW, [place] Medicaid LCSW [email or web address] Phone: [phone]

## 2022-11-24 ENCOUNTER — Other Ambulatory Visit: Payer: Self-pay | Admitting: Family Medicine

## 2022-11-24 ENCOUNTER — Encounter: Payer: Self-pay | Admitting: Family Medicine

## 2022-11-24 NOTE — Assessment & Plan Note (Signed)
Continue coreg and hydrochlorothiazide.

## 2022-11-24 NOTE — Assessment & Plan Note (Signed)
Recommend continue to work on eating healthy diet and exercise.  

## 2022-11-24 NOTE — Addendum Note (Signed)
Addended byRochel Brome on: 11/24/2022 03:49 PM   Modules accepted: Orders

## 2022-11-24 NOTE — Assessment & Plan Note (Signed)
Not fasting. Patient to return for follow up.  Continue to work on eating a healthy diet and exercise.  Labs drawn today.

## 2022-11-24 NOTE — Assessment & Plan Note (Signed)
Order cpap.

## 2022-11-25 NOTE — Addendum Note (Signed)
Addended byRochel Brome on: 11/25/2022 11:24 PM   Modules accepted: Orders

## 2022-11-26 ENCOUNTER — Other Ambulatory Visit: Payer: Medicaid Other | Admitting: *Deleted

## 2022-11-26 DIAGNOSIS — F39 Unspecified mood [affective] disorder: Secondary | ICD-10-CM | POA: Diagnosis not present

## 2022-11-26 MED ORDER — TRIAMCINOLONE ACETONIDE 40 MG/ML IJ SUSP: 80.0000 mg | Freq: Once | INTRAMUSCULAR | Status: DC

## 2022-11-26 NOTE — Patient Outreach (Signed)
Medicaid Managed Care   Nurse Care Manager Note  85/88/5027 Name:  Virginia Barron MRN:  [id number] DOB:  05/15/2094  Virginia Barron is an 61 y.o. year old female who is a primary patient of [name], [name], MD.  The Medicaid Managed Care Coordination team was consulted for assistance with:    CHF DMII  [name] was given information about Medicaid Managed Care Coordination team services today. Virginia Barron Patient agreed to services and verbal consent obtained.  Engaged with patient by telephone for initial visit in response to provider referral for case management and/or care coordination services.   Assessments/Interventions:  Review of past medical history, allergies, medications, health status, including review of consultants reports, laboratory and other test data, was performed as part of comprehensive evaluation and provision of chronic care management services.  SDOH (Social Determinants of Health) assessments and interventions performed: SDOH Interventions    Flowsheet Row Patient Outreach Telephone from 11/26/2022 in [place] Patient Outreach Telephone from 11/21/2022 in [place]  SDOH Interventions    Alcohol Usage Interventions Intervention Not Indicated (Score <7) --  Depression Interventions/Treatment  -- Referral to Psychiatry  Stress Interventions -- Provide Counseling, Offered [employer] Resources       Care Plan  Allergies  Allergen Reactions   Avocado    Iodine Hives, Photosensitivity and Swelling   Kiwi Extract    Penicillins    Pork-Derived Products    Shellfish Allergy    Latex Hives, Itching, Rash and Swelling    Medications Reviewed Today     Reviewed by [name], RN (Registered Nurse) on 11/26/22 at [phone]  Med List Status: <None>   Medication Order Taking? Sig Documenting Provider Last Dose Status Informant  cholecalciferol (VITAMIN D3) 25 MCG (1000 UNIT) tablet  [id number] Yes Take 400 Units by mouth daily.  [provider] Taking Active   Multiple Vitamin (MULTIVITAMIN) tablet [id number] Yes Take 1 tablet by mouth daily. [provider] Taking Active             Patient Active Problem List   Diagnosis Date Noted   Morbid obesity with body mass index (BMI) of 50.0 to 59.9 in adult [facility]) 11/26/2022   Primary osteoarthritis of left knee 11/18/2022   Chronic diastolic heart failure ([place]) 11/18/2022   Dyslipidemia associated with type 2 diabetes mellitus ([place]) 09/09/2021   Essential hypertension 08/15/2020   Fibroids    Urge incontinence 07/30/2020   OSA (obstructive sleep apnea) 07/30/2020   Morbid obesity with body mass index (BMI) of 45.0 to 49.9 in adult [facility]) 07/30/2020    Conditions to be addressed/monitored per PCP order:  CHF and DMII  Care Plan : RN Care Manager Plan of Care  Updates made by [name], RN since 11/26/2022 12:00 AM     Problem: Health Management needs related to CHF and DMII      Long-Range Goal: Development of Plan of Care to address Health Management needs related to CHF and DMII   Start Date: 11/26/2022  Expected End Date: 03/26/2023  Note:   Current Barriers:  Chronic Disease Management support and education needs related to CHF and DMII  Patient has not been taking any prescribed medications for the last year for CHF and DMII, due to financial difficulty-limited insurance. She did not follow up with Cardiology and PCP. She reports increased SOB with walking over the last year. She is unable to walk to the grocery store(1/2 mile) like she  was previously able to do. She has not been wearing her CPAP, due to contamination, a new CPAP was requested by PCP.   RNCM Clinical Goal(s):  Patient will verbalize understanding of plan for management of CHF and DMII as evidenced by patient reports take all medications exactly as prescribed and will call provider for medication related questions as  evidenced by patient reports and EMR documentation    attend all scheduled medical appointments: 11/27/22 with Cardiology and 12/05/22 with PCP as evidenced by provider documentation        continue to work with [place] and/or Social Worker to address care management and care coordination needs related to CHF and DMII as evidenced by adherence to CM Team Scheduled appointments       Interventions: Inter-disciplinary care team collaboration (see longitudinal plan of care) Evaluation of current treatment plan related to  self management and patient's adherence to plan as established by provider Collaborate with BSW for resources for pest control and non medical transportation Advised patient to contact [employer] [phone] for Member Benefits   Heart Failure Interventions:  (Status: New goal.)  Long Term Goal  Wt Readings from Last 3 Encounters:  11/18/22 292 lb (132.5 kg)  09/03/21 294 lb (133.4 kg)  08/28/21 295 lb 6.4 oz (134 kg)   Basic overview and discussion of pathophysiology of Heart Failure reviewed Reviewed Heart Failure Action Plan in depth and provided written copy Reviewed role of diuretics in prevention of fluid overload and management of heart failure Discussed the importance of keeping all appointments with provider Provided patient with education about the role of exercise in the management of heart failure Assessed social determinant of health barriers Advised patient to request 90 day refills for any appropriate medications Collaborate with PCP regarding CPAP Advised patient to express any concerns, questions or changes in her health with provider  Diabetes:  (Status: New goal.) Long Term Goal   Lab Results  Component Value Date   HGBA1C 6.6 (H) 11/18/2022   @ Assessed patient's understanding of A1c goal: <7% Provided education to patient about basic DM disease process; Reviewed prescribed diet with patient diabetic diet; Counseled  on importance of regular laboratory monitoring as prescribed;        Discussed plans with patient for ongoing care management follow up and provided patient with direct contact information for care management team;      Reviewed scheduled/upcoming provider appointments including: Cardiology on 11/27/22 and PCP on 12/05/22;         Review of patient status, including review of consultants reports, relevant laboratory and other test results, and medications completed;       Assessed social determinant of health barriers;         Patient Goals/Self-Care Activities: Take medications as prescribed   Attend all scheduled provider appointments Call provider office for new concerns or questions  Work with the social worker to address care coordination needs and will continue to work with the clinical team to address health care and disease management related needs use salt in moderation watch for swelling in feet, ankles and legs every day eat more whole grains, fruits and vegetables, lean meats and healthy fats dress right for the weather, hot or cold keep appointment with eye doctor check feet daily for cuts, sores or redness drink 6 to 8 glasses of water each day fill half of plate with vegetables switch to sugar-free drinks       Follow Up:  Patient  agrees to Care Plan and Follow-up.  Plan: The Managed Medicaid care management team will reach out to the patient again over the next 14 days.  Date/time of next scheduled RN care management/care coordination outreach:  12/12/22 @ [place] RN, BSN [place]  [employer] manager

## 2022-11-26 NOTE — Addendum Note (Signed)
Addended byRochel Brome on: 11/26/2022 08:39 PM   Modules accepted: Orders

## 2022-11-26 NOTE — Patient Instructions (Signed)
Visit Information  Virginia Barron was given information about Medicaid Managed Care team care coordination services as a part of their [facility] Medicaid benefit. Virginia Barron verbally consented to engagement with the [facility] Managed Care team.   If you are experiencing a medical emergency, please call 911 or report to your local emergency department or urgent care.   If you have a non-emergency medical problem during routine business hours, please contact your provider's office and ask to speak with a nurse.   For questions related to your Healthy [facility] health plan, please call: [phone] or visit the homepage here: [email or web address]  If you would like to schedule transportation through your [facility] plan, please call the following number at least 2 days in advance of your appointment: [phone]  For information about your ride after you set it up, call Ride Assist at [phone]. Use this number to activate a Will Call pickup, or if your transportation is late for a scheduled pickup. Use this number, too, if you need to make a change or cancel a previously scheduled reservation.  If you need transportation services right away, call [phone]. The after-hours call center is staffed 24 hours to handle ride assistance and urgent reservation requests (including discharges) 365 days a year. Urgent trips include sick visits, hospital discharge requests and life-sustaining treatment.  Call the Virginia Barron at [phone], at any time, 24 hours a day, 7 days a week. If you are in danger or need immediate medical attention call 911.  If you would like help to quit smoking, call 1-800-QUIT-NOW 254-839-8600) OR Espaol: 1-855-Djelo-Ya (0-240-973-5329) o para ms informacin haga clic aqu or Text READY to 200-400 to register via text  Virginia Barron,   Please see education materials related to CHF and DM  provided by MyChart link.  Patient verbalizes understanding of instructions and care plan provided today and agrees to view in [place]. Active MyChart status and patient understanding of how to access instructions and care plan via MyChart confirmed with patient.     Telephone follow up appointment with Managed Medicaid care management team member scheduled for:12/12/22 @ [place] RN, BSN [place] RN Care Coordinator   Following is a copy of your plan of care:  Care Plan : RN Care Manager Plan of Care  Updates made by Virginia Montane, RN since 11/26/2022 12:00 AM     Problem: Health Management needs related to CHF and DMII      Long-Range Goal: Development of Plan of Care to address Health Management needs related to CHF and DMII   Start Date: 11/26/2022  Expected End Date: 03/26/2023  Note:   Current Barriers:  Chronic Disease Management support and education needs related to CHF and DMII  Patient has not been taking any prescribed medications for the last year for CHF and DMII, due to financial difficulty-limited insurance. She did not follow up with Cardiology and PCP. She reports increased SOB with walking over the last year. She is unable to walk to the grocery store(1/2 mile) like she was previously able to do. She has not been wearing her CPAP, due to contamination, a new CPAP was requested by PCP.   RNCM Clinical Goal(s):  Patient will verbalize understanding of plan for management of CHF and DMII as evidenced by patient reports take all medications exactly as prescribed and will call provider for medication related questions as evidenced by patient reports and EMR documentation  attend all scheduled medical appointments: 11/27/22 with Cardiology and 12/05/22 with PCP as evidenced by provider documentation        continue to work with Virginia Barron and/or Social Worker to address care management and care coordination needs related to CHF  and DMII as evidenced by adherence to CM Team Scheduled appointments       Interventions: Inter-disciplinary care team collaboration (see longitudinal plan of care) Evaluation of current treatment plan related to  self management and patient's adherence to plan as established by provider Collaborate with BSW for resources for pest control and non medical transportation Advised patient to contact [employer] [phone] for Member Benefits   Heart Failure Interventions:  (Status: New goal.)  Long Term Goal  Wt Readings from Last 3 Encounters:  11/18/22 292 lb (132.5 kg)  09/03/21 294 lb (133.4 kg)  08/28/21 295 lb 6.4 oz (134 kg)   Basic overview and discussion of pathophysiology of Heart Failure reviewed Reviewed Heart Failure Action Plan in depth and provided written copy Reviewed role of diuretics in prevention of fluid overload and management of heart failure Discussed the importance of keeping all appointments with provider Provided patient with education about the role of exercise in the management of heart failure Assessed social determinant of health barriers Advised patient to request 90 day refills for any appropriate medications Collaborate with PCP regarding CPAP Advised patient to express any concerns, questions or changes in her health with provider  Diabetes:  (Status: New goal.) Long Term Goal   Lab Results  Component Value Date   HGBA1C 6.6 (H) 11/18/2022   @ Assessed patient's understanding of A1c goal: <7% Provided education to patient about basic DM disease process; Reviewed prescribed diet with patient diabetic diet; Counseled on importance of regular laboratory monitoring as prescribed;        Discussed plans with patient for ongoing care management follow up and provided patient with direct contact information for care management team;      Reviewed scheduled/upcoming provider appointments including: Cardiology on 11/27/22 and PCP on  12/05/22;         Review of patient status, including review of consultants reports, relevant laboratory and other test results, and medications completed;       Assessed social determinant of health barriers;         Patient Goals/Self-Care Activities: Take medications as prescribed   Attend all scheduled provider appointments Call provider office for new concerns or questions  Work with the social worker to address care coordination needs and will continue to work with the clinical team to address health care and disease management related needs use salt in moderation watch for swelling in feet, ankles and legs every day eat more whole grains, fruits and vegetables, lean meats and healthy fats dress right for the weather, hot or cold keep appointment with eye doctor check feet daily for cuts, sores or redness drink 6 to 8 glasses of water each day fill half of plate with vegetables switch to sugar-free drinks

## 2022-11-27 ENCOUNTER — Encounter: Payer: Self-pay | Admitting: Cardiology

## 2022-11-27 ENCOUNTER — Ambulatory Visit: Payer: Medicaid Other | Attending: Cardiology | Admitting: Cardiology

## 2022-11-27 VITALS — BP 178/116 | HR 71 | Ht 65.0 in | Wt 292.0 lb

## 2022-11-27 DIAGNOSIS — Z6841 Body Mass Index (BMI) 40.0 and over, adult: Secondary | ICD-10-CM

## 2022-11-27 DIAGNOSIS — G4733 Obstructive sleep apnea (adult) (pediatric): Secondary | ICD-10-CM | POA: Diagnosis not present

## 2022-11-27 DIAGNOSIS — I10 Essential (primary) hypertension: Secondary | ICD-10-CM

## 2022-11-27 MED ORDER — AMLODIPINE BESYLATE 10 MG PO TABS: 5.0000 mg | ORAL_TABLET | Freq: Every day | ORAL | 3 refills | Status: DC

## 2022-11-27 NOTE — Progress Notes (Signed)
Cardiology Office Note:    Date:  79/48/0165   ID:  Virginia Barron, DOB [birth year], MRN [id number]  PCP:  [name], MD  Cardiologist:  Jenean Lindau, MD   Referring MD: [name], MD    ASSESSMENT:    1. Essential hypertension   2. OSA (obstructive sleep apnea)   3. Morbid obesity with body mass index (BMI) of 45.0 to 49.9 in adult [facility])    PLAN:    In order of problems listed above:  Primary prevention stressed with the patient.  Importance of compliance with diet medication stressed and she vocalized understanding. Essential hypertension: Blood pressure is elevated.  She is not compliant with diet and medication advised.  I counseled her about this.  Risks of uncontrolled hypertension explained.  When I checked her blood pressure it was 160/100.  I started her on amlodipine 10 mg.  Half tablet daily.  She will keep a track of her pulse and blood pressure and bring it to [place] in a week.  Diet emphasized.  Salt intake issues were discussed.  Ambulation encouraged. Obesity: Weight reduction stressed risks of obesity explained and she promises to do better.  Risks of obesity explained. Sleep apnea: Sleep health issues were discussed. Patient will be seen in follow-up appointment in 6 months or earlier if the patient has any concerns    Medication Adjustments/Labs and Tests Ordered: Current medicines are reviewed at length with the patient today.  Concerns regarding medicines are outlined above.  Orders Placed This Encounter  Procedures   EKG 12-Lead   Meds ordered this encounter  Medications   amLODipine (NORVASC) 10 MG tablet    Sig: Take 0.5 tablets (5 mg total) by mouth daily.    Dispense:  90 tablet    Refill:  3    Continue with 90 tablets as we may increase in 1 week if BP is not improved.     No chief complaint on file.    History of Present Illness:    Virginia Barron is a 61 y.o. female.  Patient has past medical history of essential hypertension,  morbid obesity and sleep apnea.  I have not seen her previously.  She used to see my partner who has moved to [place].  Patient has a calcium score of 0.  I reviewed these reports and also lipids done in the past.  She denies any problems at this time and takes care of activities of daily living.  She has a history of essential hypertension.  She is not taking any medication at this time.  She is not careful with her diet either.  She has history of sleep apnea.  Past Medical History:  Diagnosis Date   Chronic diastolic heart failure ([place]) 11/18/2022   Dyslipidemia associated with type 2 diabetes mellitus ([place]) 09/09/2021   Essential hypertension 08/15/2020   Fibroids    Morbid obesity with body mass index (BMI) of 45.0 to 49.9 in adult [facility]) 07/30/2020   Morbid obesity with body mass index (BMI) of 50.0 to 59.9 in adult [facility]) 07/30/2020   OSA (obstructive sleep apnea) 07/30/2020   Primary osteoarthritis of left knee 11/18/2022   Urge incontinence 07/30/2020    Past Surgical History:  Procedure Laterality Date   ABDOMINAL HYSTERECTOMY     precancerous cervical cells and fibroids.   conescopy     abnormal pap   LASIK     TUBAL LIGATION      Current Medications: Current Meds  Medication Sig  amLODipine (NORVASC) 10 MG tablet Take 0.5 tablets (5 mg total) by mouth daily.   cholecalciferol (VITAMIN D3) 25 MCG (1000 UNIT) tablet Take 400 Units by mouth daily.    Multiple Vitamin (MULTIVITAMIN) tablet Take 1 tablet by mouth daily.   Current Facility-Administered Medications for the 11/27/22 encounter (Office Visit) with Chantalle Defilippo, Reita Cliche, MD  Medication   triamcinolone acetonide (KENALOG-40) injection 80 mg     Allergies:   Avocado, Iodine, Kiwi extract, Penicillins, Pork-derived products, Shellfish allergy, and Latex   Social History   Socioeconomic History   Marital status: Divorced    Spouse name: Not on file   Number of children: Not on file   Years of education: Not on  file   Highest education level: Not on file  Occupational History   Not on file  Tobacco Use   Smoking status: Former    Types: Cigarettes    Quit date: 1980    Years since quitting: 43.9   Smokeless tobacco: Never  Substance and Sexual Activity   Alcohol use: Yes    Alcohol/week: 1.0 standard drink of alcohol    Types: 1 Standard drinks or equivalent per week    Comment: per month   Drug use: Never   Sexual activity: Not on file  Other Topics Concern   Not on file  Social History Narrative   Not on file   Social Determinants of Health   Financial Resource Strain: Medium Risk (11/20/2022)   Overall Financial Resource Strain (CARDIA)    Difficulty of Paying Living Expenses: Somewhat hard  Food Insecurity: Food Insecurity Present (11/20/2022)   Hunger Vital Sign    Worried About Running Out of Food in the Last Year: Sometimes true    Ran Out of Food in the Last Year: Sometimes true  Transportation Needs: No Transportation Needs (11/20/2022)   PRAPARE - Hydrologist (Medical): No    Lack of Transportation (Non-Medical): No  Physical Activity: Inactive (11/24/2022)   Exercise Vital Sign    Days of Exercise per Week: 0 days    Minutes of Exercise per Session: 0 min  Stress: Stress Concern Present (11/21/2022)   [place]    Feeling of Stress : Rather much  Social Connections: Not on file     Family History: The patient's family history includes Diabetes in her father, mother, and sister; Heart attack in her father.  ROS:   Please see the history of present illness.    All other systems reviewed and are negative.  EKGs/Labs/Other Studies Reviewed:    The following studies were reviewed today: EKG reveals sinus rhythm.  T wave inversions in lateral leads.   Recent Labs: 11/18/2022: ALT 16; BUN 15; Creatinine, Ser 1.02; Hemoglobin 13.6; Platelets 374; Potassium 3.9; Sodium  144; TSH 1.880  Recent Lipid Panel    Component Value Date/Time   CHOL 153 08/28/2021 0907   TRIG 88 08/28/2021 0907   HDL 37 (L) 08/28/2021 0907   CHOLHDL 4.1 08/28/2021 0907   LDLCALC 99 08/28/2021 0907    Physical Exam:    VS:  BP (!) 178/116   Pulse 71   Ht '5\' 5"'$  (1.651 m)   Wt 292 lb (132.5 kg)   SpO2 95%   BMI 48.59 kg/m     Wt Readings from Last 3 Encounters:  11/27/22 292 lb (132.5 kg)  11/18/22 292 lb (132.5 kg)  09/03/21 294 lb (133.4 kg)  GEN: Patient is in no acute distress HEENT: Normal NECK: No JVD; No carotid bruits LYMPHATICS: No lymphadenopathy CARDIAC: Hear sounds regular, 2/6 systolic murmur at the apex. RESPIRATORY:  Clear to auscultation without rales, wheezing or rhonchi  ABDOMEN: Soft, non-tender, non-distended MUSCULOSKELETAL:  No edema; No deformity  SKIN: Warm and dry NEUROLOGIC:  Alert and oriented x 3 PSYCHIATRIC:  Normal affect   Signed, Jenean Lindau, MD  11/27/2022 9:07 AM    [place]

## 2022-11-27 NOTE — Patient Instructions (Signed)
Medication Instructions:  Your physician has recommended you make the following change in your medication:   Start Amlodipine 10 mg take 1/2 tablet daily. Keep BP log and send in via MyChart in 1 week.  *If you need a refill on your cardiac medications before your next appointment, please call your pharmacy*   Lab Work: None ordered If you have labs (blood work) drawn today and your tests are completely normal, you will receive your results only by: [place] (if you have MyChart) OR A paper copy in the mail If you have any lab test that is abnormal or we need to change your treatment, we will call you to review the results.   Testing/Procedures: None ordered   Follow-Up: At [facility], you and your health needs are our priority.  As part of our continuing mission to provide you with exceptional heart care, we have created designated Provider Care Teams.  These Care Teams include your primary Cardiologist (physician) and Advanced Practice Providers (APPs -  Physician Assistants and Nurse Practitioners) who all work together to provide you with the care you need, when you need it.  We recommend signing up for the patient portal called "MyChart".  Sign up information is provided on this After Visit Summary.  MyChart is used to connect with patients for Virtual Visits (Telemedicine).  Patients are able to view lab/test results, encounter notes, upcoming appointments, etc.  Non-urgent messages can be sent to your provider as well.   To learn more about what you can do with MyChart, go to [email or web address].    Your next appointment:   12 month(s)  The format for your next appointment:   In Person  Provider:   Jyl Heinz, MD    Other Instructions none  Important Information About Sugar

## 2022-11-28 ENCOUNTER — Ambulatory Visit: Payer: Medicaid Other | Admitting: Licensed Clinical Social Worker

## 2022-12-04 ENCOUNTER — Other Ambulatory Visit: Payer: Medicaid Other | Admitting: Licensed Clinical Social Worker

## 2022-12-04 NOTE — Progress Notes (Unsigned)
Subjective:  Patient ID: Virginia Barron, female    DOB: 1961-05-27  Age: 61 y.o. MRN: [id number]  No chief complaint on file.   HPI   Current Outpatient Medications on File Prior to Visit  Medication Sig Dispense Refill   amLODipine (NORVASC) 10 MG tablet Take 0.5 tablets (5 mg total) by mouth daily. 90 tablet 3   cholecalciferol (VITAMIN D3) 25 MCG (1000 UNIT) tablet Take 400 Units by mouth daily.      Multiple Vitamin (MULTIVITAMIN) tablet Take 1 tablet by mouth daily.     Current Facility-Administered Medications on File Prior to Visit  Medication Dose Route Frequency Provider Last Rate Last Admin   triamcinolone acetonide (KENALOG-40) injection 80 mg  80 mg Intra-articular Once [name], MD       Past Medical History:  Diagnosis Date   Chronic diastolic heart failure ([place]) 11/18/2022   Dyslipidemia associated with type 2 diabetes mellitus ([place]) 09/09/2021   Essential hypertension 08/15/2020   Fibroids    Morbid obesity with body mass index (BMI) of 45.0 to 49.9 in adult [facility]) 07/30/2020   Morbid obesity with body mass index (BMI) of 50.0 to 59.9 in adult ([place]) 07/30/2020   OSA (obstructive sleep apnea) 07/30/2020   Primary osteoarthritis of left knee 11/18/2022   Urge incontinence 07/30/2020   Past Surgical History:  Procedure Laterality Date   ABDOMINAL HYSTERECTOMY     precancerous cervical cells and fibroids.   conescopy     abnormal pap   LASIK     TUBAL LIGATION      Family History  Problem Relation Age of Onset   Diabetes Mother    Diabetes Father    Heart attack Father    Diabetes Sister    Social History   Socioeconomic History   Marital status: Divorced    Spouse name: Not on file   Number of children: Not on file   Years of education: Not on file   Highest education level: Not on file  Occupational History   Not on file  Tobacco Use   Smoking status: Former    Types: Cigarettes    Quit date: 1980    Years since quitting: 44.0    Smokeless tobacco: Never  Substance and Sexual Activity   Alcohol use: Yes    Alcohol/week: 1.0 standard drink of alcohol    Types: 1 Standard drinks or equivalent per week    Comment: per month   Drug use: Never   Sexual activity: Not on file  Other Topics Concern   Not on file  Social History Narrative   Not on file   Social Determinants of Health   Financial Resource Strain: Medium Risk (11/20/2022)   Overall Financial Resource Strain (CARDIA)    Difficulty of Paying Living Expenses: Somewhat hard  Food Insecurity: Food Insecurity Present (11/20/2022)   Hunger Vital Sign    Worried About Running Out of Food in the Last Year: Sometimes true    Ran Out of Food in the Last Year: Sometimes true  Transportation Needs: No Transportation Needs (11/20/2022)   PRAPARE - Hydrologist (Medical): No    Lack of Transportation (Non-Medical): No  Physical Activity: Inactive (11/24/2022)   Exercise Vital Sign    Days of Exercise per Week: 0 days    Minutes of Exercise per Session: 0 min  Stress: Stress Concern Present (12/04/2022)   [place]    Feeling  of Stress : To some extent  Social Connections: Not on file    Review of Systems   Objective:  There were no vitals taken for this visit.     11/27/2022    8:28 AM 11/18/2022    3:38 PM 09/03/2021   11:53 AM  BP/Weight  Systolic BP 161 096 045  Diastolic BP 409 76 74  Wt. (Lbs) 292 292 294  BMI 48.59 kg/m2 48.59 kg/m2 48.92 kg/m2    Physical Exam  Diabetic Foot Exam - Simple   No data filed      Lab Results  Component Value Date   WBC 8.5 11/18/2022   HGB 13.6 11/18/2022   HCT 44.4 11/18/2022   PLT 374 11/18/2022   GLUCOSE 105 (H) 11/18/2022   CHOL 153 08/28/2021   TRIG 88 08/28/2021   HDL 37 (L) 08/28/2021   LDLCALC 99 08/28/2021   ALT 16 11/18/2022   AST 14 11/18/2022   NA 144 11/18/2022   K 3.9 11/18/2022   CL  106 11/18/2022   CREATININE 1.02 (H) 11/18/2022   BUN 15 11/18/2022   CO2 19 (L) 11/18/2022   TSH 1.880 11/18/2022   HGBA1C 6.6 (H) 11/18/2022   MICROALBUR 10 09/03/2021      Assessment & Plan:   Problem List Items Addressed This Visit   None .  No orders of the defined types were placed in this encounter.   No orders of the defined types were placed in this encounter.    Follow-up: No follow-ups on file.  An After Visit Summary was printed and given to the patient.  [name], [place] [phone]

## 2022-12-04 NOTE — Patient Instructions (Signed)
Visit Information  Virginia Barron was given information about Medicaid Managed Care team care coordination services as a part of their Healthy [facility] Medicaid benefit. Virginia Barron verbally consented to engagement with the [facility] Managed Care team.   If you are experiencing a medical emergency, please call 911 or report to your local emergency department or urgent care.   If you have a non-emergency medical problem during routine business hours, please contact your provider's office and ask to speak with a nurse.   For questions related to your Healthy [facility] health plan, please call: [phone] or visit the homepage here: [email or web address]  If you would like to schedule transportation through your Healthy [facility] plan, please call the following number at least 2 days in advance of your appointment: [phone]  For information about your ride after you set it up, call Ride Assist at [phone]. Use this number to activate a Will Call pickup, or if your transportation is late for a scheduled pickup. Use this number, too, if you need to make a change or cancel a previously scheduled reservation.  If you need transportation services right away, call [phone]. The after-hours call center is staffed 24 hours to handle ride assistance and urgent reservation requests (including discharges) 365 days a year. Urgent trips include sick visits, hospital discharge requests and life-sustaining treatment.  Call the [place] at [phone], at any time, 24 hours a day, 7 days a week. If you are in danger or need immediate medical attention call 911.  If you would like help to quit smoking, call 1-800-QUIT-NOW 248-594-0734) OR Espaol: 1-855-Djelo-Ya (5-188-416-6063) o para ms informacin haga clic aqu or Text READY to 200-400 to register via text   Following is a copy of your plan of care:  Care Plan : LCSW Plan of Care   Updates made by Greg Cutter, LCSW since 12/04/2022 12:00 AM     Problem: Depression Identification (Depression)      Long-Range Goal: Depressive Symptoms Identified   Start Date: 11/21/2022  Note:   Priority: High  Timeframe:  Long-Range Goal Priority:  High Start Date:   11/21/22            Expected End Date:  ongoing                     Follow Up Date--12/16/21 at 1030 am  - keep 90 percent of scheduled appointments -consider counseling or psychiatry -consider bumping up your self-care  -consider creating a stronger support network   Why is this important?             Combatting depression may take some time.            If you don't feel better right away, don't give up on your treatment plan.    Current barriers:   Chronic Mental Health needs related to depression and stress. Patient requires Support, Education, Resources, Referrals, Advocacy, and Care Coordination, in order to meet Unmet Mental Health Needs. Limited support network Patient will implement clinical interventions discussed today to decrease symptoms of depression and increase knowledge and/or ability of: coping skills. Mental Health Concerns and Social Isolation Patient lacks knowledge of available community counseling agencies and resources.  Clinical Goal(s): verbalize understanding of plan for management of Depression, and Stress and demonstrate a reduction in symptoms. Patient will connect with a provider for ongoing mental health treatment, increase coping skills, healthy habits, self-management skills, and stress reduction  Patient Goals/Self-Care Activities: Over the next 120 days Attend scheduled medical appointments Utilize healthy coping skills and supportive resources discussed Contact PCP with any questions or concerns Keep 90 percent of counseling appointments Call your insurance provider for more information about your Enhanced Benefits  Check out counseling resources provided   Begin personal counseling with LCSW, to reduce and manage symptoms of Depression and Stress, until well-established with mental health provider Accept all calls from representative with [facility] [place] in an effort to establish ongoing mental health counseling and supportive services. Incorporate into daily practice - relaxation techniques, deep breathing exercises, and mindfulness meditation strategies. Talk about feelings with friends, family members, spiritual advisor, etc. Contact LCSW directly [phone]), if you have questions, need assistance, or if additional social work needs are identified between now and our next scheduled telephone outreach call. Call 988 for mental health hotline/crisis line if needed (24/7 available) Try techniques to reduce symptoms of anxiety/negative thinking (deep breathing, distraction, positive self talk, etc)  - develop a personal safety plan - develop a plan to deal with triggers like holidays, anniversaries - exercise at least 2 to 3 times per week - have a plan for how to handle bad days - journal feelings and what helps to feel better or worse - spend time or talk with others at least 2 to 3 times per week - watch for early signs of feeling worse - begin personal counseling - call and visit an old friend - check out volunteer opportunities - join a support group - laugh; watch a funny movie or comedian - learn and use visualization or guided imagery - perform a random act of kindness - practice relaxation or meditation daily - start or continue a personal journal - practice positive thinking and self-talk -continue with compliance of taking medication  -identify current effective and ineffective coping strategies.  -implement positive self-talk in care to increase self-esteem, confidence and feelings of control.  -consider alternative and complementary therapy approaches such as meditation, mindfulness or yoga.  -journaling,  prayer, worship services, meditation or pastoral counseling.  -increase participation in pleasurable group activities such as hobbies, singing, sports or volunteering).  -consider the use of meditative movement therapy such as tai chi, yoga or qigong.  -start a regular daily exercise program based on tolerance, ability and patient choice to support positive thinking and activity       11/21/2022   10:07 AM 11/18/2022    3:42 PM 04/08/2021    7:58 PM 10/23/2020   12:08 PM  Depression screen PHQ 2/9  Decreased Interest 1 1 0 0  Down, Depressed, Hopeless 1 0 0 2  PHQ - 2 Score 2 1 0 2  Altered sleeping 2 2  0  Tired, decreased energy _0 Change in appetite 0 0  2  Feeling bad or failure about yourself  0 0  0  Trouble concentrating 1 1  0  Moving slowly or fidgety/restless 0 0    Suicidal thoughts 0 0  1  PHQ-9 Score _1 Difficult doing work/chores Somewhat difficult Somewhat difficult  Somewhat difficult   10 LITTLE Things To Do When You're Feeling Too Down To Do Anything  Take a shower. Even if you plan to stay in all day long and not see a soul, take a shower. It takes the most effort to hop in to the shower but once you do, you'll feel immediate results. It will wake you up  and you'll be feeling much fresher (and cleaner too).  Brush and floss your teeth. Give your teeth a good brushing with a floss finish. It's a small task but it feels so good and you can check 'taking care of your health' off the list of things to do.  Do something small on your list. Most of [place] have some small thing we would like to get done (load of laundry, sew a button, email a friend). Doing one of these things will make you feel like you've accomplished something.  Drink water. Drinking water is easy right? It's also really beneficial for your health so keep a glass beside you all day and take sips often. It gives you energy and prevents you from boredom eating.  Do some floor exercises. The  last thing you want to do is exercise but it might be just the thing you need the most. Keep it simple and do exercises that involve sitting or laying on the floor. Even the smallest of exercises release chemicals in the brain that make you feel good. Yoga stretches or core exercises are going to make you feel good with minimal effort.  Make your bed. Making your bed takes a few minutes but it's productive and you'll feel relieved when it's done. An unmade bed is a huge visual reminder that you're having an unproductive day. Do it and consider it your housework for the day.  Put on some nice clothes. Take the sweatpants off even if you don't plan to go anywhere. Put on clothes that make you feel good. Take a look in the mirror so your brain recognizes the sweatpants have been replaced with clothes that make you look great. It's an instant confidence booster.  Wash the dishes. A pile of dirty dishes in the sink is a reflection of your mood. It's possible that if you wash up the dishes, your mood will follow suit. It's worth a try.  Cook a real meal. If you have the luxury to have a "do nothing" day, you have time to make a real meal for yourself. Make a meal that you love to eat. The process is good to get you out of the funk and the food will ensure you have more energy for tomorrow.  Write out your thoughts by hand. When you hand write, you stimulate your brain to focus on the moment that you're in so make yourself comfortable and write whatever comes into your mind. Put those thoughts out on paper so they stop spinning around in your head. Those thoughts might be the very thing holding you down.  If you are experiencing a Mental Health or [place] or need someone to talk to, please call the Suicide and Crisis Lifeline: 988    Patient Goals: Follow up goal

## 2022-12-04 NOTE — Patient Outreach (Signed)
Medicaid Managed Care Social Work Note  84/69/6295 Name:  Virginia Barron MRN:  [id number] DOB:  [birth year]  Virginia Barron is an 61 y.o. year old female who is a primary patient of [name], Kirsten, MD.  The Medicaid Managed Care Coordination team was consulted for assistance with:  [place] and Resources  Ms. Hilton was given information about Medicaid Managed Care Coordination team services today. Sway Vollmer Patient agreed to services and verbal consent obtained.  Engaged with patient  for by telephone forfollow up visit in response to referral for case management and/or care coordination services.   Assessments/Interventions:  Review of past medical history, allergies, medications, health status, including review of consultants reports, laboratory and other test data, was performed as part of comprehensive evaluation and provision of chronic care management services.  SDOH: (Social Determinant of Health) assessments and interventions performed: SDOH Interventions    Flowsheet Row Patient Outreach Telephone from 12/04/2022 in [place] Patient Outreach Telephone from 11/26/2022 in [place] Patient Outreach Telephone from 11/21/2022 in [place]  SDOH Interventions     Alcohol Usage Interventions -- Intervention Not Indicated (Score <7) --  Depression Interventions/Treatment  -- -- Referral to Psychiatry  Stress Interventions Offered [employer], Provide Counseling -- Provide Counseling, Offered [employer] Resources       Advanced Directives Status:  See Care Plan for related entries.  Care Plan                 Allergies  Allergen Reactions   Avocado    Iodine Hives, Photosensitivity and Swelling   Kiwi Extract    Penicillins    Pork-Derived Products    Shellfish Allergy    Latex Hives, Itching, Rash and Swelling    Medications Reviewed  Today     Reviewed by Lowella Grip, CMA (Certified Medical Assistant) on 11/27/22 at [phone]  Med List Status: <None>   Medication Order Taking? Sig Documenting Provider Last Dose Status Informant  cholecalciferol (VITAMIN D3) 25 MCG (1000 UNIT) tablet [id number]  Take 400 Units by mouth daily.  [provider]  Active   Multiple Vitamin (MULTIVITAMIN) tablet [id number]  Take 1 tablet by mouth daily. [provider]  Active   triamcinolone acetonide (KENALOG-40) injection 80 mg [id number]   [name], MD  Active             Patient Active Problem List   Diagnosis Date Noted   Morbid obesity with body mass index (BMI) of 50.0 to 59.9 in adult [facility]) 11/26/2022   Primary osteoarthritis of left knee 11/18/2022   Chronic diastolic heart failure ([place]) 11/18/2022   Dyslipidemia associated with type 2 diabetes mellitus ([place]) 09/09/2021   Essential hypertension 08/15/2020   Fibroids    Urge incontinence 07/30/2020   OSA (obstructive sleep apnea) 07/30/2020   Morbid obesity with body mass index (BMI) of 45.0 to 49.9 in adult [facility]) 07/30/2020    Conditions to be addressed/monitored per PCP order:  Depression  Care Plan : LCSW Plan of Care  Updates made by Greg Cutter, LCSW since 12/04/2022 12:00 AM     Problem: Depression Identification (Depression)      Long-Range Goal: Depressive Symptoms Identified   Start Date: 11/21/2022  Note:   Priority: High  Timeframe:  Long-Range Goal Priority:  High Start Date:   11/21/22            Expected End Date:  ongoing  Follow Up Date--12/16/22 at 1030 am  - keep 90 percent of scheduled appointments -consider counseling or psychiatry -consider bumping up your self-care  -consider creating a stronger support network   Why is this important?             Combatting depression may take some time.            If you don't feel better right away, don't give up on your treatment plan.    Current barriers:    Chronic Mental Health needs related to depression and stress. Patient requires Support, Education, Resources, Referrals, Advocacy, and Care Coordination, in order to meet Unmet Mental Health Needs. Limited support network Patient will implement clinical interventions discussed today to decrease symptoms of depression and increase knowledge and/or ability of: coping skills. Mental Health Concerns and Social Isolation Patient lacks knowledge of available community counseling agencies and resources.  Clinical Goal(s): verbalize understanding of plan for management of Depression, and Stress and demonstrate a reduction in symptoms. Patient will connect with a provider for ongoing mental health treatment, increase coping skills, healthy habits, self-management skills, and stress reduction        Clinical Interventions:  Assessed patient's previous and current treatment, coping skills, support system and barriers to care. Patient provided hx  Verbalization of feelings encouraged, motivational interviewing employed Emotional support provided, positive coping strategies explored. Establishing healthy boundaries emphasized and healthy self-care education provided Patient was educated on available mental health resources within their area that accept Medicaid and offer counseling and psychiatry. Patient reports significant worsening depression and stress impacting her ability to function appropriately and carry out daily task. Patient reports never having mental health support. 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 She reports that she has a close friend [name] that lives in [place] that she talks to on the phone to gain socialization.  Patient is agreeable to [facility] to [facility]. Patient prefers virtual counseling. [facility] LCSW made referral on 11/21/22. UPDATE- Patient reports that she was able to establish contact with agency but she thinks her  initial appointment with them was only with a psychiatrist but she will look through her paperwork and double check. Patient reports interest in gaining counseling as well. Email will be sent to [place] if needed during next social work outreach.  LCSW provided education on relaxation techniques such as meditation, deep breathing, massage, grounding exercises or yoga that can activate the body's relaxation response and ease symptoms of stress and anxiety. LCSW ask that when pt is struggling with difficult emotions and racing thoughts that they start this relaxation response process. LCSW provided extensive education on healthy coping skills for anxiety. SW used active and reflective listening, validated patient's feelings/concerns, and provided emotional support. Patient will work on implementing appropriate self-care habits into their daily routine such as: staying positive, writing a gratitude list, drinking water, staying active around the house, taking their medications as prescribed, combating negative thoughts or emotions and staying connected with their family and friends. Positive reinforcement provided for this decision to work on this. Patient reports that they experience both anxiety and depression. He shares that he feels alone most days. He was receptive to anxiety and depression management coping skill education.  Motivational Interviewing employed Depression screen reviewed  PHQ2/ PHQ9 completed or reviewed  Mindfulness or Relaxation training provided Active listening / Reflection utilized  Advance Care and HCPOA education provided Emotional Support Provided Problem [place] strategies  reviewed Provided psychoeducation for mental health needs  Provided brief CBT  Reviewed mental health medications and discussed importance of compliance:  Quality of sleep assessed & Sleep Hygiene techniques promoted  Participation in counseling encouraged  Verbalization of feelings encouraged   Suicidal Ideation/Homicidal Ideation assessed: Patient denies SI/HI  Review resources, discussed options and provided patient information about  [place] care team collaboration (see longitudinal plan of care) Patient Goals/Self-Care Activities: Over the next 120 days Attend scheduled medical appointments Utilize healthy coping skills and supportive resources discussed Contact PCP with any questions or concerns Keep 90 percent of counseling appointments Call your insurance provider for more information about your Enhanced Benefits  Check out counseling resources provided  Begin personal counseling with LCSW, to reduce and manage symptoms of Depression and Stress, until well-established with mental health provider Accept all calls from representative with [facility] in an effort to establish ongoing mental health counseling and supportive services. Incorporate into daily practice - relaxation techniques, deep breathing exercises, and mindfulness meditation strategies. Talk about feelings with friends, family members, spiritual advisor, etc. Contact LCSW directly [phone]), if you have questions, need assistance, or if additional social work needs are identified between now and our next scheduled telephone outreach call. Call [phone] for mental health hotline/crisis line if needed (24/7 available) Try techniques to reduce symptoms of anxiety/negative thinking (deep breathing, distraction, positive self talk, etc)  - develop a personal safety plan - develop a plan to deal with triggers like holidays, anniversaries - exercise at least 2 to 3 times per week - have a plan for how to handle bad days - journal feelings and what helps to feel better or worse - spend time or talk with others at least 2 to 3 times per week - watch for early signs of feeling worse - begin personal counseling - call and visit an old friend - check out volunteer  opportunities - join a support group - laugh; watch a funny movie or comedian - learn and use visualization or guided imagery - perform a random act of kindness - practice relaxation or meditation daily - start or continue a personal journal - practice positive thinking and self-talk -continue with compliance of taking medication  -identify current effective and ineffective coping strategies.  -implement positive self-talk in care to increase self-esteem, confidence and feelings of control.  -consider alternative and complementary therapy approaches such as meditation, mindfulness or yoga.  -journaling, prayer, worship services, meditation or pastoral counseling.  -increase participation in pleasurable group activities such as hobbies, singing, sports or volunteering).  -consider the use of meditative movement therapy such as tai chi, yoga or qigong.  -start a regular daily exercise program based on tolerance, ability and patient choice to support positive thinking and activity       11/21/2022   10:07 AM 11/18/2022    3:42 PM 04/08/2021    7:58 PM 10/23/2020   12:08 PM  Depression screen PHQ 2/9  Decreased Interest 1 1 0 0  Down, Depressed, Hopeless 1 0 0 2  PHQ - 2 Score 2 1 0 2  Altered sleeping 2 2  0  Tired, decreased energy _0 Change in appetite 0 0  2  Feeling bad or failure about yourself  0 0  0  Trouble concentrating 1 1  0  Moving slowly or fidgety/restless 0 0    Suicidal thoughts 0 0  1  PHQ-9 Score 6 5  6  Difficult doing work/chores Somewhat difficult Somewhat difficult  Somewhat difficult   10 LITTLE Things To Do When You're Feeling Too Down To Do Anything  Take a shower. Even if you plan to stay in all day long and not see a soul, take a shower. It takes the most effort to hop in to the shower but once you do, you'll feel immediate results. It will wake you up and you'll be feeling much fresher (and cleaner too).  Brush and floss your teeth. Give your  teeth a good brushing with a floss finish. It's a small task but it feels so good and you can check 'taking care of your health' off the list of things to do.  Do something small on your list. Most of [place] have some small thing we would like to get done (load of laundry, sew a button, email a friend). Doing one of these things will make you feel like you've accomplished something.  Drink water. Drinking water is easy right? It's also really beneficial for your health so keep a glass beside you all day and take sips often. It gives you energy and prevents you from boredom eating.  Do some floor exercises. The last thing you want to do is exercise but it might be just the thing you need the most. Keep it simple and do exercises that involve sitting or laying on the floor. Even the smallest of exercises release chemicals in the brain that make you feel good. Yoga stretches or core exercises are going to make you feel good with minimal effort.  Make your bed. Making your bed takes a few minutes but it's productive and you'll feel relieved when it's done. An unmade bed is a huge visual reminder that you're having an unproductive day. Do it and consider it your housework for the day.  Put on some nice clothes. Take the sweatpants off even if you don't plan to go anywhere. Put on clothes that make you feel good. Take a look in the mirror so your brain recognizes the sweatpants have been replaced with clothes that make you look great. It's an instant confidence booster.  Wash the dishes. A pile of dirty dishes in the sink is a reflection of your mood. It's possible that if you wash up the dishes, your mood will follow suit. It's worth a try.  Cook a real meal. If you have the luxury to have a "do nothing" day, you have time to make a real meal for yourself. Make a meal that you love to eat. The process is good to get you out of the funk and the food will ensure you have more energy for tomorrow.  Write  out your thoughts by hand. When you hand write, you stimulate your brain to focus on the moment that you're in so make yourself comfortable and write whatever comes into your mind. Put those thoughts out on paper so they stop spinning around in your head. Those thoughts might be the very thing holding you down.  If you are experiencing a Mental Health or [place] or need someone to talk to, please call the Suicide and Crisis Lifeline: 988    Patient Goals: Follow up goal      Follow up:  Patient agrees to Care Plan and Follow-up.  Plan: The Managed Medicaid care management team will reach out to the patient again over the next 30 days.  Date/time of next scheduled Social Work care management/care coordination outreach:  12/16/22 at 1030 am  Eula Fried, BSW, MSW, [employer] Managed Medicaid LCSW [email or web address] Phone: [phone]

## 2022-12-05 ENCOUNTER — Ambulatory Visit: Payer: Medicaid Other | Admitting: Nurse Practitioner

## 2022-12-05 ENCOUNTER — Encounter: Payer: Self-pay | Admitting: Nurse Practitioner

## 2022-12-05 VITALS — BP 140/80 | HR 80 | Temp 97.2°F | Resp 16 | Ht 65.0 in | Wt 288.0 lb

## 2022-12-05 DIAGNOSIS — Z1231 Encounter for screening mammogram for malignant neoplasm of breast: Secondary | ICD-10-CM | POA: Diagnosis not present

## 2022-12-05 DIAGNOSIS — E119 Type 2 diabetes mellitus without complications: Secondary | ICD-10-CM | POA: Diagnosis not present

## 2022-12-05 DIAGNOSIS — Z6841 Body Mass Index (BMI) 40.0 and over, adult: Secondary | ICD-10-CM

## 2022-12-05 DIAGNOSIS — I10 Essential (primary) hypertension: Secondary | ICD-10-CM | POA: Diagnosis not present

## 2022-12-05 DIAGNOSIS — E1169 Type 2 diabetes mellitus with other specified complication: Secondary | ICD-10-CM | POA: Diagnosis not present

## 2022-12-05 DIAGNOSIS — G4733 Obstructive sleep apnea (adult) (pediatric): Secondary | ICD-10-CM

## 2022-12-05 DIAGNOSIS — E785 Hyperlipidemia, unspecified: Secondary | ICD-10-CM | POA: Diagnosis not present

## 2022-12-05 MED ORDER — METFORMIN HCL 500 MG PO TABS: 500.0000 mg | ORAL_TABLET | Freq: Every day | ORAL | 1 refills | Status: DC

## 2022-12-05 MED ORDER — METFORMIN HCL 500 MG PO TABS: 500.0000 mg | ORAL_TABLET | Freq: Every day | ORAL | 3 refills | Status: DC

## 2022-12-05 MED ORDER — LOSARTAN POTASSIUM-HCTZ 50-12.5 MG PO TABS: 1.0000 | ORAL_TABLET | Freq: Every day | ORAL | 0 refills | Status: DC

## 2022-12-05 NOTE — Addendum Note (Signed)
Addended byNeil Crouch on: 12/05/2022 02:53 PM   Modules accepted: Orders

## 2022-12-05 NOTE — Assessment & Plan Note (Addendum)
>>  ASSESSMENT AND PLAN FOR DYSLIPIDEMIA ASSOCIATED WITH TYPE 2 DIABETES MELLITUS (HCC) WRITTEN ON 12/05/2022 11:37 AM BY Lurline Del, FNP  Lipid profile is drawn today Nutrition: Stressed importance of moderation in sodium intake, saturated fat and cholesterol, caloric balance, sufficient intake of complex carbohydrates, fiber, calcium and iron.   Exercise: Stressed the importance of regular exercise.     >>ASSESSMENT AND PLAN FOR TYPE 2 DIABETES MELLITUS WITHOUT COMPLICATION, WITHOUT LONG-TERM CURRENT USE OF INSULIN (HCC) WRITTEN ON 12/05/2022 10:57 AM BY Lurline Del, FNP  Metformin 500 mg OD   Nutrition: Stressed importance of moderation in sodium intake, saturated fat and cholesterol, caloric balance, sufficient intake of complex carbohydrates, fiber, calcium and iron.   Exercise: Stressed the importance of regular exercise.

## 2022-12-05 NOTE — Assessment & Plan Note (Addendum)
BP not under controlled Patient is taking only amlodipine 5 mg OD, increased to 10 mg Added losartan-HYDROCHLOROTHIAZIDE 50-12.5 mg OD Clinic BP 138/90 and 140/80 Not taking HYDROCHLOROTHIAZIDE and COREG which was prescribed by cardiologist (Dr.Tobb) because of financial issues  Suggested to follow up with cardiology

## 2022-12-05 NOTE — Assessment & Plan Note (Signed)
Metformin 500 mg OD   Nutrition: Stressed importance of moderation in sodium intake, saturated fat and cholesterol, caloric balance, sufficient intake of complex carbohydrates, fiber, calcium and iron.   Exercise: Stressed the importance of regular exercise.

## 2022-12-05 NOTE — Assessment & Plan Note (Signed)
Refer to the mammogram mobile

## 2022-12-05 NOTE — Assessment & Plan Note (Signed)
Lipid profile will be checked today Nutrition: Stressed importance of moderation in sodium intake, saturated fat and cholesterol, caloric balance, sufficient intake of complex carbohydrates, fiber, calcium and iron.   Exercise: Stressed the importance of regular exercise.

## 2022-12-05 NOTE — Assessment & Plan Note (Signed)
Called from company CPAP

## 2022-12-05 NOTE — Patient Instructions (Signed)
Follow up in 2 weeks to recheck BP Start to take Losartan-HYDROCHLOROTHIAZIDE once daily Take metformin '500mg'$  once daily   Diabetes Mellitus and Nutrition, Adult When you have diabetes, or diabetes mellitus, it is very important to have healthy eating habits because your blood sugar (glucose) levels are greatly affected by what you eat and drink. Eating healthy foods in the right amounts, at about the same times every day, can help you: Manage your blood glucose. Lower your risk of heart disease. Improve your blood pressure. Reach or maintain a healthy weight. What can affect my meal plan? Every person with diabetes is different, and each person has different needs for a meal plan. Your health care provider may recommend that you work with a dietitian to make a meal plan that is best for you. Your meal plan may vary depending on factors such as: The calories you need. The medicines you take. Your weight. Your blood glucose, blood pressure, and cholesterol levels. Your activity level. Other health conditions you have, such as heart or kidney disease. How do carbohydrates affect me? Carbohydrates, also called carbs, affect your blood glucose level more than any other type of food. Eating carbs raises the amount of glucose in your blood. It is important to know how many carbs you can safely have in each meal. This is different for every person. Your dietitian can help you calculate how many carbs you should have at each meal and for each snack. How does alcohol affect me? Alcohol can cause a decrease in blood glucose (hypoglycemia), especially if you use insulin or take certain diabetes medicines by mouth. Hypoglycemia can be a life-threatening condition. Symptoms of hypoglycemia, such as sleepiness, dizziness, and confusion, are similar to symptoms of having too much alcohol. Do not drink alcohol if: Your health care provider tells you not to drink. You are pregnant, may be pregnant, or are  planning to become pregnant. If you drink alcohol: Limit how much you have to: 0-1 drink a day for women. 0-2 drinks a day for men. Know how much alcohol is in your drink. In the [place] one drink equals one 12 oz bottle of beer (355 mL), one 5 oz glass of wine (148 mL), or one 1 oz glass of hard liquor (44 mL). Keep yourself hydrated with water, diet soda, or unsweetened iced tea. Keep in mind that regular soda, juice, and other mixers may contain a lot of sugar and must be counted as carbs. What are tips for following this plan?  Reading food labels Start by checking the serving size on the Nutrition Facts label of packaged foods and drinks. The number of calories and the amount of carbs, fats, and other nutrients listed on the label are based on one serving of the item. Many items contain more than one serving per package. Check the total grams (g) of carbs in one serving. Check the number of grams of saturated fats and trans fats in one serving. Choose foods that have a low amount or none of these fats. Check the number of milligrams (mg) of salt (sodium) in one serving. Most people should limit total sodium intake to less than 2,300 mg per day. Always check the nutrition information of foods labeled as "low-fat" or "nonfat." These foods may be higher in added sugar or refined carbs and should be avoided. Talk to your dietitian to identify your daily goals for nutrients listed on the label. Shopping Avoid buying canned, pre-made, or processed foods. These foods tend to  be high in fat, sodium, and added sugar. Shop around the outside edge of the grocery store. This is where you will most often find fresh fruits and vegetables, bulk grains, fresh meats, and fresh dairy products. Cooking Use low-heat cooking methods, such as baking, instead of high-heat cooking methods, such as deep frying. Cook using healthy oils, such as olive, canola, or sunflower oil. Avoid cooking with butter, cream, or  high-fat meats. Meal planning Eat meals and snacks regularly, preferably at the same times every day. Avoid going long periods of time without eating. Eat foods that are high in fiber, such as fresh fruits, vegetables, beans, and whole grains. Eat 4-6 oz (112-168 g) of lean protein each day, such as lean meat, chicken, fish, eggs, or tofu. One ounce (oz) (28 g) of lean protein is equal to: 1 oz (28 g) of meat, chicken, or fish. 1 egg.  cup (62 g) of tofu. Eat some foods each day that contain healthy fats, such as avocado, nuts, seeds, and fish. What foods should I eat? Fruits Berries. Apples. Oranges. Peaches. Apricots. Plums. Grapes. Mangoes. Papayas. Pomegranates. Kiwi. Cherries. Vegetables Leafy greens, including lettuce, spinach, kale, chard, collard greens, mustard greens, and cabbage. Beets. Cauliflower. Broccoli. Carrots. Green beans. Tomatoes. Peppers. Onions. Cucumbers. Brussels sprouts. Grains Whole grains, such as whole-wheat or whole-grain bread, crackers, tortillas, cereal, and pasta. Unsweetened oatmeal. Quinoa. Brown or wild rice. Meats and other proteins Seafood. Poultry without skin. Lean cuts of poultry and beef. Tofu. Nuts. Seeds. Dairy Low-fat or fat-free dairy products such as milk, yogurt, and cheese. The items listed above may not be a complete list of foods and beverages you can eat and drink. Contact a dietitian for more information. What foods should I avoid? Fruits Fruits canned with syrup. Vegetables Canned vegetables. Frozen vegetables with butter or cream sauce. Grains Refined white flour and flour products such as bread, pasta, snack foods, and cereals. Avoid all processed foods. Meats and other proteins Fatty cuts of meat. Poultry with skin. Breaded or fried meats. Processed meat. Avoid saturated fats. Dairy Full-fat yogurt, cheese, or milk. Beverages Sweetened drinks, such as soda or iced tea. The items listed above may not be a complete list of  foods and beverages you should avoid. Contact a dietitian for more information. Questions to ask a health care provider Do I need to meet with a certified diabetes care and education specialist? Do I need to meet with a dietitian? What number can I call if I have questions? When are the best times to check my blood glucose? Where to find more information: American Diabetes Association: diabetes.org Academy of Nutrition and Dietetics: eatright.[employer] of Diabetes and Digestive and Kidney Diseases: [employer] Association of Diabetes Care & Education Specialists: diabeteseducator.org Summary It is important to have healthy eating habits because your blood sugar (glucose) levels are greatly affected by what you eat and drink. It is important to use alcohol carefully. A healthy meal plan will help you manage your blood glucose and lower your risk of heart disease. Your health care provider may recommend that you work with a dietitian to make a meal plan that is best for you. This information is not intended to replace advice given to you by your health care provider. Make sure you discuss any questions you have with your health care provider. Document Revised: 06/28/2020 Document Reviewed: 06/28/2020 Elsevier Patient Education  [place].

## 2022-12-06 DIAGNOSIS — G4733 Obstructive sleep apnea (adult) (pediatric): Secondary | ICD-10-CM | POA: Diagnosis not present

## 2022-12-06 LAB — LIPID PANEL
Chol/HDL Ratio: 3.2 ratio (ref 0.0–4.4)
Cholesterol, Total: 143 mg/dL (ref 100–199)
HDL: 45 mg/dL (ref >39)
LDL Chol Calc (NIH): 87 mg/dL (ref 0–99)
Triglycerides: 53 mg/dL (ref 0–149)
VLDL Cholesterol Cal: 11 mg/dL (ref 5–40)

## 2022-12-06 LAB — CARDIOVASCULAR RISK ASSESSMENT

## 2022-12-10 LAB — HM DIABETES EYE EXAM

## 2022-12-12 ENCOUNTER — Other Ambulatory Visit: Payer: Medicaid Other | Admitting: *Deleted

## 2022-12-12 ENCOUNTER — Encounter: Payer: Self-pay | Admitting: *Deleted

## 2022-12-12 NOTE — Patient Instructions (Signed)
Visit Information  Virginia Barron was given information about Medicaid Managed Care team care coordination services as a part of their Healthy [facility] Medicaid benefit. Virginia Barron verbally consented to engagement with the [facility] Managed Care team.   If you are experiencing a medical emergency, please call 911 or report to your local emergency department or urgent care.   If you have a non-emergency medical problem during routine business hours, please contact your provider's office and ask to speak with a nurse.   For questions related to your Healthy [facility] health plan, please call: [phone] or visit the homepage here: [email or web address]  If you would like to schedule transportation through your Healthy [facility] plan, please call the following number at least 2 days in advance of your appointment: [phone]  For information about your ride after you set it up, call Ride Assist at [phone]. Use this number to activate a Will Call pickup, or if your transportation is late for a scheduled pickup. Use this number, too, if you need to make a change or cancel a previously scheduled reservation.  If you need transportation services right away, call [phone]. The after-hours call center is staffed 24 hours to handle ride assistance and urgent reservation requests (including discharges) 365 days a year. Urgent trips include sick visits, hospital discharge requests and life-sustaining treatment.  Call the [place] at [phone], at any time, 24 hours a day, 7 days a week. If you are in danger or need immediate medical attention call 911.  If you would like help to quit smoking, call 1-800-QUIT-NOW (702)805-3181) OR Espaol: 1-855-Djelo-Ya (6-945-038-8828) o para ms informacin haga clic aqu or Text READY to 200-400 to register via text  Virginia Barron,   Please see education materials related to DM and HF provided  by MyChart link.  Patient verbalizes understanding of instructions and care plan provided today and agrees to view in [place]. Active MyChart status and patient understanding of how to access instructions and care plan via MyChart confirmed with patient.     Telephone follow up appointment with Managed Medicaid care management team member scheduled for:01/15/23 @ 10:30am  Virginia Joiner RN, BSN [place] RN Care Coordinator   Following is a copy of your plan of care:  Care Plan : RN Care Manager Plan of Care  Updates made by Virginia Montane, RN since 12/12/2022 12:00 AM     Problem: Health Management needs related to CHF and DMII      Long-Range Goal: Development of Plan of Care to address Health Management needs related to CHF and DMII   Start Date: 11/26/2022  Expected End Date: 03/26/2023  Note:   Current Barriers:  Chronic Disease Management support and education needs related to CHF and DMII  Patient seen by PCP and Cardiology. Now taking BP and DM medications. She received new CPAP yesterday and will start using. She request CM services for her adult son.    RNCM Clinical Goal(s):  Patient will verbalize understanding of plan for management of CHF and DMII as evidenced by patient reports take all medications exactly as prescribed and will call provider for medication related questions as evidenced by patient reports and EMR documentation    attend all scheduled medical appointments: 11/27/22 with Cardiology and 12/05/22 with PCP as evidenced by provider documentation        continue to work with [place] and/or Social Worker to address care management and care coordination needs related  to CHF and DMII as evidenced by adherence to CM Team Scheduled appointments       Interventions: Inter-disciplinary care team collaboration (see longitudinal plan of care) Evaluation of current treatment plan related to  self management and patient's adherence to  plan as established by provider Advised patient to discuss needed referrals with PCP on 12/19/22(foot exam and Colon Cancer Screening) Advised patient to contact [facility] [phone] for Member Benefits   Hypertension Interventions:  (Status:  New goal.) Long Term Goal Last practice recorded BP readings:  BP Readings from Last 3 Encounters:  12/05/22 (!) 140/80  11/27/22 (!) 178/116  11/18/22 116/76  Most recent eGFR/CrCl:  Lab Results  Component Value Date   EGFR 62 11/18/2022    No components found for: "CRCL"  Evaluation of current treatment plan related to hypertension self management and patient's adherence to plan as established by provider Provided education to patient re: stroke prevention, s/s of heart attack and stroke Reviewed medications with patient and discussed importance of compliance Advised patient, providing education and rationale, to monitor blood pressure daily and record, calling PCP for findings outside established parameters Reviewed scheduled/upcoming provider appointments including:  Reviewed home readings, last recorded BP 143/88  Heart Failure Interventions:  (Status: Goal on Track (progressing): YES.)  Long Term Goal  Wt Readings from Last 3 Encounters:  11/18/22 292 lb (132.5 kg)  09/03/21 294 lb (133.4 kg)  08/28/21 295 lb 6.4 oz (134 kg)   Provided education on low sodium diet Discussed the importance of keeping all appointments with provider Provided patient with education about the role of exercise in the management of heart failure Assessed social determinant of health barriers Ensured patient received CPAP-received yesterday, has not started using Advised patient to express any concerns, questions or changes in her health with provider  Diabetes:  (Status: Goal on Track (progressing): YES.) Long Term Goal   Lab Results  Component Value Date   HGBA1C 6.6 (H) 11/18/2022   @ Assessed patient's understanding of A1c  goal: <7% Reviewed medications with patient and discussed importance of medication adherence;        Counseled on importance of regular laboratory monitoring as prescribed;        Reviewed scheduled/upcoming provider appointments including: 12/16/22 with LCSW and MM Pharmacist, 12/18/22 for Mammogram, 12/19/22 with PCP and 12/23/22 with BSW;         Review of patient status, including review of consultants reports, relevant laboratory and other test results, and medications completed;       Assessed social determinant of health barriers;        Provided education regarding Diabetic Management via MyChart  Patient Goals/Self-Care Activities: Take medications as prescribed   Attend all scheduled provider appointments Call provider office for new concerns or questions  Work with the social worker to address care coordination needs and will continue to work with the clinical team to address health care and disease management related needs use salt in moderation watch for swelling in feet, ankles and legs every day eat more whole grains, fruits and vegetables, lean meats and healthy fats dress right for the weather, hot or cold keep appointment with eye doctor check feet daily for cuts, sores or redness drink 6 to 8 glasses of water each day fill half of plate with vegetables switch to sugar-free drinks

## 2022-12-12 NOTE — Patient Outreach (Signed)
Medicaid Managed Care   Nurse Care Manager Note  01/18/5620 Name:  Virginia Barron MRN:  [id number] DOB:  [birth year]  Virginia Barron is an 62 y.o. year old female who is a primary patient of Barron, Virginia Maxwell, MD.  The Medicaid Managed Care Coordination team was consulted for assistance with:    CHF HTN DMII  Virginia Barron was given information about Medicaid Managed Care Coordination team services today. Virginia Barron Patient agreed to services and verbal consent obtained.  Engaged with patient by telephone for follow up visit in response to provider referral for case management and/or care coordination services.   Assessments/Interventions:  Review of past medical history, allergies, medications, health status, including review of consultants reports, laboratory and other test data, was performed as part of comprehensive evaluation and provision of chronic care management services.  SDOH (Social Determinants of Health) assessments and interventions performed: SDOH Interventions    Flowsheet Row Patient Outreach Telephone from 12/12/2022 in [place] Patient Outreach Telephone from 12/04/2022 in [place] Patient Outreach Telephone from 11/26/2022 in [place] Patient Outreach Telephone from 11/21/2022 in [place] Interventions      Transportation Interventions Intervention Not Indicated -- -- --  Alcohol Usage Interventions -- -- Intervention Not Indicated (Score <7) --  Depression Interventions/Treatment  -- -- -- Referral to Psychiatry  Stress Interventions -- [employer], Provide Counseling -- Provide Counseling, Offered Community Wellness Resources       Care Plan  Allergies  Allergen Reactions   Avocado    Iodine Hives, Photosensitivity and Swelling   Kiwi Extract    Penicillins    Pork-Derived Products    Shellfish Allergy     Latex Hives, Itching, Rash and Swelling    Medications Reviewed Today     Reviewed by [name], RN (Registered Nurse) on 12/12/22 at [phone]  Med List Status: <None>   Medication Order Taking? Sig Documenting Provider Last Dose Status Informant  amLODipine (NORVASC) 10 MG tablet [id number] Yes Take 0.5 tablets (5 mg total) by mouth daily. Barron, Virginia Cliche, MD Taking Active   aspirin EC 325 MG tablet [id number] Yes Take 325 mg by mouth every 6 (six) hours as needed. [provider] Taking Active   cholecalciferol (VITAMIN D3) 25 MCG (1000 UNIT) tablet [id number] Yes Take 400 Units by mouth daily.  [provider] Taking Active   escitalopram (LEXAPRO) 10 MG tablet [id number] Yes Take 10 mg by mouth daily. [provider] Taking Active   losartan-hydrochlorothiazide (HYZAAR) 50-12.5 MG tablet [id number] Yes Take 1 tablet by mouth daily. Virginia Crouch, FNP Taking Active   metFORMIN (GLUCOPHAGE) 500 MG tablet [id number] Yes Take 1 tablet (500 mg total) by mouth daily with breakfast. Virginia Crouch, FNP Taking Active   Multiple Vitamin (MULTIVITAMIN) tablet [id number] Yes Take 1 tablet by mouth daily. [provider] Taking Active   triamcinolone acetonide (KENALOG-40) injection 80 mg [id number]   Virginia Brome, MD  Active             Patient Active Problem List   Diagnosis Date Noted   Type 2 diabetes mellitus without complication, without long-term current use of insulin ([place]) 12/05/2022   Encounter for screening mammogram for malignant neoplasm of breast 12/05/2022   Morbid obesity with body mass index (BMI) of 50.0 to 59.9 in adult [facility]) 11/26/2022   Primary osteoarthritis of left knee 11/18/2022   Chronic diastolic heart failure (  [place]) 11/18/2022   Dyslipidemia associated with type 2 diabetes mellitus ([place]) 09/09/2021   Essential hypertension 08/15/2020   Fibroids    Urge incontinence 07/30/2020   OSA (obstructive sleep apnea) 07/30/2020   Morbid obesity  with body mass index (BMI) of 45.0 to 49.9 in adult [facility]) 07/30/2020    Conditions to be addressed/monitored per PCP order:  CHF, HTN, and DMII  Care Plan : RN Care Manager Plan of Care  Updates made by [name], RN since 12/12/2022 12:00 AM     Problem: Health Management needs related to CHF and DMII      Long-Range Goal: Development of Plan of Care to address Health Management needs related to CHF and DMII   Start Date: 11/26/2022  Expected End Date: 03/26/2023  Note:   Current Barriers:  Chronic Disease Management support and education needs related to CHF and DMII  Patient seen by PCP and Cardiology. Now taking BP and DM medications. She received new CPAP yesterday and will start using. She request CM services for her adult son.    RNCM Clinical Goal(s):  Patient will verbalize understanding of plan for management of CHF and DMII as evidenced by patient reports take all medications exactly as prescribed and will call provider for medication related questions as evidenced by patient reports and EMR documentation    attend all scheduled medical appointments: 11/27/22 with Cardiology and 12/05/22 with PCP as evidenced by provider documentation        continue to work with [place] and/or Social Worker to address care management and care coordination needs related to CHF and DMII as evidenced by adherence to CM Team Scheduled appointments       Interventions: Inter-disciplinary care team collaboration (see longitudinal plan of care) Evaluation of current treatment plan related to  self management and patient's adherence to plan as established by provider Advised patient to discuss needed referrals with PCP on 12/19/22(foot exam and Colon Cancer Screening) Advised patient to contact [facility] [phone] for Member Benefits   Hypertension Interventions:  (Status:  New goal.) Long Term Goal Last practice recorded BP readings:  BP Readings from Last 3  Encounters:  12/05/22 (!) 140/80  11/27/22 (!) 178/116  11/18/22 116/76  Most recent eGFR/CrCl:  Lab Results  Component Value Date   EGFR 62 11/18/2022    No components found for: "CRCL"  Evaluation of current treatment plan related to hypertension self management and patient's adherence to plan as established by provider Provided education to patient re: stroke prevention, s/s of heart attack and stroke Reviewed medications with patient and discussed importance of compliance Advised patient, providing education and rationale, to monitor blood pressure daily and record, calling PCP for findings outside established parameters Reviewed scheduled/upcoming provider appointments including:  Reviewed home readings, last recorded BP 143/88  Heart Failure Interventions:  (Status: Goal on Track (progressing): YES.)  Long Term Goal  Wt Readings from Last 3 Encounters:  11/18/22 292 lb (132.5 kg)  09/03/21 294 lb (133.4 kg)  08/28/21 295 lb 6.4 oz (134 kg)   Provided education on low sodium diet Discussed the importance of keeping all appointments with provider Provided patient with education about the role of exercise in the management of heart failure Assessed social determinant of health barriers Ensured patient received CPAP-received yesterday, has not started using Advised patient to express any concerns, questions or changes in her health with provider  Diabetes:  (Status: Goal on Track (progressing): YES.) Long Term Goal  Lab Results  Component Value Date   HGBA1C 6.6 (H) 11/18/2022   @ Assessed patient's understanding of A1c goal: <7% Reviewed medications with patient and discussed importance of medication adherence;        Counseled on importance of regular laboratory monitoring as prescribed;        Reviewed scheduled/upcoming provider appointments including: 12/16/22 with LCSW and MM Pharmacist, 12/18/22 for Mammogram, 12/19/22 with PCP and 12/23/22 with BSW;         Review of  patient status, including review of consultants reports, relevant laboratory and other test results, and medications completed;       Assessed social determinant of health barriers;        Provided education regarding Diabetic Management via MyChart  Patient Goals/Self-Care Activities: Take medications as prescribed   Attend all scheduled provider appointments Call provider office for new concerns or questions  Work with the social worker to address care coordination needs and will continue to work with the clinical team to address health care and disease management related needs use salt in moderation watch for swelling in feet, ankles and legs every day eat more whole grains, fruits and vegetables, lean meats and healthy fats dress right for the weather, hot or cold keep appointment with eye doctor check feet daily for cuts, sores or redness drink 6 to 8 glasses of water each day fill half of plate with vegetables switch to sugar-free drinks       Follow Up:  Patient agrees to Care Plan and Follow-up.  Plan: The Managed Medicaid care management team will reach out to the patient again over the next 30 days.  Date/time of next scheduled RN care management/care coordination outreach:  01/15/22 @ 10:30am  Lurena Joiner RN, BSN [place] RN Care Coordinator

## 2022-12-16 ENCOUNTER — Other Ambulatory Visit: Payer: Medicaid Other | Admitting: Licensed Clinical Social Worker

## 2022-12-16 ENCOUNTER — Other Ambulatory Visit: Payer: Medicaid Other | Admitting: Pharmacist

## 2022-12-16 NOTE — Patient Instructions (Signed)
Visit Information  Ms. Eastham was given information about Medicaid Managed Care team care coordination services as a part of their Healthy [facility] Medicaid benefit. Ariane Whang verbally consented to engagement with the [facility] Managed Care team.   If you are experiencing a medical emergency, please call 911 or report to your local emergency department or urgent care.   If you have a non-emergency medical problem during routine business hours, please contact your provider's office and ask to speak with a nurse.   For questions related to your Healthy [facility] health plan, please call: [phone] or visit the homepage here: [email or web address]  If you would like to schedule transportation through your Healthy [facility] plan, please call the following number at least 2 days in advance of your appointment: [phone]  For information about your ride after you set it up, call Ride Assist at [phone]. Use this number to activate a Will Call pickup, or if your transportation is late for a scheduled pickup. Use this number, too, if you need to make a change or cancel a previously scheduled reservation.  If you need transportation services right away, call [phone]. The after-hours call center is staffed 24 hours to handle ride assistance and urgent reservation requests (including discharges) 365 days a year. Urgent trips include sick visits, hospital discharge requests and life-sustaining treatment.  Call the [facility] at [phone], at any time, 24 hours a day, 7 days a week. If you are in danger or need immediate medical attention call 911.  If you would like help to quit smoking, call 1-800-QUIT-NOW 725-767-4534) OR Espaol: 1-855-Djelo-Ya (1-856-314-9702) o para ms informacin haga clic aqu or Text READY to 200-400 to register via text    Following is a copy of your plan of care:  Care Plan : LCSW Plan of Care   Updates made by Greg Cutter, LCSW since 12/16/2022 12:00 AM     Problem: Depression Identification (Depression)      Long-Range Goal: Depressive Symptoms Identified   Start Date: 11/21/2022  Note:   Priority: High  Timeframe:  Long-Range Goal Priority:  High Start Date:   11/21/22            Expected End Date:  ongoing                     Follow Up Date--01/06/23 at 1030 am  - keep 90 percent of scheduled appointments -consider counseling or psychiatry -consider bumping up your self-care  -consider creating a stronger support network   Why is this important?             Combatting depression may take some time.            If you don't feel better right away, don't give up on your treatment plan.    Current barriers:   Chronic Mental Health needs related to depression and stress. Patient requires Support, Education, Resources, Referrals, Advocacy, and Care Coordination, in order to meet Unmet Mental Health Needs. Limited support network Patient will implement clinical interventions discussed today to decrease symptoms of depression and increase knowledge and/or ability of: coping skills. Mental Health Concerns and Social Isolation Patient lacks knowledge of available community counseling agencies and resources.  Clinical Goal(s): verbalize understanding of plan for management of Depression, and Stress and demonstrate a reduction in symptoms. Patient will connect with a provider for ongoing mental health treatment, increase coping skills, healthy habits, self-management skills, and stress reduction  Patient Goals/Self-Care Activities: Over the next 120 days Attend scheduled medical appointments Utilize healthy coping skills and supportive resources discussed Contact PCP with any questions or concerns Keep 90 percent of counseling appointments Call your insurance provider for more information about your Enhanced Benefits  Check out counseling resources provided  Begin  personal counseling with LCSW, to reduce and manage symptoms of Depression and Stress, until well-established with mental health provider Accept all calls from representative with [facility] [place] in an effort to establish ongoing mental health counseling and supportive services. Incorporate into daily practice - relaxation techniques, deep breathing exercises, and mindfulness meditation strategies. Talk about feelings with friends, family members, spiritual advisor, etc. Contact LCSW directly [phone]), if you have questions, need assistance, or if additional social work needs are identified between now and our next scheduled telephone outreach call. Call 988 for mental health hotline/crisis line if needed (24/7 available) Try techniques to reduce symptoms of anxiety/negative thinking (deep breathing, distraction, positive self talk, etc)  - develop a personal safety plan - develop a plan to deal with triggers like holidays, anniversaries - exercise at least 2 to 3 times per week - have a plan for how to handle bad days - journal feelings and what helps to feel better or worse - spend time or talk with others at least 2 to 3 times per week - watch for early signs of feeling worse - begin personal counseling - call and visit an old friend - check out volunteer opportunities - join a support group - laugh; watch a funny movie or comedian - learn and use visualization or guided imagery - perform a random act of kindness - practice relaxation or meditation daily - start or continue a personal journal - practice positive thinking and self-talk -continue with compliance of taking medication  -identify current effective and ineffective coping strategies.  -implement positive self-talk in care to increase self-esteem, confidence and feelings of control.  -consider alternative and complementary therapy approaches such as meditation, mindfulness or yoga.  -journaling, prayer,  worship services, meditation or pastoral counseling.  -increase participation in pleasurable group activities such as hobbies, singing, sports or volunteering).  -consider the use of meditative movement therapy such as tai chi, yoga or qigong.  -start a regular daily exercise program based on tolerance, ability and patient choice to support positive thinking and activity       11/21/2022   10:07 AM 11/18/2022    3:42 PM 04/08/2021    7:58 PM 10/23/2020   12:08 PM  Depression screen PHQ 2/9  Decreased Interest 1 1 0 0  Down, Depressed, Hopeless 1 0 0 2  PHQ - 2 Score 2 1 0 2  Altered sleeping 2 2  0  Tired, decreased energy 1 1  1  $ Change in appetite 0 0  2  Feeling bad or failure about yourself  0 0  0  Trouble concentrating 1 1  0  Moving slowly or fidgety/restless 0 0    Suicidal thoughts 0 0  1  PHQ-9 Score 6 5  6  $ Difficult doing work/chores Somewhat difficult Somewhat difficult  Somewhat difficult   10 LITTLE Things To Do When You're Feeling Too Down To Do Anything  Take a shower. Even if you plan to stay in all day long and not see a soul, take a shower. It takes the most effort to hop in to the shower but once you do, you'll feel immediate results. It will wake you up  and you'll be feeling much fresher (and cleaner too).  Brush and floss your teeth. Give your teeth a good brushing with a floss finish. It's a small task but it feels so good and you can check 'taking care of your health' off the list of things to do.  Do something small on your list. Most of [place] have some small thing we would like to get done (load of laundry, sew a button, email a friend). Doing one of these things will make you feel like you've accomplished something.  Drink water. Drinking water is easy right? It's also really beneficial for your health so keep a glass beside you all day and take sips often. It gives you energy and prevents you from boredom eating.  Do some floor exercises. The last thing  you want to do is exercise but it might be just the thing you need the most. Keep it simple and do exercises that involve sitting or laying on the floor. Even the smallest of exercises release chemicals in the brain that make you feel good. Yoga stretches or core exercises are going to make you feel good with minimal effort.  Make your bed. Making your bed takes a few minutes but it's productive and you'll feel relieved when it's done. An unmade bed is a huge visual reminder that you're having an unproductive day. Do it and consider it your housework for the day.  Put on some nice clothes. Take the sweatpants off even if you don't plan to go anywhere. Put on clothes that make you feel good. Take a look in the mirror so your brain recognizes the sweatpants have been replaced with clothes that make you look great. It's an instant confidence booster.  Wash the dishes. A pile of dirty dishes in the sink is a reflection of your mood. It's possible that if you wash up the dishes, your mood will follow suit. It's worth a try.  Cook a real meal. If you have the luxury to have a "do nothing" day, you have time to make a real meal for yourself. Make a meal that you love to eat. The process is good to get you out of the funk and the food will ensure you have more energy for tomorrow.  Write out your thoughts by hand. When you hand write, you stimulate your brain to focus on the moment that you're in so make yourself comfortable and write whatever comes into your mind. Put those thoughts out on paper so they stop spinning around in your head. Those thoughts might be the very thing holding you down.  If you are experiencing a Mental Health or [place] or need someone to talk to, please call the Suicide and Crisis Lifeline: 988    Patient Goals: Follow up goal

## 2022-12-16 NOTE — Patient Instructions (Signed)
Angie,   It was great talking to you today!  You can redeem some benefits online through your secure account. View the extra benefits you're eligible for on the Benefit Reward Hub or call Member Services at [phone]) to redeem.

## 2022-12-16 NOTE — Progress Notes (Signed)
4/0/9811 Name: Virginia Barron MRN: [id number] DOB: 10/19/1961  Chief Complaint  Patient presents with   Medication Management   Hypertension    Virginia Barron is a 62 y.o. year old female who presented for a telephone visit.   They were referred to the pharmacist by their PCP for assistance in managing diabetes, hypertension, hyperlipidemia, medication access, and complex medication management.   Patient is participating in a Managed Medicaid Plan:  Yes  Subjective:  Care Team: Primary Care Provider: [name], MD ; Next Scheduled Visit: NP [name]   Medication Access/Adherence  Current Pharmacy:  [facility], [place] [address] Phone: [phone] Fax: [phone]   Patient reports affordability concerns with their medications: No  Patient reports access/transportation concerns to their pharmacy: No  Patient reports adherence concerns with their medications:  No     Diabetes:  Current medications: metformin 500 mg daily (has been on for ~ a week)  Current meal patterns:  - Breakfast: strawberry, banana, granola - Lunch: chicken and rice - Supper: meat and potatoes  - Snacks: cookies (maybe)  - Drinks: water, sometimes powerade   Reports her food stamps only last for about half the month; she has a better variety of food in the second half of the month. Reviewed that [employer] advertises benefits for food assistance, but she has tried to talk to Customer Service about it with no luck.   Hypertension:  Current medications: losartan 50 mg daily, HCTZ 12.5 mg daily, amlodipine 5 mg daily  Denies any intolerability since starting back on her antihypertensives.   Patient has an upper arm home BP cuff, but does not have a cuff size that fits her.  Current blood pressure readings readings: reports home readings SBP 150s  Patient denies hypotensive s/sx including dizziness,  lightheadedness.  Patient denies hypertensive symptoms including headache, chest pain, shortness of breath - reports she did have a belt-like sensation around her head when her pressures were previously in the 180s   Hyperlipidemia/ASCVD Risk Reduction  Current lipid lowering medications: none Medications tried in the past: rosuvastatin, but was unable to afford so it was discontinued. Prior CAC was 0, but has since been diagnosed with diabetes.   Clinical ASCVD: No  The 10-year ASCVD risk score (Arnett DK, et al., 2019) is: 17.8%   Values used to calculate the score:     Age: 19 years     Sex: Female     Is Non-Hispanic African American: Yes     Diabetic: Yes     Tobacco smoker: No     Systolic Blood Pressure: 102 mmHg     Is BP treated: Yes     HDL Cholesterol: 45 mg/dL     Total Cholesterol: 143 mg/dL    Health Maintenance  Health Maintenance Due  Topic Date Due   FOOT EXAM  Never done   HIV Screening  Never done   Diabetic kidney evaluation - Urine ACR  Never done   Hepatitis C Screening  Never done   DTaP/Tdap/Td (1 - Tdap) Never done   COLONOSCOPY (Pts 45-44yr Insurance coverage will need to be confirmed)  Never done   INFLUENZA VACCINE  07/09/2022   COVID-19 Vaccine (4 - 2023-24 season) 08/09/2022   MAMMOGRAM  08/17/2022     Objective: Lab Results  Component Value Date   HGBA1C 6.6 (H) 11/18/2022    Lab Results  Component Value Date  CREATININE 1.02 (H) 11/18/2022   BUN 15 11/18/2022   NA 144 11/18/2022   K 3.9 11/18/2022   CL 106 11/18/2022   CO2 19 (L) 11/18/2022    Lab Results  Component Value Date   CHOL 143 12/05/2022   HDL 45 12/05/2022   LDLCALC 87 12/05/2022   TRIG 53 12/05/2022   CHOLHDL 3.2 12/05/2022    Medications Reviewed Today     Reviewed by Osker Mason, RPH-CPP (Pharmacist) on 12/16/22 at 1410  Med List Status: <None>   Medication Order Taking? Sig Documenting Provider Last Dose Status Informant  amLODipine (NORVASC)  10 MG tablet [id number] Yes Take 0.5 tablets (5 mg total) by mouth daily. Revankar, Reita Cliche, MD Taking Active     Discontinued 12/16/22 1410 (Completed Course)   cholecalciferol (VITAMIN D3) 25 MCG (1000 UNIT) tablet [id number] Yes Take 400 Units by mouth daily.  [provider] Taking Active   escitalopram (LEXAPRO) 10 MG tablet [id number] Yes Take 10 mg by mouth daily. [provider] Taking Active   hydrochlorothiazide (MICROZIDE) 12.5 MG capsule [id number] Yes Take 12.5 mg by mouth daily. [provider] Taking Active   losartan (COZAAR) 50 MG tablet [id number] Yes Take 50 mg by mouth daily. [provider] Taking Active   metFORMIN (GLUCOPHAGE) 500 MG tablet [id number] Yes Take 1 tablet (500 mg total) by mouth daily with breakfast. [name], FNP Taking Active   Multiple Vitamin (MULTIVITAMIN) tablet [id number] Yes Take 1 tablet by mouth daily. [provider] Taking Active   triamcinolone acetonide (KENALOG-40) injection 80 mg [id number]   [name], MD  Consider Medication Status and Discontinue (Completed Course)               Assessment/Plan:   Diabetes: - Currently uncontrolled, impacted by medication and nutrition access - Recommend to continue metformin. Will collaborate with SW teams regarding financial and food resources.   Hypertension: - Currently uncontrolled but improved.  - Reviewed long term cardiovascular and renal outcomes of uncontrolled blood pressure - Contacted [facility] - they do not bill Medicaid for DME. Will collaborate with PCP office to fax order for BP cuff with XL cuff size to [employer], Fax [phone].  - Reviewed appropriate blood pressure monitoring technique and reviewed goal blood pressure. Recommended to check home blood pressure and heart rate daily - Recommend to continue current regimen. Recommend to increase losartan to 100 mg daily. Additionally, alternative ARB with longer  half life such as valsartan may be more clinically effective.    Hyperlipidemia/ASCVD Risk Reduction: - Currently uncontrolled.  - Discussed restarting statin. Patient amenable. Will recommend initiation of rosuvastatin 5 mg daily to PCP.      Follow Up Plan: phone call in 4 weeks  Virginia Barron, PharmD, [place], [facility] [phone]

## 2022-12-16 NOTE — Patient Outreach (Signed)
Medicaid Managed Care Social Work Note  08/15/6733 Name:  Virginia Barron MRN:  [id number] DOB:  [birth year]  Virginia Barron is an 62 y.o. year old female who is a primary patient of [name], Kirsten, MD.  The Medicaid Managed Care Coordination team was consulted for assistance with:  [place] and Resources  Virginia Barron was given information about Medicaid Managed Care Coordination team services today. Virginia Barron Patient agreed to services and verbal consent obtained.  Engaged with patient  for by telephone forfollow up visit in response to referral for case management and/or care coordination services.   Assessments/Interventions:  Review of past medical history, allergies, medications, health status, including review of consultants reports, laboratory and other test data, was performed as part of comprehensive evaluation and provision of chronic care management services.  SDOH: (Social Determinant of Health) assessments and interventions performed: SDOH Interventions    Flowsheet Row Patient Outreach Telephone from 12/16/2022 in [place] Patient Outreach Telephone from 12/12/2022 in [place] Patient Outreach Telephone from 12/04/2022 in [place] Patient Outreach Telephone from 11/26/2022 in [place] Patient Outreach Telephone from 11/21/2022 in [place]  SDOH Interventions       Transportation Interventions -- Intervention Not Indicated -- -- --  Alcohol Usage Interventions -- -- -- Intervention Not Indicated (Score <7) --  Depression Interventions/Treatment  -- -- -- -- Referral to Psychiatry  Stress Interventions Provide Counseling, Offered [employer], Other (Comment)  [Starting services with [place], Provide Counseling -- Provide Counseling, [employer] Resources       Advanced Directives Status:  See Care Plan for related entries.  Care Plan                 Allergies  Allergen Reactions   Avocado    Iodine Hives, Photosensitivity and Swelling   Kiwi Extract    Penicillins    Pork-Derived Products    Shellfish Allergy    Latex Hives, Itching, Rash and Swelling    Medications Reviewed Today     Reviewed by [name], RN (Registered Nurse) on 12/12/22 at [phone]  Med List Status: <None>   Medication Order Taking? Sig Documenting Provider Last Dose Status Informant  amLODipine (NORVASC) 10 MG tablet [id number] Yes Take 0.5 tablets (5 mg total) by mouth daily. Revankar, Reita Cliche, MD Taking Active   aspirin EC 325 MG tablet [id number] Yes Take 325 mg by mouth every 6 (six) hours as needed. [provider] Taking Active   cholecalciferol (VITAMIN D3) 25 MCG (1000 UNIT) tablet [id number] Yes Take 400 Units by mouth daily.  [provider] Taking Active   escitalopram (LEXAPRO) 10 MG tablet [id number] Yes Take 10 mg by mouth daily. [provider] Taking Active   losartan-hydrochlorothiazide (HYZAAR) 50-12.5 MG tablet [id number] Yes Take 1 tablet by mouth daily. [name], FNP Taking Active   metFORMIN (GLUCOPHAGE) 500 MG tablet [id number] Yes Take 1 tablet (500 mg total) by mouth daily with breakfast. [name], FNP Taking Active   Multiple Vitamin (MULTIVITAMIN) tablet [id number] Yes Take 1 tablet by mouth daily. [provider] Taking Active   triamcinolone acetonide (KENALOG-40) injection 80 mg [id number]   [name], MD  Active             Patient Active Problem List   Diagnosis Date Noted   Type 2 diabetes mellitus without complication,  without long-term current use of insulin ([place]) 12/05/2022   Encounter for screening mammogram for malignant neoplasm of breast 12/05/2022   Morbid obesity with body mass index (BMI) of 50.0 to 59.9 in adult ([place]) 11/26/2022   Primary  osteoarthritis of left knee 11/18/2022   Chronic diastolic heart failure ([place]) 11/18/2022   Dyslipidemia associated with type 2 diabetes mellitus ([place]) 09/09/2021   Essential hypertension 08/15/2020   Fibroids    Urge incontinence 07/30/2020   OSA (obstructive sleep apnea) 07/30/2020   Morbid obesity with body mass index (BMI) of 45.0 to 49.9 in adult [facility] 07/30/2020    Conditions to be addressed/monitored per PCP order:  Depression  Care Plan : LCSW Plan of Care  Updates made by Greg Cutter, LCSW since 12/16/2022 12:00 AM     Problem: Depression Identification (Depression)      Long-Range Goal: Depressive Symptoms Identified   Start Date: 11/21/2022  Note:   Priority: High  Timeframe:  Long-Range Goal Priority:  High Start Date:   11/21/22            Expected End Date:  ongoing                     Follow Up Date--01/06/23 at 14 am  - keep 90 percent of scheduled appointments -consider counseling or psychiatry -consider bumping up your self-care  -consider creating a stronger support network   Why is this important?             Combatting depression may take some time.            If you don't feel better right away, don't give up on your treatment plan.    Current barriers:   Chronic Mental Health needs related to depression and stress. Patient requires Support, Education, Resources, Referrals, Advocacy, and Care Coordination, in order to meet Unmet Mental Health Needs. Limited support network Patient will implement clinical interventions discussed today to decrease symptoms of depression and increase knowledge and/or ability of: coping skills. Mental Health Concerns and Social Isolation Patient lacks knowledge of available community counseling agencies and resources.  Clinical Goal(s): verbalize understanding of plan for management of Depression, and Stress and demonstrate a reduction in symptoms. Patient will connect with a provider for ongoing mental health  treatment, increase coping skills, healthy habits, self-management skills, and stress reduction        Clinical Interventions:  Assessed patient's previous and current treatment, coping skills, support system and barriers to care. Patient provided hx  Verbalization of feelings encouraged, motivational interviewing employed Emotional support provided, positive coping strategies explored. Establishing healthy boundaries emphasized and healthy self-care education provided Patient was educated on available mental health resources within their area that accept Medicaid and offer counseling and psychiatry. Patient reports significant worsening depression and stress impacting her ability to function appropriately and carry out daily task. Patient reports never having mental health support. 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 She reports that she has a close friend Virginia Barron that lives in [place] that she talks to on the phone to gain socialization.  Patient is agreeable to [facility] to [facility]. Patient prefers virtual counseling. [facility] LCSW made referral on 11/21/22. UPDATE- Patient reports that she was able to establish contact with agency but she thinks her initial appointment with them was only with a psychiatrist but she will look through her paperwork and double check. Patient reports interest in gaining  counseling as well. Email will be sent to [place] if needed during next social work outreach. UPDATE- Patient has been enrolled in [place] program. She has started psychiatry services only and will consider implementing counseling services as well. [facility] LCSW provided education on basic self-care that she can use daily to prevent winter sickness and promote positive physical and mental health. LCSW provided education on relaxation techniques such as meditation, deep breathing, massage, grounding exercises or yoga that can activate the body's relaxation  response and ease symptoms of stress and anxiety. LCSW ask that when pt is struggling with difficult emotions and racing thoughts that they start this relaxation response process. LCSW provided extensive education on healthy coping skills for anxiety. SW used active and reflective listening, validated patient's feelings/concerns, and provided emotional support. Patient will work on implementing appropriate self-care habits into their daily routine such as: staying positive, writing a gratitude list, drinking water, staying active around the house, taking their medications as prescribed, combating negative thoughts or emotions and staying connected with their family and friends. Positive reinforcement provided for this decision to work on this. Motivational Interviewing employed Depression screen reviewed  PHQ2/ PHQ9 completed or reviewed  Mindfulness or Relaxation training provided Active listening / Reflection utilized  Advance Care and HCPOA education provided Emotional Support Provided Problem [place] strategies reviewed Provided psychoeducation for mental health needs  Provided brief CBT  Reviewed mental health medications and discussed importance of compliance:  Quality of sleep assessed & Sleep Hygiene techniques promoted  Participation in counseling encouraged  Verbalization of feelings encouraged  Suicidal Ideation/Homicidal Ideation assessed: Patient denies SI/HI  Review resources, discussed options and provided patient information about  [place] care team collaboration (see longitudinal plan of care) Patient Goals/Self-Care Activities: Over the next 120 days Attend scheduled medical appointments Utilize healthy coping skills and supportive resources discussed Contact PCP with any questions or concerns Keep 90 percent of counseling appointments Call your insurance provider for more information about your Enhanced Benefits  Check out  counseling resources provided  Begin personal counseling with LCSW, to reduce and manage symptoms of Depression and Stress, until well-established with mental health provider Accept all calls from representative with [facility] in an effort to establish ongoing mental health counseling and supportive services. Incorporate into daily practice - relaxation techniques, deep breathing exercises, and mindfulness meditation strategies. Talk about feelings with friends, family members, spiritual advisor, etc. Contact LCSW directly [phone]), if you have questions, need assistance, or if additional social work needs are identified between now and our next scheduled telephone outreach call. Call [phone] for mental health hotline/crisis line if needed (24/7 available) Try techniques to reduce symptoms of anxiety/negative thinking (deep breathing, distraction, positive self talk, etc)  - develop a personal safety plan - develop a plan to deal with triggers like holidays, anniversaries - exercise at least 2 to 3 times per week - have a plan for how to handle bad days - journal feelings and what helps to feel better or worse - spend time or talk with others at least 2 to 3 times per week - watch for early signs of feeling worse - begin personal counseling - call and visit an old friend - check out volunteer opportunities - join a support group - laugh; watch a funny movie or comedian - learn and use visualization or guided imagery - perform a random act of kindness - practice relaxation or meditation daily - start or continue a personal journal -  practice positive thinking and self-talk -continue with compliance of taking medication  -identify current effective and ineffective coping strategies.  -implement positive self-talk in care to increase self-esteem, confidence and feelings of control.  -consider alternative and complementary therapy approaches such as meditation,  mindfulness or yoga.  -journaling, prayer, worship services, meditation or pastoral counseling.  -increase participation in pleasurable group activities such as hobbies, singing, sports or volunteering).  -consider the use of meditative movement therapy such as tai chi, yoga or qigong.  -start a regular daily exercise program based on tolerance, ability and patient choice to support positive thinking and activity       11/21/2022   10:07 AM 11/18/2022    3:42 PM 04/08/2021    7:58 PM 10/23/2020   12:08 PM  Depression screen PHQ 2/9  Decreased Interest 1 1 0 0  Down, Depressed, Hopeless 1 0 0 2  PHQ - 2 Score 2 1 0 2  Altered sleeping 2 2  0  Tired, decreased energy '1 1  1  '$ Change in appetite 0 0  2  Feeling bad or failure about yourself  0 0  0  Trouble concentrating 1 1  0  Moving slowly or fidgety/restless 0 0    Suicidal thoughts 0 0  1  PHQ-9 Score '6 5  6  '$ Difficult doing work/chores Somewhat difficult Somewhat difficult  Somewhat difficult   10 LITTLE Things To Do When You're Feeling Too Down To Do Anything  Take a shower. Even if you plan to stay in all day long and not see a soul, take a shower. It takes the most effort to hop in to the shower but once you do, you'll feel immediate results. It will wake you up and you'll be feeling much fresher (and cleaner too).  Brush and floss your teeth. Give your teeth a good brushing with a floss finish. It's a small task but it feels so good and you can check 'taking care of your health' off the list of things to do.  Do something small on your list. Most of [place] have some small thing we would like to get done (load of laundry, sew a button, email a friend). Doing one of these things will make you feel like you've accomplished something.  Drink water. Drinking water is easy right? It's also really beneficial for your health so keep a glass beside you all day and take sips often. It gives you energy and prevents you from boredom  eating.  Do some floor exercises. The last thing you want to do is exercise but it might be just the thing you need the most. Keep it simple and do exercises that involve sitting or laying on the floor. Even the smallest of exercises release chemicals in the brain that make you feel good. Yoga stretches or core exercises are going to make you feel good with minimal effort.  Make your bed. Making your bed takes a few minutes but it's productive and you'll feel relieved when it's done. An unmade bed is a huge visual reminder that you're having an unproductive day. Do it and consider it your housework for the day.  Put on some nice clothes. Take the sweatpants off even if you don't plan to go anywhere. Put on clothes that make you feel good. Take a look in the mirror so your brain recognizes the sweatpants have been replaced with clothes that make you look great. It's an instant confidence booster.  Wash the dishes.  A pile of dirty dishes in the sink is a reflection of your mood. It's possible that if you wash up the dishes, your mood will follow suit. It's worth a try.  Cook a real meal. If you have the luxury to have a "do nothing" day, you have time to make a real meal for yourself. Make a meal that you love to eat. The process is good to get you out of the funk and the food will ensure you have more energy for tomorrow.  Write out your thoughts by hand. When you hand write, you stimulate your brain to focus on the moment that you're in so make yourself comfortable and write whatever comes into your mind. Put those thoughts out on paper so they stop spinning around in your head. Those thoughts might be the very thing holding you down.  If you are experiencing a Mental Health or [place] or need someone to talk to, please call the Suicide and Crisis Lifeline: 988    Patient Goals: Follow up goal      Follow up:  Patient agrees to Care Plan and Follow-up.  Plan: The Managed  Medicaid care management team will reach out to the patient again over the next 30 days.  Date/time of next scheduled Social Work care management/care coordination outreach:  01/06/23 at 35 am  Eula Fried, BSW, MSW, [place] Medicaid LCSW [email or web address] Phone: [phone]

## 2022-12-18 ENCOUNTER — Encounter: Payer: Self-pay | Admitting: Family Medicine

## 2022-12-18 ENCOUNTER — Ambulatory Visit
Admission: RE | Admit: 2022-12-18 | Discharge: 2022-12-18 | Disposition: A | Discharge status: Home / Self Care | Payer: Medicaid Other | Source: Ambulatory Visit | Attending: Nurse Practitioner | Admitting: Nurse Practitioner

## 2022-12-18 ENCOUNTER — Other Ambulatory Visit: Payer: Self-pay | Admitting: Nurse Practitioner

## 2022-12-18 DIAGNOSIS — Z1231 Encounter for screening mammogram for malignant neoplasm of breast: Secondary | ICD-10-CM | POA: Diagnosis not present

## 2022-12-18 MED ORDER — SPHYGMOMANOMETER MISC: 1.0000 | Freq: Two times a day (BID) | 0 refills | Status: DC

## 2022-12-19 ENCOUNTER — Encounter: Payer: Self-pay | Admitting: Nurse Practitioner

## 2022-12-19 ENCOUNTER — Ambulatory Visit: Payer: Medicaid Other | Admitting: Nurse Practitioner

## 2022-12-19 VITALS — BP 130/80 | HR 74 | Temp 97.3°F | Resp 18 | Ht 65.0 in | Wt 287.0 lb

## 2022-12-19 DIAGNOSIS — E785 Hyperlipidemia, unspecified: Secondary | ICD-10-CM

## 2022-12-19 DIAGNOSIS — I10 Essential (primary) hypertension: Secondary | ICD-10-CM | POA: Diagnosis not present

## 2022-12-19 DIAGNOSIS — E1169 Type 2 diabetes mellitus with other specified complication: Secondary | ICD-10-CM | POA: Diagnosis not present

## 2022-12-19 DIAGNOSIS — I5032 Chronic diastolic (congestive) heart failure: Secondary | ICD-10-CM | POA: Diagnosis not present

## 2022-12-19 MED ORDER — ROSUVASTATIN CALCIUM 5 MG PO TABS: 5.0000 mg | ORAL_TABLET | Freq: Every day | ORAL | 3 refills | Status: DC

## 2022-12-19 MED ORDER — VALSARTAN-HYDROCHLOROTHIAZIDE 320-25 MG PO TABS: 1.0000 | ORAL_TABLET | Freq: Every day | ORAL | 3 refills | Status: DC

## 2022-12-19 NOTE — Patient Instructions (Addendum)
Start Crestor 5 mg, if you feel muscle pain, take with over the counter CO-Q10 100 mg once daily Start new BP medicines (discard old BP medicines) Follow up in April 2024 but If BP constantly higher than 130/80 follow up sooner  Rosuvastatin Tablets What is this medication? ROSUVASTATIN (roe SOO va sta tin) treats high cholesterol and reduces the risk of heart attack and stroke. It works by decreasing bad cholesterol and fats (such as LDL, triglycerides) and increasing good cholesterol (HDL) in your blood. It belongs to a group of medications called statins. Changes to diet and exercise are often combined with this medication. This medicine may be used for other purposes; ask your health care provider or pharmacist if you have questions. COMMON BRAND NAME(S): Crestor What should I tell my care team before I take this medication? They need to know if you have any of these conditions: Diabetes Frequently drink alcohol Kidney disease Liver disease Muscle cramps, pain Stroke Thyroid disease An unusual or allergic reaction to rosuvastatin, other medications, foods, dyes, or preservatives Pregnant or trying to get pregnant Breastfeeding How should I use this medication? Take this medication by mouth with a glass of water. Follow the directions on the prescription label. You can take it with or without food. If it upsets your stomach, take it with food. Do not cut, crush or chew this medication. Swallow the tablets whole. Take your medication at regular intervals. Do not take it more often than directed. Take antacids that have a combination of aluminum and magnesium hydroxide in them at a different time of day than this medication. Take these products 2 hours AFTER this medication. Talk to your care team about the use of this medication in children. While this medication may be prescribed for children as young as 7 for selected conditions, precautions do apply. Overdosage: If you think you have  taken too much of this medicine contact a poison control center or emergency room at once. NOTE: This medicine is only for you. Do not share this medicine with others. What if I miss a dose? If you miss a dose, take it as soon as you can. If your next dose is to be taken in less than 12 hours, then do not take the missed dose. Take the next dose at your regular time. Do not take double or extra doses. What may interact with this medication? Do not take this medication with any of the following: Supplements like red yeast rice This medication may also interact with the following: Alcohol Antacids containing aluminum hydroxide and magnesium hydroxide Cyclosporine Other medications for high cholesterol Some medications for HIV infection Warfarin This list may not describe all possible interactions. Give your health care provider a list of all the medicines, herbs, non-prescription drugs, or dietary supplements you use. Also tell them if you smoke, drink alcohol, or use illegal drugs. Some items may interact with your medicine. What should I watch for while using this medication? Visit your care team for regular checks on your progress. Tell your care team if your symptoms do not start to get better or if they get worse. Your care team may tell you to stop taking this medication if you develop muscle problems. If your muscle problems do not go away after stopping this medication, contact your care team. Talk to your care team if you may be pregnant. Serious birth defects can occur if you take this medication during pregnancy. Talk to your care team before breastfeeding. Changes to your  treatment plan may be needed. This medication may increase blood sugar. Ask your care team if changes in diet or medications are needed if you have diabetes. If you are going to need surgery or other procedure, tell your care team that you are using this medication. Taking this medication is only part of a total  heart healthy program. Ask your care team if there are other changes you can make to improve your overall health. What side effects may I notice from receiving this medication? Side effects that you should report to your care team as soon as possible: Allergic reactions--skin rash, itching, hives, swelling of the face, lips, tongue, or throat High blood sugar (hyperglycemia)--increased thirst or amount of urine, unusual weakness, fatigue, blurry vision Liver injury--right upper belly pain, loss of appetite, nausea, light-colored stool, dark yellow or brown urine, yellowing skin or eyes, unusual weakness, fatigue Muscle injury--unusual weakness, fatigue, muscle pain, dark yellow or brown urine, decrease in amount of urine Redness, blistering, peeling or loosening of the skin, including inside the mouth Side effects that usually do not require medical attention (report to your care team if they continue or are bothersome): Fatigue Headache Nausea Stomach pain This list may not describe all possible side effects. Call your doctor for medical advice about side effects. You may report side effects to FDA at 1-800-FDA-1088. Where should I keep my medication? Keep out of the reach of children and pets. Store between 20 and 25 degrees C (68 and 77 degrees F). Get rid of any unused medication after the expiration date. To get rid of medications that are no longer needed or have expired: Take the medication to a medication take-back program. Check with your pharmacy or law enforcement to find a location. If you cannot return the medication, check the label or package insert to see if the medication should be thrown out in the garbage or flushed down the toilet. If you are not sure, ask your care team. If it is safe to put it in the trash, take the medication out of the container. Mix the medication with cat litter, dirt, coffee grounds, or other unwanted substance. Seal the mixture in a bag or container.  Put it in the trash. NOTE: This sheet is a summary. It may not cover all possible information. If you have questions about this medicine, talk to your doctor, pharmacist, or health care provider.  2023 Elsevier/Gold Standard (2020-12-22 00:00:00)

## 2022-12-19 NOTE — Assessment & Plan Note (Addendum)
Crestor 5 mg added and asked to take it CO-Q10 '100mg'$  if she has muscle pain  Nutrition: Stressed importance of moderation in sodium intake, saturated fat and cholesterol, caloric balance, sufficient intake of complex carbohydrates, fiber, calcium and iron.   Exercise: Stressed the importance of regular exercise.

## 2022-12-19 NOTE — Assessment & Plan Note (Addendum)
Discontinued Lasartan 50 mg Discontinued Hydrochocholorthizide 12.'5mg'$  Continue Amlodipine 5 mg Start Diovan-HCT 320-25 Continue keeping log book and follow up if BP not under controlled  Nutrition: Stressed importance of moderation in sodium intake, saturated fat and cholesterol, caloric balance, sufficient intake of complex carbohydrates, fiber, calcium and iron.   Exercise: Stressed the importance of regular exercise.

## 2022-12-19 NOTE — Progress Notes (Signed)
Subjective:  Patient ID: Virginia Barron, female    DOB: 10-26-1961  Age: 62 y.o. MRN: [id number]  Chief Complaints:  2 weeks follow up after BP medicine adjustment   History of Present illness: Patient is taking Losartan 50 mg and HYDROCHLOROTHIAZIDE  12.5 mg and amlodipine 10 mg for BP but her BP is constantly high. Patient brought her log book of two weeks, mostly BP is 275-170'Y systolic and 17-494'W diastolic, maximum high BP was 192/107. Patient is wondering why her blood pressure not under controlled. She is watching diet and doing some exercise, said cold weather is making hard to walk outside. She is living with her son and taking care of him because of his health condition.    Current Outpatient Medications on File Prior to Visit  Medication Sig Dispense Refill   amLODipine (NORVASC) 10 MG tablet Take 0.5 tablets (5 mg total) by mouth daily. 90 tablet 3   Blood Pressure Monitoring (SPHYGMOMANOMETER) MISC 1 each by Does not apply route in the morning and at bedtime. 1 each 0   cholecalciferol (VITAMIN D3) 25 MCG (1000 UNIT) tablet Take 400 Units by mouth daily.      escitalopram (LEXAPRO) 10 MG tablet Take 10 mg by mouth daily.     hydrochlorothiazide (MICROZIDE) 12.5 MG capsule Take 12.5 mg by mouth daily.     losartan (COZAAR) 50 MG tablet Take 50 mg by mouth daily.     metFORMIN (GLUCOPHAGE) 500 MG tablet Take 1 tablet (500 mg total) by mouth daily with breakfast. 90 tablet 1   Multiple Vitamin (MULTIVITAMIN) tablet Take 1 tablet by mouth daily.     Current Facility-Administered Medications on File Prior to Visit  Medication Dose Route Frequency Provider Last Rate Last Admin   triamcinolone acetonide (KENALOG-40) injection 80 mg  80 mg Intra-articular Once [name], MD       Past Medical History:  Diagnosis Date   Chronic diastolic heart failure ([place]) 11/18/2022   Dyslipidemia associated with type 2 diabetes mellitus ([place]) 09/09/2021   Essential hypertension 08/15/2020    Fibroids    Morbid obesity with body mass index (BMI) of 45.0 to 49.9 in adult [facility]) 07/30/2020   Morbid obesity with body mass index (BMI) of 50.0 to 59.9 in adult ([place]) 07/30/2020   OSA (obstructive sleep apnea) 07/30/2020   Primary osteoarthritis of left knee 11/18/2022   Urge incontinence 07/30/2020   Past Surgical History:  Procedure Laterality Date   ABDOMINAL HYSTERECTOMY     precancerous cervical cells and fibroids.   conescopy     abnormal pap   LASIK     TUBAL LIGATION      Family History  Problem Relation Age of Onset   Diabetes Mother    Diabetes Father    Heart attack Father    Diabetes Sister    Breast cancer Neg Hx    Social History   Socioeconomic History   Marital status: Divorced    Spouse name: Not on file   Number of children: Not on file   Years of education: Not on file   Highest education level: Not on file  Occupational History   Not on file  Tobacco Use   Smoking status: Former    Types: Cigarettes    Quit date: 1980    Years since quitting: 44.0   Smokeless tobacco: Never  Substance and Sexual Activity   Alcohol use: Yes    Alcohol/week: 1.0 standard drink of alcohol    Types:  1 Standard drinks or equivalent per week    Comment: per month   Drug use: Never   Sexual activity: Not on file  Other Topics Concern   Not on file  Social History Narrative   Not on file   Social Determinants of Health   Financial Resource Strain: Medium Risk (11/20/2022)   Overall Financial Resource Strain (CARDIA)    Difficulty of Paying Living Expenses: Somewhat hard  Food Insecurity: Food Insecurity Present (11/20/2022)   Hunger Vital Sign    Worried About Running Out of Food in the Last Year: Sometimes true    Ran Out of Food in the Last Year: Sometimes true  Transportation Needs: No Transportation Needs (12/12/2022)   PRAPARE - Hydrologist (Medical): No    Lack of Transportation (Non-Medical): No  Physical Activity:  Inactive (11/24/2022)   Exercise Vital Sign    Days of Exercise per Week: 0 days    Minutes of Exercise per Session: 0 min  Stress: Stress Concern Present (12/16/2022)   [place]    Feeling of Stress : To some extent  Social Connections: Not on file    Review of Systems  Constitutional:  Negative for chills, diaphoresis, fatigue and fever.  HENT:  Negative for congestion, ear pain and sore throat.   Respiratory:  Negative for cough and shortness of breath.   Cardiovascular:  Negative for chest pain and palpitations.  Gastrointestinal:  Negative for abdominal pain, constipation, diarrhea, nausea and vomiting.  Endocrine: Negative for polydipsia, polyphagia and polyuria.  Genitourinary:  Negative for dysuria and frequency.  Musculoskeletal:  Negative for arthralgias, back pain and myalgias.  Neurological:  Negative for dizziness and headaches.  Psychiatric/Behavioral:  Negative for dysphoric mood. The patient is not nervous/anxious.      Objective:  BP 130/80   Pulse 74   Temp (!) 97.3 F (36.3 C)   Resp 18   Ht '5\' 5"'$  (1.651 m)   Wt 287 lb (130.2 kg)   SpO2 96%   BMI 47.76 kg/m       12/05/2022   11:38 AM 12/05/2022    9:28 AM 11/27/2022    8:28 AM  BP/Weight  Systolic BP 144 818 563  Diastolic BP 80 90 149  Wt. (Lbs)  288 292  BMI  47.93 kg/m2 48.59 kg/m2    Physical Exam Vitals reviewed.  Constitutional:      Appearance: Normal appearance.  Neck:     Vascular: No carotid bruit.  Cardiovascular:     Rate and Rhythm: Normal rate and regular rhythm.     Heart sounds: Normal heart sounds.  Pulmonary:     Effort: Pulmonary effort is normal.     Breath sounds: Normal breath sounds.  Abdominal:     General: Bowel sounds are normal.     Palpations: Abdomen is soft.     Tenderness: There is no abdominal tenderness.  Neurological:     Mental Status: She is alert and oriented to person, place, and  time.  Psychiatric:        Mood and Affect: Mood normal.        Behavior: Behavior normal.    Lab Results  Component Value Date   WBC 8.5 11/18/2022   HGB 13.6 11/18/2022   HCT 44.4 11/18/2022   PLT 374 11/18/2022   GLUCOSE 105 (H) 11/18/2022   CHOL 143 12/05/2022   TRIG 53 12/05/2022   HDL  45 12/05/2022   LDLCALC 87 12/05/2022   ALT 16 11/18/2022   AST 14 11/18/2022   NA 144 11/18/2022   K 3.9 11/18/2022   CL 106 11/18/2022   CREATININE 1.02 (H) 11/18/2022   BUN 15 11/18/2022   CO2 19 (L) 11/18/2022   TSH 1.880 11/18/2022   HGBA1C 6.6 (H) 11/18/2022   MICROALBUR 10 09/03/2021   Assessment & Plan:   Problem List Items Addressed This Visit       Cardiovascular and Mediastinum   Essential hypertension - Primary    Discontinued Lasartan 50 mg Discontinued Hydrochocholorthizide 12.'5mg'$  Continue Amlodipine 5 mg Start Diovan-HCT 320-25 Continue keeping log book and follow up if BP not under controlled  Nutrition: Stressed importance of moderation in sodium intake, saturated fat and cholesterol, caloric balance, sufficient intake of complex carbohydrates, fiber, calcium and iron.   Exercise: Stressed the importance of regular exercise.         Relevant Medications   valsartan-hydrochlorothiazide (DIOVAN-HCT) 320-25 MG tablet   rosuvastatin (CRESTOR) 5 MG tablet   Chronic diastolic heart failure (HCC)    Following up with Cardiologist.      Relevant Medications   valsartan-hydrochlorothiazide (DIOVAN-HCT) 320-25 MG tablet   rosuvastatin (CRESTOR) 5 MG tablet     Endocrine   Dyslipidemia associated with type 2 diabetes mellitus (HCC)    Crestor 5 mg added and asked to take it CO-Q10 '100mg'$  if she has muscle pain  Nutrition: Stressed importance of moderation in sodium intake, saturated fat and cholesterol, caloric balance, sufficient intake of complex carbohydrates, fiber, calcium and iron.   Exercise: Stressed the importance of regular exercise.         Relevant  Medications   valsartan-hydrochlorothiazide (DIOVAN-HCT) 320-25 MG tablet   rosuvastatin (CRESTOR) 5 MG tablet     Follow-up: in 4 months if in case BP not controlled pleases call to office and make an appointment sooner I, Keidrick Murty have reviewed all documentation for this visit. The documentation on 12/19/22   for the exam, diagnosis, procedures, and orders are all accurate and complete.     An After Visit Summary was printed and given to the patient.  [name], DNP, [place] [phone]

## 2022-12-19 NOTE — Assessment & Plan Note (Signed)
Following up with Cardiologist.

## 2022-12-19 NOTE — Progress Notes (Deleted)
Subjective:  Patient ID: Virginia Barron, female    DOB: September 30, 1961  Age: 62 y.o. MRN: [id number]  Chief Complaints:  History of Present illness:   HPI: Patient came to the clinic for regular follow up, She has noticed High BP at home, usually 180/110. For HYPERTENSION, BP not under controlled Patient is taking only amlodipine 5 mg OD,  Clinic BP 138/90 first and 140/80 second reading Not taking HYDROCHLOROTHIAZIDE and COREG which was prescribed by cardiologist (Dr.Tobb) because of financial issues She said she will follow up with cardiology      Current Outpatient Medications on File Prior to Visit  Medication Sig Dispense Refill   amLODipine (NORVASC) 10 MG tablet Take 0.5 tablets (5 mg total) by mouth daily. 90 tablet 3   Blood Pressure Monitoring (SPHYGMOMANOMETER) MISC 1 each by Does not apply route in the morning and at bedtime. 1 each 0   cholecalciferol (VITAMIN D3) 25 MCG (1000 UNIT) tablet Take 400 Units by mouth daily.      escitalopram (LEXAPRO) 10 MG tablet Take 10 mg by mouth daily.     hydrochlorothiazide (MICROZIDE) 12.5 MG capsule Take 12.5 mg by mouth daily.     losartan (COZAAR) 50 MG tablet Take 50 mg by mouth daily.     metFORMIN (GLUCOPHAGE) 500 MG tablet Take 1 tablet (500 mg total) by mouth daily with breakfast. 90 tablet 1   Multiple Vitamin (MULTIVITAMIN) tablet Take 1 tablet by mouth daily.     Current Facility-Administered Medications on File Prior to Visit  Medication Dose Route Frequency Provider Last Rate Last Admin   triamcinolone acetonide (KENALOG-40) injection 80 mg  80 mg Intra-articular Once [name], MD       Past Medical History:  Diagnosis Date   Chronic diastolic heart failure ([place]) 11/18/2022   Dyslipidemia associated with type 2 diabetes mellitus ([place]) 09/09/2021   Essential hypertension 08/15/2020   Fibroids    Morbid obesity with body mass index (BMI) of 45.0 to 49.9 in adult [facility]) 07/30/2020   Morbid obesity with body mass  index (BMI) of 50.0 to 59.9 in adult ([place]) 07/30/2020   OSA (obstructive sleep apnea) 07/30/2020   Primary osteoarthritis of left knee 11/18/2022   Urge incontinence 07/30/2020   Past Surgical History:  Procedure Laterality Date   ABDOMINAL HYSTERECTOMY     precancerous cervical cells and fibroids.   conescopy     abnormal pap   LASIK     TUBAL LIGATION      Family History  Problem Relation Age of Onset   Diabetes Mother    Diabetes Father    Heart attack Father    Diabetes Sister    Breast cancer Neg Hx    Social History   Socioeconomic History   Marital status: Divorced    Spouse name: Not on file   Number of children: Not on file   Years of education: Not on file   Highest education level: Not on file  Occupational History   Not on file  Tobacco Use   Smoking status: Former    Types: Cigarettes    Quit date: 1980    Years since quitting: 44.0   Smokeless tobacco: Never  Substance and Sexual Activity   Alcohol use: Yes    Alcohol/week: 1.0 standard drink of alcohol    Types: 1 Standard drinks or equivalent per week    Comment: per month   Drug use: Never   Sexual activity: Not on file  Other Topics  Concern   Not on file  Social History Narrative   Not on file   Social Determinants of Health   Financial Resource Strain: Medium Risk (11/20/2022)   Overall Financial Resource Strain (CARDIA)    Difficulty of Paying Living Expenses: Somewhat hard  Food Insecurity: Food Insecurity Present (11/20/2022)   Hunger Vital Sign    Worried About Running Out of Food in the Last Year: Sometimes true    Ran Out of Food in the Last Year: Sometimes true  Transportation Needs: No Transportation Needs (12/12/2022)   PRAPARE - Hydrologist (Medical): No    Lack of Transportation (Non-Medical): No  Physical Activity: Inactive (11/24/2022)   Exercise Vital Sign    Days of Exercise per Week: 0 days    Minutes of Exercise per Session: 0 min   Stress: Stress Concern Present (12/16/2022)   [place]    Feeling of Stress : To some extent  Social Connections: Not on file    Review of Systems  Constitutional:  Negative for chills and fatigue.  HENT:  Negative for congestion, ear pain, sinus pain and sore throat.   Respiratory:  Negative for cough and shortness of breath.   Cardiovascular:  Negative for chest pain and leg swelling.  Gastrointestinal:  Negative for abdominal pain, constipation, diarrhea, nausea and vomiting.  Genitourinary:  Negative for dysuria and frequency.  Musculoskeletal:  Negative for arthralgias, back pain and myalgias.  Neurological:  Positive for headaches.     Objective:  BP 130/80   Pulse 74   Temp (!) 97.3 F (36.3 C)   Resp 18   Ht '5\' 5"'$  (1.651 m)   Wt 287 lb (130.2 kg)   SpO2 96%   BMI 47.76 kg/m      12/19/2022    7:34 AM 12/05/2022   11:38 AM 12/05/2022    9:28 AM  BP/Weight  Systolic BP 644 034 742  Diastolic BP 80 80 90  Wt. (Lbs) 287  288  BMI 47.76 kg/m2  47.93 kg/m2    Physical Exam Vitals reviewed.  Constitutional:      Appearance: Normal appearance. She is normal weight.  Neck:     Vascular: No carotid bruit.  Cardiovascular:     Rate and Rhythm: Normal rate and regular rhythm.     Heart sounds: Normal heart sounds.  Pulmonary:     Effort: Pulmonary effort is normal.     Breath sounds: Normal breath sounds.  Abdominal:     General: Abdomen is flat. Bowel sounds are normal.     Palpations: Abdomen is soft.     Tenderness: There is no abdominal tenderness.  Musculoskeletal:     Cervical back: Normal range of motion.  Neurological:     Mental Status: She is alert and oriented to person, place, and time.  Psychiatric:        Mood and Affect: Mood normal.        Behavior: Behavior normal.     Diabetic Foot Exam - Simple   No data filed      Lab Results  Component Value Date   WBC 8.5  11/18/2022   HGB 13.6 11/18/2022   HCT 44.4 11/18/2022   PLT 374 11/18/2022   GLUCOSE 105 (H) 11/18/2022   CHOL 143 12/05/2022   TRIG 53 12/05/2022   HDL 45 12/05/2022   LDLCALC 87 12/05/2022   ALT 16 11/18/2022   AST 14  11/18/2022   NA 144 11/18/2022   K 3.9 11/18/2022   CL 106 11/18/2022   CREATININE 1.02 (H) 11/18/2022   BUN 15 11/18/2022   CO2 19 (L) 11/18/2022   TSH 1.880 11/18/2022   HGBA1C 6.6 (H) 11/18/2022   MICROALBUR 10 09/03/2021      Assessment & Plan:    Follow-up: No follow-ups on file.  An After Visit Summary was printed and given to the patient.  [name], DNP, [place] [phone]

## 2022-12-23 ENCOUNTER — Other Ambulatory Visit: Payer: Medicaid Other

## 2022-12-23 NOTE — Patient Instructions (Signed)
Visit Information  Ms. Balbuena was given information about Medicaid Managed Care team care coordination services as a part of their Healthy [facility] Medicaid benefit. Kameren Captain verbally consented to engagement with the [facility] Managed Care team.   If you are experiencing a medical emergency, please call 911 or report to your local emergency department or urgent care.   If you have a non-emergency medical problem during routine business hours, please contact your provider's office and ask to speak with a nurse.   For questions related to your Healthy [facility] health plan, please call: [phone] or visit the homepage here: [email or web address]  If you would like to schedule transportation through your Healthy [facility] plan, please call the following number at least 2 days in advance of your appointment: [phone]  For information about your ride after you set it up, call Ride Assist at [phone]. Use this number to activate a Will Call pickup, or if your transportation is late for a scheduled pickup. Use this number, too, if you need to make a change or cancel a previously scheduled reservation.  If you need transportation services right away, call [phone]. The after-hours call center is staffed 24 hours to handle ride assistance and urgent reservation requests (including discharges) 365 days a year. Urgent trips include sick visits, hospital discharge requests and life-sustaining treatment.  Call the [place] at [phone], at any time, 24 hours a day, 7 days a week. If you are in danger or need immediate medical attention call 911.  If you would like help to quit smoking, call 1-800-QUIT-NOW 513-039-8119) OR Espaol: 1-855-Djelo-Ya (3-151-761-6073) o para ms informacin haga clic aqu or Text READY to 200-400 to register via text  Ms. Porath - following are the goals we discussed in your visit today:    Goals Addressed   None      Social Worker will follow up on 01/23/23.   Mickel Fuchs, BSW, [place]  High Risk Managed Medicaid Team  [phone]   Following is a copy of your plan of care:  Care Plan : [place] of Care  Updates made by Ethelda Chick since 12/23/2022 12:00 AM     Problem: Health Management needs related to CHF and DMII      Long-Range Goal: Development of Plan of Care to address Health Management needs related to CHF and DMII   Start Date: 11/26/2022  Expected End Date: 03/26/2023  Note:   Current Barriers:  Chronic Disease Management support and education needs related to CHF and DMII  Patient seen by PCP and Cardiology. Now taking BP and DM medications. She received new CPAP yesterday and will start using. She request CM services for her adult son.    RNCM Clinical Goal(s):  Patient will verbalize understanding of plan for management of CHF and DMII as evidenced by patient reports take all medications exactly as prescribed and will call provider for medication related questions as evidenced by patient reports and EMR documentation    attend all scheduled medical appointments: 11/27/22 with Cardiology and 12/05/22 with PCP as evidenced by provider documentation        continue to work with [place] and/or Social Worker to address care management and care coordination needs related to CHF and DMII as evidenced by adherence to CM Team Scheduled appointments       Interventions: Inter-disciplinary care team collaboration (see longitudinal plan of care) Evaluation of current treatment plan  related to  self management and patient's adherence to plan as established by provider Advised patient to discuss needed referrals with PCP on 12/19/22(foot exam and Colon Cancer Screening) Advised patient to contact Healthy [facility] [phone] for Member Benefits BSW completed a telephone outreach with  patient, she stated she did receive the resources BSW sent to her. Patient did ask for BSW to locate an apartment complex in [place], after researching BSW was unable to locate the apartment complex patient was looking for. Patient stated no other resources are needed at this time.   Hypertension Interventions:  (Status:  New goal.) Long Term Goal Last practice recorded BP readings:  BP Readings from Last 3 Encounters:  12/05/22 (!) 140/80  11/27/22 (!) 178/116  11/18/22 116/76  Most recent eGFR/CrCl:  Lab Results  Component Value Date   EGFR 62 11/18/2022    No components found for: "CRCL"  Evaluation of current treatment plan related to hypertension self management and patient's adherence to plan as established by provider Provided education to patient re: stroke prevention, s/s of heart attack and stroke Reviewed medications with patient and discussed importance of compliance Advised patient, providing education and rationale, to monitor blood pressure daily and record, calling PCP for findings outside established parameters Reviewed scheduled/upcoming provider appointments including:  Reviewed home readings, last recorded BP 143/88  Heart Failure Interventions:  (Status: Goal on Track (progressing): YES.)  Long Term Goal  Wt Readings from Last 3 Encounters:  11/18/22 292 lb (132.5 kg)  09/03/21 294 lb (133.4 kg)  08/28/21 295 lb 6.4 oz (134 kg)   Provided education on low sodium diet Discussed the importance of keeping all appointments with provider Provided patient with education about the role of exercise in the management of heart failure Assessed social determinant of health barriers Ensured patient received CPAP-received yesterday, has not started using Advised patient to express any concerns, questions or changes in her health with provider  Diabetes:  (Status: Goal on Track (progressing): YES.) Long Term Goal   Lab Results  Component Value Date   HGBA1C 6.6 (H)  11/18/2022   @ Assessed patient's understanding of A1c goal: <7% Reviewed medications with patient and discussed importance of medication adherence;        Counseled on importance of regular laboratory monitoring as prescribed;        Reviewed scheduled/upcoming provider appointments including: 12/16/22 with LCSW and MM Pharmacist, 12/18/22 for Mammogram, 12/19/22 with PCP and 12/23/22 with BSW;         Review of patient status, including review of consultants reports, relevant laboratory and other test results, and medications completed;       Assessed social determinant of health barriers;        Provided education regarding Diabetic Management via MyChart  Patient Goals/Self-Care Activities: Take medications as prescribed   Attend all scheduled provider appointments Call provider office for new concerns or questions  Work with the social worker to address care coordination needs and will continue to work with the clinical team to address health care and disease management related needs use salt in moderation watch for swelling in feet, ankles and legs every day eat more whole grains, fruits and vegetables, lean meats and healthy fats dress right for the weather, hot or cold keep appointment with eye doctor check feet daily for cuts, sores or redness drink 6 to 8 glasses of water each day fill half of plate with vegetables switch to sugar-free drinks

## 2022-12-23 NOTE — Patient Outreach (Signed)
Medicaid Managed Care Social Work Note  04/03/8340 Name:  Virginia Barron MRN:  [id number] DOB:  [birth year]  Virginia Barron is an 62 y.o. year old female who is a primary patient of [name], [name], MD.  The Medicaid Managed Care Coordination team was consulted for assistance with:  [employer]   Virginia Barron was given information about Medicaid Managed Care Coordination team services today. Virginia Barron Patient agreed to services and verbal consent obtained.  Engaged with patient  for by telephone forfollow up visit in response to referral for case management and/or care coordination services.   Assessments/Interventions:  Review of past medical history, allergies, medications, health status, including review of consultants reports, laboratory and other test data, was performed as part of comprehensive evaluation and provision of chronic care management services.  SDOH: (Social Determinant of Health) assessments and interventions performed: SDOH Interventions    Flowsheet Row Patient Outreach Telephone from 12/16/2022 in [place] Patient Outreach Telephone from 12/12/2022 in [place] Patient Outreach Telephone from 12/04/2022 in [place] Patient Outreach Telephone from 11/26/2022 in [place] Patient Outreach Telephone from 11/21/2022 in [place]  SDOH Interventions       Transportation Interventions -- Intervention Not Indicated -- -- --  Alcohol Usage Interventions -- -- -- Intervention Not Indicated (Score <7) --  Depression Interventions/Treatment  -- -- -- -- Referral to Psychiatry  Stress Interventions Provide Counseling, Offered [employer], Other (Comment)  [Starting services with [place], Provide Counseling -- Provide Counseling, [place] completed a telephone outreach with patient, she stated she did receive the resources BSW sent to her. Patient did ask for BSW to locate an apartment complex in [place], after researching BSW was unable to locate the apartment complex patient was looking for. Patient stated no other resources are needed at this time.   Advanced Directives Status:  Not addressed in this encounter.  Care Plan                 Allergies  Allergen Reactions   Avocado    Iodine Hives, Photosensitivity and Swelling   Kiwi Extract    Penicillins    Pork-Derived Products    Shellfish Allergy    Latex Hives, Itching, Rash and Swelling    Medications Reviewed Today     Reviewed by [name], FNP (Family Nurse Practitioner) on 12/19/22 at 0755  Med List Status: <None>   Medication Order Taking? Sig Documenting Provider Last Dose Status Informant  amLODipine (NORVASC) 10 MG tablet [id number] Yes Take 0.5 tablets (5 mg total) by mouth daily. Revankar, Reita Cliche, MD Taking Active   Blood Pressure Monitoring [facility] [place] [id number] Yes 1 each by Does not apply route in the morning and at bedtime. [name], FNP Taking Active   cholecalciferol (VITAMIN D3) 25 MCG (1000 UNIT) tablet [id number] Yes Take 400 Units by mouth daily.  [provider] Taking Active   escitalopram (LEXAPRO) 10 MG tablet [id number] Yes Take 10 mg by mouth daily. [provider] Taking Active   metFORMIN (GLUCOPHAGE) 500 MG tablet [id number] Yes Take 1 tablet (500 mg total) by mouth daily with breakfast. [name], FNP Taking Active   Multiple Vitamin (MULTIVITAMIN) tablet [id number] Yes Take 1 tablet by mouth daily. [provider] Taking Active   rosuvastatin (CRESTOR) 5 MG tablet [id number] Yes Take 1 tablet (  5 mg total) by mouth daily. [name], FNP  Active   triamcinolone acetonide (KENALOG-40) injection 80 mg [id number]   [name], MD  Active   valsartan-hydrochlorothiazide (DIOVAN-HCT) 320-25  MG tablet [id number] Yes Take 1 tablet by mouth daily. [name], FNP  Active             Patient Active Problem List   Diagnosis Date Noted   Type 2 diabetes mellitus without complication, without long-term current use of insulin ([place]) 12/05/2022   Encounter for screening mammogram for malignant neoplasm of breast 12/05/2022   Morbid obesity with body mass index (BMI) of 50.0 to 59.9 in adult ([place]) 11/26/2022   Primary osteoarthritis of left knee 11/18/2022   Chronic diastolic heart failure ([place]) 11/18/2022   Dyslipidemia associated with type 2 diabetes mellitus ([place]) 09/09/2021   Essential hypertension 08/15/2020   Fibroids    Urge incontinence 07/30/2020   OSA (obstructive sleep apnea) 07/30/2020   Morbid obesity with body mass index (BMI) of 45.0 to 49.9 in adult [facility]) 07/30/2020    Conditions to be addressed/monitored per PCP order:   community resources  Care Plan : RN Care Manager Plan of Care  Updates made by Ethelda Chick since 12/23/2022 12:00 AM     Problem: Health Management needs related to CHF and DMII      Long-Range Goal: Development of Plan of Care to address Health Management needs related to CHF and DMII   Start Date: 11/26/2022  Expected End Date: 03/26/2023  Note:   Current Barriers:  Chronic Disease Management support and education needs related to CHF and DMII  Patient seen by PCP and Cardiology. Now taking BP and DM medications. She received new CPAP yesterday and will start using. She request CM services for her adult son.    RNCM Clinical Goal(s):  Patient will verbalize understanding of plan for management of CHF and DMII as evidenced by patient reports take all medications exactly as prescribed and will call provider for medication related questions as evidenced by patient reports and EMR documentation    attend all scheduled medical appointments: 11/27/22 with Cardiology and 12/05/22 with PCP as evidenced by provider documentation         continue to work with [place] and/or Social Worker to address care management and care coordination needs related to CHF and DMII as evidenced by adherence to CM Team Scheduled appointments       Interventions: Inter-disciplinary care team collaboration (see longitudinal plan of care) Evaluation of current treatment plan related to  self management and patient's adherence to plan as established by provider Advised patient to discuss needed referrals with PCP on 12/19/22(foot exam and Colon Cancer Screening) Advised patient to contact [employer] [phone] for Member Benefits BSW completed a telephone outreach with patient, she stated she did receive the resources BSW sent to her. Patient did ask for BSW to locate an apartment complex in [place], after researching BSW was unable to locate the apartment complex patient was looking for. Patient stated no other resources are needed at this time.   Hypertension Interventions:  (Status:  New goal.) Long Term Goal Last practice recorded BP readings:  BP Readings from Last 3 Encounters:  12/05/22 (!) 140/80  11/27/22 (!) 178/116  11/18/22 116/76  Most recent eGFR/CrCl:  Lab Results  Component Value Date   EGFR 62 11/18/2022    No components found for: "CRCL"  Evaluation of current treatment plan related to hypertension self management  and patient's adherence to plan as established by provider Provided education to patient re: stroke prevention, s/s of heart attack and stroke Reviewed medications with patient and discussed importance of compliance Advised patient, providing education and rationale, to monitor blood pressure daily and record, calling PCP for findings outside established parameters Reviewed scheduled/upcoming provider appointments including:  Reviewed home readings, last recorded BP 143/88  Heart Failure Interventions:  (Status: Goal on Track (progressing): YES.)  Long Term Goal  Wt Readings from  Last 3 Encounters:  11/18/22 292 lb (132.5 kg)  09/03/21 294 lb (133.4 kg)  08/28/21 295 lb 6.4 oz (134 kg)   Provided education on low sodium diet Discussed the importance of keeping all appointments with provider Provided patient with education about the role of exercise in the management of heart failure Assessed social determinant of health barriers Ensured patient received CPAP-received yesterday, has not started using Advised patient to express any concerns, questions or changes in her health with provider  Diabetes:  (Status: Goal on Track (progressing): YES.) Long Term Goal   Lab Results  Component Value Date   HGBA1C 6.6 (H) 11/18/2022   @ Assessed patient's understanding of A1c goal: <7% Reviewed medications with patient and discussed importance of medication adherence;        Counseled on importance of regular laboratory monitoring as prescribed;        Reviewed scheduled/upcoming provider appointments including: 12/16/22 with LCSW and MM Pharmacist, 12/18/22 for Mammogram, 12/19/22 with PCP and 12/23/22 with BSW;         Review of patient status, including review of consultants reports, relevant laboratory and other test results, and medications completed;       Assessed social determinant of health barriers;        Provided education regarding Diabetic Management via MyChart  Patient Goals/Self-Care Activities: Take medications as prescribed   Attend all scheduled provider appointments Call provider office for new concerns or questions  Work with the social worker to address care coordination needs and will continue to work with the clinical team to address health care and disease management related needs use salt in moderation watch for swelling in feet, ankles and legs every day eat more whole grains, fruits and vegetables, lean meats and healthy fats dress right for the weather, hot or cold keep appointment with eye doctor check feet daily for cuts, sores or  redness drink 6 to 8 glasses of water each day fill half of plate with vegetables switch to sugar-free drinks       Follow up:  Patient agrees to Care Plan and Follow-up.  Plan: The Managed Medicaid care management team will reach out to the patient again over the next 30 days.  Date/time of next scheduled Social Work care management/care coordination outreach:  01/23/23  Mickel Fuchs, Arita Miss, [place] Medicaid Team  [phone]

## 2022-12-26 DIAGNOSIS — F39 Unspecified mood [affective] disorder: Secondary | ICD-10-CM | POA: Diagnosis not present

## 2023-01-06 ENCOUNTER — Other Ambulatory Visit: Payer: Medicaid Other | Admitting: Licensed Clinical Social Worker

## 2023-01-06 DIAGNOSIS — G4733 Obstructive sleep apnea (adult) (pediatric): Secondary | ICD-10-CM | POA: Diagnosis not present

## 2023-01-06 NOTE — Patient Outreach (Signed)
Medicaid Managed Care Social Work Note  9/62/2297 Name:  Jossalyn Ehrmann MRN:  [id number] DOB:  [birth year]  Shriley Laufer is an 62 y.o. year old female who is a primary patient of [name], Kirsten, MD.  The Medicaid Managed Care Coordination team was consulted for assistance with:  [place] and Resources  Ms. Mates was given information about Medicaid Managed Care Coordination team services today. Myrtie Fahy Patient agreed to services and verbal consent obtained.  Engaged with patient  for by telephone forfollow up visit in response to referral for case management and/or care coordination services.   Assessments/Interventions:  Review of past medical history, allergies, medications, health status, including review of consultants reports, laboratory and other test data, was performed as part of comprehensive evaluation and provision of chronic care management services.  SDOH: (Social Determinant of Health) assessments and interventions performed: SDOH Interventions    Flowsheet Row Patient Outreach Telephone from 01/06/2023 in [place] Patient Outreach Telephone from 12/16/2022 in [place] Patient Outreach Telephone from 12/12/2022 in [place] Patient Outreach Telephone from 12/04/2022 in [place] Patient Outreach Telephone from 11/26/2022 in [place] Patient Outreach Telephone from 11/21/2022 in [place]  SDOH Interventions        Transportation Interventions -- -- Intervention Not Indicated -- -- --  Alcohol Usage Interventions -- -- -- -- Intervention Not Indicated (Score <7) --  Depression Interventions/Treatment  -- -- -- -- -- Referral to Psychiatry  Stress Interventions Offered [employer], Provide Counseling Provide Counseling, Offered [employer],  Other (Comment)  [Starting services with [facility], Provide Counseling -- Provide Counseling, [employer] [facility]       Advanced Directives Status:  See Care Plan for related entries.  Care Plan                 Allergies  Allergen Reactions   Avocado    Iodine Hives, Photosensitivity and Swelling   Kiwi Extract    Penicillins    Pork-Derived Products    Shellfish Allergy    Latex Hives, Itching, Rash and Swelling    Medications Reviewed Today     Reviewed by [name], FNP (Family Nurse Practitioner) on 12/19/22 at 0755  Med List Status: <None>   Medication Order Taking? Sig Documenting Provider Last Dose Status Informant  amLODipine (NORVASC) 10 MG tablet [id number] Yes Take 0.5 tablets (5 mg total) by mouth daily. Revankar, Reita Cliche, MD Taking Active   Blood Pressure Monitoring [facility] [place] [id number] Yes 1 each by Does not apply route in the morning and at bedtime. [name], FNP Taking Active   cholecalciferol (VITAMIN D3) 25 MCG (1000 UNIT) tablet [id number] Yes Take 400 Units by mouth daily.  [provider] Taking Active   escitalopram (LEXAPRO) 10 MG tablet [id number] Yes Take 10 mg by mouth daily. [provider] Taking Active   metFORMIN (GLUCOPHAGE) 500 MG tablet [id number] Yes Take 1 tablet (500 mg total) by mouth daily with breakfast. [name], FNP Taking Active   Multiple Vitamin (MULTIVITAMIN) tablet [id number] Yes Take 1 tablet by mouth daily. [provider] Taking Active   rosuvastatin (CRESTOR) 5 MG tablet [id number] Yes Take 1 tablet (5 mg total) by mouth daily. [name], FNP  Active   triamcinolone acetonide (KENALOG-40) injection 80 mg [id number]   [name], MD  Active   valsartan-hydrochlorothiazide (  DIOVAN-HCT) 320-25 MG tablet [id number] Yes Take 1 tablet by mouth daily. [name], FNP  Active             Patient Active Problem List   Diagnosis Date Noted    Type 2 diabetes mellitus without complication, without long-term current use of insulin ([place]) 12/05/2022   Encounter for screening mammogram for malignant neoplasm of breast 12/05/2022   Morbid obesity with body mass index (BMI) of 50.0 to 59.9 in adult ([place]) 11/26/2022   Primary osteoarthritis of left knee 11/18/2022   Chronic diastolic heart failure ([place]) 11/18/2022   Dyslipidemia associated with type 2 diabetes mellitus ([place]) 09/09/2021   Essential hypertension 08/15/2020   Fibroids    Urge incontinence 07/30/2020   OSA (obstructive sleep apnea) 07/30/2020   Morbid obesity with body mass index (BMI) of 45.0 to 49.9 in adult [facility]) 07/30/2020    Conditions to be addressed/monitored per PCP order:  Depression  Care Plan : LCSW Plan of Care  Updates made by Greg Cutter, LCSW since 01/06/2023 12:00 AM     Problem: Depression Identification (Depression)      Long-Range Goal: Depressive Symptoms Identified   Start Date: 11/21/2022  Note:   Priority: High  Timeframe:  Long-Range Goal Priority:  High Start Date:   11/21/22            Expected End Date:  ongoing                     Follow Up Date--01/24/23 at 38 am  - keep 90 percent of scheduled appointments -consider counseling or psychiatry -consider bumping up your self-care  -consider creating a stronger support network   Why is this important?             Combatting depression may take some time.            If you don't feel better right away, don't give up on your treatment plan.    Current barriers:   Chronic Mental Health needs related to depression and stress. Patient requires Support, Education, Resources, Referrals, Advocacy, and Care Coordination, in order to meet Unmet Mental Health Needs. Limited support network Patient will implement clinical interventions discussed today to decrease symptoms of depression and increase knowledge and/or ability of: coping skills. Mental Health Concerns and Social  Isolation Patient lacks knowledge of available community counseling agencies and resources.  Clinical Goal(s): verbalize understanding of plan for management of Depression, and Stress and demonstrate a reduction in symptoms. Patient will connect with a provider for ongoing mental health treatment, increase coping skills, healthy habits, self-management skills, and stress reduction        Clinical Interventions:  Assessed patient's previous and current treatment, coping skills, support system and barriers to care. Patient provided hx  Verbalization of feelings encouraged, motivational interviewing employed Emotional support provided, positive coping strategies explored. Establishing healthy boundaries emphasized and healthy self-care education provided Patient was educated on available mental health resources within their area that accept Medicaid and offer counseling and psychiatry. Patient reports significant worsening depression and stress impacting her ability to function appropriately and carry out daily task. Patient reports never having mental health support. 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 She reports that she has a close friend [name] that lives in [place] that she talks to on the phone to gain socialization.  Patient [facility] agreeable [facility]. Patient prefers virtual counseling.  [facility] LCSW made referral on 11/21/22. UPDATE- Patient reports that she was able to establish contact with agency but she thinks her initial appointment with them was only with a psychiatrist but she will look through her paperwork and double check. Patient reports interest in gaining counseling as well. Email will be sent to [facility] if needed during next social work outreach. UPDATE- Patient has been enrolled in [place] program. She has started psychiatry services only and will consider implementing counseling services as well. [employer] LCSW provided  education on basic self-care that she can use daily to prevent winter sickness and promote positive physical and mental health. 01/06/23 LCSW update- Patient reports that her next psychiatrist appointment with [facility] is on 01/23/23. She reports that she remains on a wait list for counseling but does not wish for [facility] LCSW to check her status on this as she will question this herself during her next mental health appointment with [place]. [facility] LCSW provided brief emotional support and self-care education.  LCSW provided education on relaxation techniques such as meditation, deep breathing, massage, grounding exercises or yoga that can activate the body's relaxation response and ease symptoms of stress and anxiety. LCSW ask that when pt is struggling with difficult emotions and racing thoughts that they start this relaxation response process. LCSW provided extensive education on healthy coping skills for anxiety. SW used active and reflective listening, validated patient's feelings/concerns, and provided emotional support. Patient will work on implementing appropriate self-care habits into their daily routine such as: staying positive, writing a gratitude list, drinking water, staying active around the house, taking their medications as prescribed, combating negative thoughts or emotions and staying connected with their family and friends. Positive reinforcement provided for this decision to work on this. Motivational Interviewing employed Depression screen reviewed  PHQ2/ PHQ9 completed or reviewed  Mindfulness or Relaxation training provided Active listening / Reflection utilized  Advance Care and HCPOA education provided Emotional Support Provided Problem Portal strategies reviewed Provided psychoeducation for mental health needs  Provided brief CBT  Reviewed mental health medications and discussed importance of compliance:  Quality of sleep assessed & Sleep Hygiene techniques promoted   Participation in counseling encouraged  Verbalization of feelings encouraged  Suicidal Ideation/Homicidal Ideation assessed: Patient denies SI/HI  Review resources, discussed options and provided patient information about  [place] care team collaboration (see longitudinal plan of care) Patient Goals/Self-Care Activities: Over the next 120 days Attend scheduled medical appointments Utilize healthy coping skills and supportive resources discussed Contact PCP with any questions or concerns Keep 90 percent of counseling appointments Call your insurance provider for more information about your Enhanced Benefits  Check out counseling resources provided  Begin personal counseling with LCSW, to reduce and manage symptoms of Depression and Stress, until well-established with mental health provider Accept all calls from representative with [facility] [place] in an effort to establish ongoing mental health counseling and supportive services. Incorporate into daily practice - relaxation techniques, deep breathing exercises, and mindfulness meditation strategies. Talk about feelings with friends, family members, spiritual advisor, etc. Contact LCSW directly [phone]), if you have questions, need assistance, or if additional social work needs are identified between now and our next scheduled telephone outreach call. Call [phone] for mental health hotline/crisis line if needed (24/7 available) Try techniques to reduce symptoms of anxiety/negative thinking (deep breathing, distraction, positive self talk, etc)  - develop a personal safety plan - develop a plan to deal with triggers like holidays, anniversaries - exercise  at least 2 to 3 times per week - have a plan for how to handle bad days - journal feelings and what helps to feel better or worse - spend time or talk with others at least 2 to 3 times per week - watch for early signs of feeling worse -  begin personal counseling - call and visit an old friend - check out volunteer opportunities - join a support group - laugh; watch a funny movie or comedian - learn and use visualization or guided imagery - perform a random act of kindness - practice relaxation or meditation daily - start or continue a personal journal - practice positive thinking and self-talk -continue with compliance of taking medication  -identify current effective and ineffective coping strategies.  -implement positive self-talk in care to increase self-esteem, confidence and feelings of control.  -consider alternative and complementary therapy approaches such as meditation, mindfulness or yoga.  -journaling, prayer, worship services, meditation or pastoral counseling.  -increase participation in pleasurable group activities such as hobbies, singing, sports or volunteering).  -consider the use of meditative movement therapy such as tai chi, yoga or qigong.  -start a regular daily exercise program based on tolerance, ability and patient choice to support positive thinking and activity       11/21/2022   10:07 AM 11/18/2022    3:42 PM 04/08/2021    7:58 PM 10/23/2020   12:08 PM  Depression screen PHQ 2/9  Decreased Interest 1 1 0 0  Down, Depressed, Hopeless 1 0 0 2  PHQ - 2 Score 2 1 0 2  Altered sleeping 2 2  0  Tired, decreased energy '1 1  1  '$ Change in appetite 0 0  2  Feeling bad or failure about yourself  0 0  0  Trouble concentrating 1 1  0  Moving slowly or fidgety/restless 0 0    Suicidal thoughts 0 0  1  PHQ-9 Score '6 5  6  '$ Difficult doing work/chores Somewhat difficult Somewhat difficult  Somewhat difficult   10 LITTLE Things To Do When You're Feeling Too Down To Do Anything  Take a shower. Even if you plan to stay in all day long and not see a soul, take a shower. It takes the most effort to hop in to the shower but once you do, you'll feel immediate results. It will wake you up and you'll be  feeling much fresher (and cleaner too).  Brush and floss your teeth. Give your teeth a good brushing with a floss finish. It's a small task but it feels so good and you can check 'taking care of your health' off the list of things to do.  Do something small on your list. Most of [place] have some small thing we would like to get done (load of laundry, sew a button, email a friend). Doing one of these things will make you feel like you've accomplished something.  Drink water. Drinking water is easy right? It's also really beneficial for your health so keep a glass beside you all day and take sips often. It gives you energy and prevents you from boredom eating.  Do some floor exercises. The last thing you want to do is exercise but it might be just the thing you need the most. Keep it simple and do exercises that involve sitting or laying on the floor. Even the smallest of exercises release chemicals in the brain that make you feel good. Yoga stretches or core exercises are going  to make you feel good with minimal effort.  Make your bed. Making your bed takes a few minutes but it's productive and you'll feel relieved when it's done. An unmade bed is a huge visual reminder that you're having an unproductive day. Do it and consider it your housework for the day.  Put on some nice clothes. Take the sweatpants off even if you don't plan to go anywhere. Put on clothes that make you feel good. Take a look in the mirror so your brain recognizes the sweatpants have been replaced with clothes that make you look great. It's an instant confidence booster.  Wash the dishes. A pile of dirty dishes in the sink is a reflection of your mood. It's possible that if you wash up the dishes, your mood will follow suit. It's worth a try.  Cook a real meal. If you have the luxury to have a "do nothing" day, you have time to make a real meal for yourself. Make a meal that you love to eat. The process is good to get you out  of the funk and the food will ensure you have more energy for tomorrow.  Write out your thoughts by hand. When you hand write, you stimulate your brain to focus on the moment that you're in so make yourself comfortable and write whatever comes into your mind. Put those thoughts out on paper so they stop spinning around in your head. Those thoughts might be the very thing holding you down.  If you are experiencing a Mental Health or [place] or need someone to talk to, please call the Suicide and Crisis Lifeline: 988    Patient Goals: Follow up goal      Follow up:  Patient agrees to Care Plan and Follow-up.  Plan: The Managed Medicaid care management team will reach out to the patient again over the next 30 days.  Date/time of next scheduled Social Work care management/care coordination outreach:  01/24/23 at 4 am  Eula Fried, BSW, MSW, [place] Medicaid LCSW [email or web address] Phone: [phone]

## 2023-01-06 NOTE — Patient Instructions (Signed)
Visit Information  Virginia Barron was given information about Medicaid Managed Care team care coordination services as a part of their Healthy [facility] Medicaid benefit. Virginia Barron verbally consented to engagement with the [facility] Managed Care team.   If you are experiencing a medical emergency, please call 911 or report to your local emergency department or urgent care.   If you have a non-emergency medical problem during routine business hours, please contact your provider's office and ask to speak with a nurse.   For questions related to your Healthy [facility] health plan, please call: [phone] or visit the homepage here: [email or web address]  If you would like to schedule transportation through your Healthy [facility] plan, please call the following number at least 2 days in advance of your appointment: [phone]  For information about your ride after you set it up, call Ride Assist at [phone]. Use this number to activate a Will Call pickup, or if your transportation is late for a scheduled pickup. Use this number, too, if you need to make a change or cancel a previously scheduled reservation.  If you need transportation services right away, call [phone]. The after-hours call center is staffed 24 hours to handle ride assistance and urgent reservation requests (including discharges) 365 days a year. Urgent trips include sick visits, hospital discharge requests and life-sustaining treatment.  Call the [place] at [phone], at any time, 24 hours a day, 7 days a week. If you are in danger or need immediate medical attention call 911.  If you would like help to quit smoking, call 1-800-QUIT-NOW (984) 136-5292) OR Espaol: 1-855-Djelo-Ya (0-349-179-1505) o para ms informacin haga clic aqu or Text READY to 200-400 to register via text   Following is a copy of your plan of care:  Care Plan : LCSW Plan of Care   Updates made by Greg Cutter, LCSW since 01/06/2023 12:00 AM     Problem: Depression Identification (Depression)      Long-Range Goal: Depressive Symptoms Identified   Start Date: 11/21/2022  Note:   Priority: High  Timeframe:  Long-Range Goal Priority:  High Start Date:   11/21/22            Expected End Date:  ongoing                     Follow Up Date--01/24/23 at 1030 am  - keep 90 percent of scheduled appointments -consider counseling or psychiatry -consider bumping up your self-care  -consider creating a stronger support network   Why is this important?             Combatting depression may take some time.            If you don't feel better right away, don't give up on your treatment plan.    Current barriers:   Chronic Mental Health needs related to depression and stress. Patient requires Support, Education, Resources, Referrals, Advocacy, and Care Coordination, in order to meet Unmet Mental Health Needs. Limited support network Patient will implement clinical interventions discussed today to decrease symptoms of depression and increase knowledge and/or ability of: coping skills. Mental Health Concerns and Social Isolation Patient lacks knowledge of available community counseling agencies and resources.  Clinical Goal(s): verbalize understanding of plan for management of Depression, and Stress and demonstrate a reduction in symptoms. Patient will connect with a provider for ongoing mental health treatment, increase coping skills, healthy habits, self-management skills, and stress reduction  Patient Goals/Self-Care Activities: Over the next 120 days Attend scheduled medical appointments Utilize healthy coping skills and supportive resources discussed Contact PCP with any questions or concerns Keep 90 percent of counseling appointments Call your insurance provider for more information about your Enhanced Benefits  Check out counseling resources provided   Begin personal counseling with LCSW, to reduce and manage symptoms of Depression and Stress, until well-established with mental health provider Accept all calls from representative with [facility] [place] in an effort to establish ongoing mental health counseling and supportive services. Incorporate into daily practice - relaxation techniques, deep breathing exercises, and mindfulness meditation strategies. Talk about feelings with friends, family members, spiritual advisor, etc. Contact LCSW directly [phone]), if you have questions, need assistance, or if additional social work needs are identified between now and our next scheduled telephone outreach call. Call 988 for mental health hotline/crisis line if needed (24/7 available) Try techniques to reduce symptoms of anxiety/negative thinking (deep breathing, distraction, positive self talk, etc)  - develop a personal safety plan - develop a plan to deal with triggers like holidays, anniversaries - exercise at least 2 to 3 times per week - have a plan for how to handle bad days - journal feelings and what helps to feel better or worse - spend time or talk with others at least 2 to 3 times per week - watch for early signs of feeling worse - begin personal counseling - call and visit an old friend - check out volunteer opportunities - join a support group - laugh; watch a funny movie or comedian - learn and use visualization or guided imagery - perform a random act of kindness - practice relaxation or meditation daily - start or continue a personal journal - practice positive thinking and self-talk -continue with compliance of taking medication  -identify current effective and ineffective coping strategies.  -implement positive self-talk in care to increase self-esteem, confidence and feelings of control.  -consider alternative and complementary therapy approaches such as meditation, mindfulness or yoga.  -journaling,  prayer, worship services, meditation or pastoral counseling.  -increase participation in pleasurable group activities such as hobbies, singing, sports or volunteering).  -consider the use of meditative movement therapy such as tai chi, yoga or qigong.  -start a regular daily exercise program based on tolerance, ability and patient choice to support positive thinking and activity       11/21/2022   10:07 AM 11/18/2022    3:42 PM 04/08/2021    7:58 PM 10/23/2020   12:08 PM  Depression screen PHQ 2/9  Decreased Interest 1 1 0 0  Down, Depressed, Hopeless 1 0 0 2  PHQ - 2 Score 2 1 0 2  Altered sleeping 2 2  0  Tired, decreased energy _0 Change in appetite 0 0  2  Feeling bad or failure about yourself  0 0  0  Trouble concentrating 1 1  0  Moving slowly or fidgety/restless 0 0    Suicidal thoughts 0 0  1  PHQ-9 Score _1 Difficult doing work/chores Somewhat difficult Somewhat difficult  Somewhat difficult   10 LITTLE Things To Do When You're Feeling Too Down To Do Anything  Take a shower. Even if you plan to stay in all day long and not see a soul, take a shower. It takes the most effort to hop in to the shower but once you do, you'll feel immediate results. It will wake you up  and you'll be feeling much fresher (and cleaner too).  Brush and floss your teeth. Give your teeth a good brushing with a floss finish. It's a small task but it feels so good and you can check 'taking care of your health' off the list of things to do.  Do something small on your list. Most of [place] have some small thing we would like to get done (load of laundry, sew a button, email a friend). Doing one of these things will make you feel like you've accomplished something.  Drink water. Drinking water is easy right? It's also really beneficial for your health so keep a glass beside you all day and take sips often. It gives you energy and prevents you from boredom eating.  Do some floor exercises. The  last thing you want to do is exercise but it might be just the thing you need the most. Keep it simple and do exercises that involve sitting or laying on the floor. Even the smallest of exercises release chemicals in the brain that make you feel good. Yoga stretches or core exercises are going to make you feel good with minimal effort.  Make your bed. Making your bed takes a few minutes but it's productive and you'll feel relieved when it's done. An unmade bed is a huge visual reminder that you're having an unproductive day. Do it and consider it your housework for the day.  Put on some nice clothes. Take the sweatpants off even if you don't plan to go anywhere. Put on clothes that make you feel good. Take a look in the mirror so your brain recognizes the sweatpants have been replaced with clothes that make you look great. It's an instant confidence booster.  Wash the dishes. A pile of dirty dishes in the sink is a reflection of your mood. It's possible that if you wash up the dishes, your mood will follow suit. It's worth a try.  Cook a real meal. If you have the luxury to have a "do nothing" day, you have time to make a real meal for yourself. Make a meal that you love to eat. The process is good to get you out of the funk and the food will ensure you have more energy for tomorrow.  Write out your thoughts by hand. When you hand write, you stimulate your brain to focus on the moment that you're in so make yourself comfortable and write whatever comes into your mind. Put those thoughts out on paper so they stop spinning around in your head. Those thoughts might be the very thing holding you down.  If you are experiencing a Mental Health or [place] or need someone to talk to, please call the Suicide and Crisis Lifeline: 988    Patient Goals: Follow up goal

## 2023-01-14 ENCOUNTER — Other Ambulatory Visit: Payer: Medicaid Other | Admitting: Pharmacist

## 2023-01-14 NOTE — Progress Notes (Signed)
[id number] Name: Virginia Barron MRN: [id number] DOB: 1961/10/16  Chief Complaint  Patient presents with   Medication Management   Hypertension   Diabetes    Virginia Barron is a 62 y.o. year old female who presented for a telephone visit.   They were referred to the pharmacist by their PCP for assistance in managing hypertension and medication access.   Patient is participating in a Managed Medicaid Plan:  Yes  Subjective:  Care Team: Primary Care Provider: [name], MD ; Next Scheduled Visit: 04/23/23  Medication Access/Adherence  Current Pharmacy:  [facility], [address] [place] [address] Phone: [phone] Fax: [phone]   Patient reports affordability concerns with their medications: No  Patient reports access/transportation concerns to their pharmacy: No  Patient reports adherence concerns with their medications:  No    Diabetes:  Current medications: metformin 500 mg daily  Hypertension:  Current medications: amlodipine 5 mg daily, valsartan/HCTZ 320/25 mg daily   Patient does not have a validated, automated, upper arm home BP cuff  Patient reports periodic hypotensive s/sx including fatigue, dizziness - usually after she's walked a good distance. Denies positional dizziness. Denies shortness of breath or swelling in her extremities.   Reports she now has her CPAP machine for the past 1-2 weeks.   Hyperlipidemia/ASCVD Risk Reduction  Current lipid lowering medications: rosuvastatin 5 mg daily    Antiplatelet regimen:   Objective:  Lab Results  Component Value Date   HGBA1C 6.6 (H) 11/18/2022    Lab Results  Component Value Date   CREATININE 1.02 (H) 11/18/2022   BUN 15 11/18/2022   NA 144 11/18/2022   K 3.9 11/18/2022   CL 106 11/18/2022   CO2 19 (L) 11/18/2022    Lab Results  Component Value Date   CHOL 143 12/05/2022   HDL 45 12/05/2022   LDLCALC 87 12/05/2022    TRIG 53 12/05/2022   CHOLHDL 3.2 12/05/2022    Medications Reviewed Today     Reviewed by Osker Mason, RPH-CPP (Pharmacist) on 01/14/23 at [phone]  Med List Status: <None>   Medication Order Taking? Sig Documenting Provider Last Dose Status Informant  amLODipine (NORVASC) 10 MG tablet [id number] Yes Take 0.5 tablets (5 mg total) by mouth daily. Revankar, Reita Cliche, MD Taking Active   Blood Pressure Monitoring [facility]) MISC [id number] No 1 each by Does not apply route in the morning and at bedtime.  Patient not taking: Reported on 01/14/2023   [name], FNP Not Taking Active   cholecalciferol (VITAMIN D3) 25 MCG (1000 UNIT) tablet [id number] Yes Take 400 Units by mouth daily.  [provider] Taking Active   escitalopram (LEXAPRO) 10 MG tablet [id number] Yes Take 10 mg by mouth daily. [provider] Taking Active   metFORMIN (GLUCOPHAGE) 500 MG tablet [id number] Yes Take 1 tablet (500 mg total) by mouth daily with breakfast. [name], FNP Taking Active   Multiple Vitamin (MULTIVITAMIN) tablet [id number] Yes Take 1 tablet by mouth daily. [provider] Taking Active   rosuvastatin (CRESTOR) 5 MG tablet [id number] Yes Take 1 tablet (5 mg total) by mouth daily. [name], FNP Taking Active   triamcinolone acetonide (KENALOG-40) injection 80 mg [id number]   [name], MD  Active   valsartan-hydrochlorothiazide (DIOVAN-HCT) 320-25 MG tablet [id number] Yes Take 1 tablet by mouth daily. [name], FNP Taking Active  Assessment/Plan:   Diabetes: - Currently controlled - Reviewed long term cardiovascular and renal outcomes of uncontrolled blood sugar - Reviewed goal A1c, goal fasting, and goal 2 hour post prandial glucose - Recommend to continue current regimen  - Recommend to check glucose periodically.   Hypertension: - Currently controlled - Reviewed long term cardiovascular and renal outcomes of uncontrolled blood pressure -  Reviewed appropriate blood pressure monitoring technique and reviewed goal blood pressure. Will collaborate with payor to order home BP cuff.  - Recommend to continue current regimen at this time   Hyperlipidemia/ASCVD Risk Reduction: - Currently controlled.  - Recommend to continue current regimen at this time   Follow Up Plan: phone call in 6 weeks  Catie TJodi Mourning, PharmD, [place], [facility] [phone]

## 2023-01-15 ENCOUNTER — Encounter: Payer: Self-pay | Admitting: *Deleted

## 2023-01-15 ENCOUNTER — Other Ambulatory Visit: Payer: Medicaid Other | Admitting: *Deleted

## 2023-01-15 ENCOUNTER — Other Ambulatory Visit: Payer: Self-pay | Admitting: Family Medicine

## 2023-01-15 DIAGNOSIS — Z1211 Encounter for screening for malignant neoplasm of colon: Secondary | ICD-10-CM

## 2023-01-15 NOTE — Patient Outreach (Signed)
Medicaid Managed Care   Nurse Care Manager Note  06/16/4695 Name:  Virginia Barron MRN:  [id number] DOB:  [birth year]  Virginia Barron is an 62 y.o. year old female who is a primary patient of [name], Virginia Maxwell, MD.  The Medicaid Managed Care Coordination team was consulted for assistance with:    CHF HTN DMII  Ms. Virginia Barron was given information about Medicaid Managed Care Coordination team services today. Virginia Barron Patient agreed to services and verbal consent obtained.  Engaged with patient by telephone for follow up visit in response to provider referral for case management and/or care coordination services.   Assessments/Interventions:  Review of past medical history, allergies, medications, health status, including review of consultants reports, laboratory and other test data, was performed as part of comprehensive evaluation and provision of chronic care management services.  SDOH (Social Determinants of Health) assessments and interventions performed: SDOH Interventions    Flowsheet Row Patient Outreach Telephone from 01/06/2023 in [place] Patient Outreach Telephone from 12/16/2022 in [place] Patient Outreach Telephone from 12/12/2022 in [place] Patient Outreach Telephone from 12/04/2022 in [place] Patient Outreach Telephone from 11/26/2022 in [place] Patient Outreach Telephone from 11/21/2022 in [place]  SDOH Interventions        Transportation Interventions -- -- Intervention Not Indicated -- -- --  Alcohol Usage Interventions -- -- -- -- Intervention Not Indicated (Score <7) --  Depression Interventions/Treatment  -- -- -- -- -- Referral to Psychiatry  Stress Interventions Offered [employer], Provide Counseling Provide Counseling, Offered [employer], Other  (Comment)  [Starting services with [place], Provide Counseling -- Provide Counseling, Offered Community Wellness Resources       Care Plan  Allergies  Allergen Reactions   Avocado    Iodine Hives, Photosensitivity and Swelling   Kiwi Extract    Penicillins    Pork-Derived Products    Shellfish Allergy    Latex Hives, Itching, Rash and Swelling    Medications Reviewed Today     Reviewed by Virginia Montane, RN (Registered Nurse) on 01/15/23 at 1017  Med List Status: <None>   Medication Order Taking? Sig Documenting Provider Last Dose Status Informant  amLODipine (NORVASC) 10 MG tablet [id number] Yes Take 0.5 tablets (5 mg total) by mouth daily. Revankar, Reita Cliche, MD Taking Active   Blood Pressure Monitoring [facility]) MISC [id number] No 1 each by Does not apply route in the morning and at bedtime.  Patient not taking: Reported on 01/14/2023   Virginia Crouch, FNP Not Taking Active   cholecalciferol (VITAMIN D3) 25 MCG (1000 UNIT) tablet [id number] Yes Take 400 Units by mouth daily.  [provider] Taking Active   escitalopram (LEXAPRO) 10 MG tablet [id number] Yes Take 10 mg by mouth daily. [provider] Taking Active   metFORMIN (GLUCOPHAGE) 500 MG tablet [id number] Yes Take 1 tablet (500 mg total) by mouth daily with breakfast. [place] Crouch, FNP Taking Active   Multiple Vitamin (MULTIVITAMIN) tablet [id number] Yes Take 1 tablet by mouth daily. [provider] Taking Active   rosuvastatin (CRESTOR) 5 MG tablet [id number] Yes Take 1 tablet (5 mg total) by mouth daily. [place] Crouch, FNP Taking Active   triamcinolone acetonide (KENALOG-40) injection 80 mg [id number]   Virginia Brome, MD  Active   valsartan-hydrochlorothiazide (DIOVAN-HCT) 320-25 MG tablet [id number] Yes Take 1 tablet by mouth daily. [name],  Virginia Holter, FNP Taking Active             Patient Active Problem List   Diagnosis Date Noted   Type 2 diabetes mellitus  without complication, without long-term current use of insulin ([place]) 12/05/2022   Encounter for screening mammogram for malignant neoplasm of breast 12/05/2022   Morbid obesity with body mass index (BMI) of 50.0 to 59.9 in adult ([place]) 11/26/2022   Primary osteoarthritis of left knee 11/18/2022   Chronic diastolic heart failure ([place]) 11/18/2022   Dyslipidemia associated with type 2 diabetes mellitus ([place]) 09/09/2021   Essential hypertension 08/15/2020   Fibroids    Urge incontinence 07/30/2020   OSA (obstructive sleep apnea) 07/30/2020   Morbid obesity with body mass index (BMI) of 45.0 to 49.9 in adult [facility]) 07/30/2020    Conditions to be addressed/monitored per PCP order:  CHF, HTN, and DMII  Care Plan : RN Care Manager Plan of Care  Updates made by Virginia Montane, RN since 01/15/2023 12:00 AM     Problem: Health Management needs related to CHF and DMII      Long-Range Goal: Development of Plan of Care to address Health Management needs related to CHF and DMII   Start Date: 11/26/2022  Expected End Date: 03/26/2023  Note:   Current Barriers:  Chronic Disease Management support and education needs related to CHF and DMII  Patient seen by PCP for follow up BP. Now taking Valsartan-HCTZ in addition to Amlodipine, unable to check BP at home. Waiting on appropriate size cuff. Patient would like a referral for colon cancer screening.    RNCM Clinical Goal(s):  Patient will verbalize understanding of plan for management of CHF and DMII as evidenced by patient reports take all medications exactly as prescribed and will call provider for medication related questions as evidenced by patient reports and EMR documentation    attend all scheduled medical appointments: 01/24/23 with LCSW and 04/23/23 with PCP as evidenced by provider documentation        continue to work with [place] and/or Social Worker to address care management and care coordination needs related to CHF and DMII as  evidenced by adherence to CM Team Scheduled appointments       Interventions: Inter-disciplinary care team collaboration (see longitudinal plan of care) Evaluation of current treatment plan related to  self management and patient's adherence to plan as established by provider Collaborate with PCP for referral for Colon Cancer Screening and update on BP cuff Followed up on Member Benefits, patient received [place] benefits, but unsure if she will be able to use it Reviewed provider notes and discussed with patient    Hypertension Interventions:  (Status:  Goal on track:  Yes.) Long Term Goal Last practice recorded BP readings:  BP Readings from Last 3 Encounters:  12/19/22 130/80  12/05/22 (!) 140/80  11/27/22 (!) 178/116  Most recent eGFR/CrCl:  Lab Results  Component Value Date   EGFR 62 11/18/2022    No components found for: "CRCL"  Evaluation of current treatment plan related to hypertension self management and patient's adherence to plan as established by provider Provided education to patient re: stroke prevention, s/s of heart attack and stroke Reviewed medications with patient and discussed importance of compliance Advised patient, providing education and rationale, to monitor blood pressure daily and record, calling PCP for findings outside established parameters Reviewed scheduled/upcoming provider appointments including: 04/23/23 with PCP  Heart Failure Interventions:  (Status: Goal on Track (progressing): YES.)  Long  Term Goal  Wt Readings from Last 3 Encounters:  12/19/22 287 lb (130.2 kg)  12/05/22 288 lb (130.6 kg)  11/27/22 292 lb (132.5 kg)   Provided education on low sodium diet Discussed the importance of keeping all appointments with provider Provided patient with education about the role of exercise in the management of heart failure Assessed social determinant of health barriers Discussed CPAP, patient wearing every night, all night long Advised patient to  express any concerns, questions or changes in her health with provider  Diabetes:  (Status: Goal on Track (progressing): YES.) Long Term Goal   Lab Results  Component Value Date   HGBA1C 6.6 (H) 11/18/2022   @ Assessed patient's understanding of A1c goal: <7% Reviewed medications with patient and discussed importance of medication adherence;        Counseled on importance of regular laboratory monitoring as prescribed;        Reviewed scheduled/upcoming provider appointments including: 04/23/23 with PCP;         Review of patient status, including review of consultants reports, relevant laboratory and other test results, and medications completed;       Provided education regarding Diabetes via MyChart  Patient Goals/Self-Care Activities: Take medications as prescribed   Attend all scheduled provider appointments Call provider office for new concerns or questions  Work with the social worker to address care coordination needs and will continue to work with the clinical team to address health care and disease management related needs use salt in moderation watch for swelling in feet, ankles and legs every day eat more whole grains, fruits and vegetables, lean meats and healthy fats dress right for the weather, hot or cold keep appointment with eye doctor check feet daily for cuts, sores or redness drink 6 to 8 glasses of water each day fill half of plate with vegetables switch to sugar-free drinks       Follow Up:  Patient agrees to Care Plan and Follow-up.  Plan: The Managed Medicaid care management team will reach out to the patient again over the next 30 days.  Date/time of next scheduled RN care management/care coordination outreach:  02/19/23 @ [place] RN, BSN [place]  [employer] manager

## 2023-01-15 NOTE — Patient Instructions (Signed)
Visit Information  Virginia Barron was given information about Medicaid Managed Care team care coordination services as a part of their Healthy [facility] Medicaid benefit. Virginia Barron verbally consented to engagement with the [facility] Managed Care team.   If you are experiencing a medical emergency, please call 911 or report to your local emergency department or urgent care.   If you have a non-emergency medical problem during routine business hours, please contact your provider's office and ask to speak with a nurse.   For questions related to your Healthy [facility] health plan, please call: [phone] or visit the homepage here: [email or web address]  If you would like to schedule transportation through your Healthy [facility] plan, please call the following number at least 2 days in advance of your appointment: [phone]  For information about your ride after you set it up, call Ride Assist at [phone]. Use this number to activate a Will Call pickup, or if your transportation is late for a scheduled pickup. Use this number, too, if you need to make a change or cancel a previously scheduled reservation.  If you need transportation services right away, call [phone]. The after-hours call center is staffed 24 hours to handle ride assistance and urgent reservation requests (including discharges) 365 days a year. Urgent trips include sick visits, hospital discharge requests and life-sustaining treatment.  Call the [place] at [phone], at any time, 24 hours a day, 7 days a week. If you are in danger or need immediate medical attention call 911.  If you would like help to quit smoking, call 1-800-QUIT-NOW 254-703-0851) OR Espaol: 1-855-Djelo-Ya (3-151-761-6073) o para ms informacin haga clic aqu or Text READY to 200-400 to register via text  Virginia Barron,   Please see education materials related to Diabetes and HTN  provided by MyChart link.  Patient verbalizes understanding of instructions and care plan provided today and agrees to view in [place]. Active MyChart status and patient understanding of how to access instructions and care plan via MyChart confirmed with patient.     Telephone follow up appointment with Managed Medicaid care management team member scheduled for:02/19/23 @ [place] RN, [place] RN Care Coordinator   Following is a copy of your plan of care:  Care Plan : RN Care Manager Plan of Care  Updates made by [name], RN since 01/15/2023 12:00 AM     Problem: Health Management needs related to CHF and DMII      Long-Range Goal: Development of Plan of Care to address Health Management needs related to CHF and DMII   Start Date: 11/26/2022  Expected End Date: 03/26/2023  Note:   Current Barriers:  Chronic Disease Management support and education needs related to CHF and DMII  Patient seen by PCP for follow up BP. Now taking Valsartan-HCTZ in addition to Amlodipine, unable to check BP at home. Waiting on appropriate size cuff. Patient would like a referral for colon cancer screening.    RNCM Clinical Goal(s):  Patient will verbalize understanding of plan for management of CHF and DMII as evidenced by patient reports take all medications exactly as prescribed and will call provider for medication related questions as evidenced by patient reports and EMR documentation    attend all scheduled medical appointments: 01/24/23 with LCSW and 04/23/23 with PCP as evidenced by provider documentation        continue to work with RN Care Manager and/or Social Worker to address care  management and care coordination needs related to CHF and DMII as evidenced by adherence to CM Team Scheduled appointments       Interventions: Inter-disciplinary care team collaboration (see longitudinal plan of care) Evaluation of current treatment plan related to   self management and patient's adherence to plan as established by provider Collaborate with PCP for referral for Colon Cancer Screening and update on BP cuff Followed up on Member Benefits, patient received [place] benefits, but unsure if she will be able to use it Reviewed provider notes and discussed with patient    Hypertension Interventions:  (Status:  Goal on track:  Yes.) Long Term Goal Last practice recorded BP readings:  BP Readings from Last 3 Encounters:  12/19/22 130/80  12/05/22 (!) 140/80  11/27/22 (!) 178/116  Most recent eGFR/CrCl:  Lab Results  Component Value Date   EGFR 62 11/18/2022    No components found for: "CRCL"  Evaluation of current treatment plan related to hypertension self management and patient's adherence to plan as established by provider Provided education to patient re: stroke prevention, s/s of heart attack and stroke Reviewed medications with patient and discussed importance of compliance Advised patient, providing education and rationale, to monitor blood pressure daily and record, calling PCP for findings outside established parameters Reviewed scheduled/upcoming provider appointments including: 04/23/23 with PCP  Heart Failure Interventions:  (Status: Goal on Track (progressing): YES.)  Long Term Goal  Wt Readings from Last 3 Encounters:  12/19/22 287 lb (130.2 kg)  12/05/22 288 lb (130.6 kg)  11/27/22 292 lb (132.5 kg)   Provided education on low sodium diet Discussed the importance of keeping all appointments with provider Provided patient with education about the role of exercise in the management of heart failure Assessed social determinant of health barriers Discussed CPAP, patient wearing every night, all night long Advised patient to express any concerns, questions or changes in her health with provider  Diabetes:  (Status: Goal on Track (progressing): YES.) Long Term Goal   Lab Results  Component Value Date   HGBA1C 6.6 (H) 11/18/2022    @ Assessed patient's understanding of A1c goal: <7% Reviewed medications with patient and discussed importance of medication adherence;        Counseled on importance of regular laboratory monitoring as prescribed;        Reviewed scheduled/upcoming provider appointments including: 04/23/23 with PCP;         Review of patient status, including review of consultants reports, relevant laboratory and other test results, and medications completed;       Provided education regarding Diabetes via MyChart  Patient Goals/Self-Care Activities: Take medications as prescribed   Attend all scheduled provider appointments Call provider office for new concerns or questions  Work with the social worker to address care coordination needs and will continue to work with the clinical team to address health care and disease management related needs use salt in moderation watch for swelling in feet, ankles and legs every day eat more whole grains, fruits and vegetables, lean meats and healthy fats dress right for the weather, hot or cold keep appointment with eye doctor check feet daily for cuts, sores or redness drink 6 to 8 glasses of water each day fill half of plate with vegetables switch to sugar-free drinks

## 2023-01-23 DIAGNOSIS — F39 Unspecified mood [affective] disorder: Secondary | ICD-10-CM | POA: Diagnosis not present

## 2023-01-24 ENCOUNTER — Other Ambulatory Visit: Payer: Medicaid Other | Admitting: Licensed Clinical Social Worker

## 2023-01-24 NOTE — Patient Outreach (Signed)
Medicaid Managed Care Social Work Note  [id number] Name:  Virginia Barron MRN:  [id number] DOB:  [id number]  Dai Heckmann is an 62 y.o. year old female who is a primary patient of [name], Kirsten, MD.  The Medicaid Managed Care Coordination team was consulted for assistance with:  [place] and Resources  Ms. Hett was given information about Medicaid Managed Care Coordination team services today. Julane Angst Patient agreed to services and verbal consent obtained.  Engaged with patient  for by telephone forfollow up visit in response to referral for case management and/or care coordination services.   Assessments/Interventions:  Review of past medical history, allergies, medications, health status, including review of consultants reports, laboratory and other test data, was performed as part of comprehensive evaluation and provision of chronic care management services.  SDOH: (Social Determinant of Health) assessments and interventions performed: SDOH Interventions    Flowsheet Row Patient Outreach Telephone from 01/24/2023 in [place] Patient Outreach Telephone from 01/06/2023 in [place] Patient Outreach Telephone from 12/16/2022 in [place] Patient Outreach Telephone from 12/12/2022 in [place] Patient Outreach Telephone from 12/04/2022 in [place] Patient Outreach Telephone from 11/26/2022 in [place]  SDOH Interventions        Transportation Interventions -- -- -- Intervention Not Indicated -- --  Alcohol Usage Interventions -- -- -- -- -- Intervention Not Indicated (Score <7)  Stress Interventions Offered [employer], Provide Counseling Offered [employer] Resources, Provide Counseling Provide Counseling, Offered [facility] Resources, Other (Comment)   [Starting services with [place], Provide Counseling --       Advanced Directives Status:  See Care Plan for related entries.  Care Plan                 Allergies  Allergen Reactions   Avocado    Iodine Hives, Photosensitivity and Swelling   Kiwi Extract    Penicillins    Pork-Derived Products    Shellfish Allergy    Latex Hives, Itching, Rash and Swelling    Medications Reviewed Today     Reviewed by [name], RN (Registered Nurse) on 01/15/23 at 1017  Med List Status: <None>   Medication Order Taking? Sig Documenting Provider Last Dose Status Informant  amLODipine (NORVASC) 10 MG tablet [id number] Yes Take 0.5 tablets (5 mg total) by mouth daily. Revankar, Reita Cliche, MD Taking Active   Blood Pressure Monitoring [facility]) MISC [id number] No 1 each by Does not apply route in the morning and at bedtime.  Patient not taking: Reported on 01/14/2023   [name], FNP Not Taking Active   cholecalciferol (VITAMIN D3) 25 MCG (1000 UNIT) tablet [id number] Yes Take 400 Units by mouth daily.  [provider] Taking Active   escitalopram (LEXAPRO) 10 MG tablet [id number] Yes Take 10 mg by mouth daily. [provider] Taking Active   metFORMIN (GLUCOPHAGE) 500 MG tablet [id number] Yes Take 1 tablet (500 mg total) by mouth daily with breakfast. [name], FNP Taking Active   Multiple Vitamin (MULTIVITAMIN) tablet [id number] Yes Take 1 tablet by mouth daily. [provider] Taking Active   rosuvastatin (CRESTOR) 5 MG tablet [id number] Yes Take 1 tablet (5 mg total) by mouth daily. [name], FNP Taking Active   triamcinolone acetonide (KENALOG-40) injection 80 mg [id number]   [name], MD  Active   valsartan-hydrochlorothiazide (DIOVAN-HCT) [phone]  MG tablet [id number] Yes Take 1 tablet by mouth daily. [name], FNP Taking Active             Patient Active Problem List   Diagnosis Date Noted   Type 2 diabetes  mellitus without complication, without long-term current use of insulin ([place]) 12/05/2022   Encounter for screening mammogram for malignant neoplasm of breast 12/05/2022   Morbid obesity with body mass index (BMI) of 50.0 to 59.9 in adult ([place]) 11/26/2022   Primary osteoarthritis of left knee 11/18/2022   Chronic diastolic heart failure ([place]) 11/18/2022   Dyslipidemia associated with type 2 diabetes mellitus ([place]) 09/09/2021   Essential hypertension 08/15/2020   Fibroids    Urge incontinence 07/30/2020   OSA (obstructive sleep apnea) 07/30/2020   Morbid obesity with body mass index (BMI) of 45.0 to 49.9 in adult [facility]) 07/30/2020    Conditions to be addressed/monitored per PCP order:  Depression  Care Plan : LCSW Plan of Care  Updates made by Greg Cutter, LCSW since 01/24/2023 12:00 AM     Problem: Depression Identification (Depression)      Long-Range Goal: Depressive Symptoms Identified   Start Date: 11/21/2022  Note:   Priority: High  Timeframe:  Long-Range Goal Priority:  High Start Date:   11/21/22            Expected End Date:  ongoing                     Follow Up Date--02/21/23 at 9 am  - keep 90 percent of scheduled appointments -consider counseling or psychiatry -consider bumping up your self-care  -consider creating a stronger support network   Why is this important?             Combatting depression may take some time.            If you don't feel better right away, don't give up on your treatment plan.    Current barriers:   Chronic Mental Health needs related to depression and stress. Patient requires Support, Education, Resources, Referrals, Advocacy, and Care Coordination, in order to meet Unmet Mental Health Needs. Limited support network Patient will implement clinical interventions discussed today to decrease symptoms of depression and increase knowledge and/or ability of: coping skills. Mental Health Concerns and Social Isolation Patient lacks  knowledge of available community counseling agencies and resources.  Clinical Goal(s): verbalize understanding of plan for management of Depression, and Stress and demonstrate a reduction in symptoms. Patient will connect with a provider for ongoing mental health treatment, increase coping skills, healthy habits, self-management skills, and stress reduction        Clinical Interventions:  Assessed patient's previous and current treatment, coping skills, support system and barriers to care. Patient provided hx  Verbalization of feelings encouraged, motivational interviewing employed Emotional support provided, positive coping strategies explored. Establishing healthy boundaries emphasized and healthy self-care education provided Patient was educated on available mental health resources within their area that accept Medicaid and offer counseling and psychiatry. Patient reports significant worsening depression and stress impacting her ability to function appropriately and carry out daily task. Patient reports never having mental health support. 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 She reports that she has a close friend [name] that lives in [place] that she talks to on the phone to gain socialization.  Patient [facility]able [facility]. Patient prefers virtual counseling. [facility] LCSW  made referral on 11/21/22. UPDATE- Patient reports that she was able to establish contact with agency but she thinks her initial appointment with them was only with a psychiatrist but she will look through her paperwork and double check. Patient reports interest in gaining counseling as well. Email will be sent to [place] if needed during next social work outreach. UPDATE- Patient has been enrolled in [place] program. She has started psychiatry services only and will consider implementing counseling services as well. [facility] LCSW provided education on basic self-care  that she can use daily to prevent winter sickness and promote positive physical and mental health. 01/06/23 LCSW update- Patient reports that her next psychiatrist appointment with [place] is on 01/23/23. She reports that she remains on a wait list for counseling but does not wish for [facility] LCSW to check her status on this as she will question this herself during her next mental health appointment with [place]. [facility] LCSW provided brief emotional support and self-care education. [facility] LCSW Update for 01/24/23- Patient reports that she has been actively receiving care from [place] but has not yet stated counseling. Patient reports that she will check on getting counseling during her next psychiatry appointment. If patient has issues scheduling her initial counseling appointment then she had agreed to contact [facility] LCSW directly for assistance. Self-care education provided as well as crisis support resources. LCSW provided education on relaxation techniques such as meditation, deep breathing, massage, grounding exercises or yoga that can activate the body's relaxation response and ease symptoms of stress and anxiety. LCSW ask that when pt is struggling with difficult emotions and racing thoughts that they start this relaxation response process. LCSW provided extensive education on healthy coping skills for anxiety. SW used active and reflective listening, validated patient's feelings/concerns, and provided emotional support. Patient will work on implementing appropriate self-care habits into their daily routine such as: staying positive, writing a gratitude list, drinking water, staying active around the house, taking their medications as prescribed, combating negative thoughts or emotions and staying connected with their family and friends. Positive reinforcement provided for this decision to work on this. Motivational Interviewing employed Depression screen reviewed  PHQ2/ PHQ9 completed or reviewed  Mindfulness or Relaxation  training provided Active listening / Reflection utilized  Advance Care and HCPOA education provided Emotional Support Provided Problem [place] strategies reviewed Provided psychoeducation for mental health needs  Provided brief CBT  Reviewed mental health medications and discussed importance of compliance:  Quality of sleep assessed & Sleep Hygiene techniques promoted  Participation in counseling encouraged  Verbalization of feelings encouraged  Suicidal Ideation/Homicidal Ideation assessed: Patient denies SI/HI  Review resources, discussed options and provided patient information about  [place] care team collaboration (see longitudinal plan of care) Patient Goals/Self-Care Activities: Over the next 120 days Attend scheduled medical appointments Utilize healthy coping skills and supportive resources discussed Contact PCP with any questions or concerns Keep 90 percent of counseling appointments Call your insurance provider for more information about your Enhanced Benefits  Check out counseling resources provided  Begin personal counseling with LCSW, to reduce and manage symptoms of Depression and Stress, until well-established with mental health provider Accept all calls from representative with [facility] in an effort to establish ongoing mental health counseling and supportive services. Incorporate into daily practice - relaxation techniques, deep breathing exercises, and mindfulness meditation strategies. Talk about feelings with friends, family members, spiritual advisor, etc. Contact LCSW directly [phone]), if you have questions, need assistance, or  if additional social work needs are identified between now and our next scheduled telephone outreach call. Call 988 for mental health hotline/crisis line if needed (24/7 available) Try techniques to reduce symptoms of anxiety/negative thinking (deep breathing,  distraction, positive self talk, etc)  - develop a personal safety plan - develop a plan to deal with triggers like holidays, anniversaries - exercise at least 2 to 3 times per week - have a plan for how to handle bad days - journal feelings and what helps to feel better or worse - spend time or talk with others at least 2 to 3 times per week - watch for early signs of feeling worse - begin personal counseling - call and visit an old friend - check out volunteer opportunities - join a support group - laugh; watch a funny movie or comedian - learn and use visualization or guided imagery - perform a random act of kindness - practice relaxation or meditation daily - start or continue a personal journal - practice positive thinking and self-talk -continue with compliance of taking medication  -identify current effective and ineffective coping strategies.  -implement positive self-talk in care to increase self-esteem, confidence and feelings of control.  -consider alternative and complementary therapy approaches such as meditation, mindfulness or yoga.  -journaling, prayer, worship services, meditation or pastoral counseling.  -increase participation in pleasurable group activities such as hobbies, singing, sports or volunteering).  -consider the use of meditative movement therapy such as tai chi, yoga or qigong.  -start a regular daily exercise program based on tolerance, ability and patient choice to support positive thinking and activity       11/21/2022   10:07 AM 11/18/2022    3:42 PM 04/08/2021    7:58 PM 10/23/2020   12:08 PM  Depression screen PHQ 2/9  Decreased Interest 1 1 0 0  Down, Depressed, Hopeless 1 0 0 2  PHQ - 2 Score 2 1 0 2  Altered sleeping 2 2  0  Tired, decreased energy 1 1  1  $ Change in appetite 0 0  2  Feeling bad or failure about yourself  0 0  0  Trouble concentrating 1 1  0  Moving slowly or fidgety/restless 0 0    Suicidal thoughts 0 0  1  PHQ-9 Score  6 5  6  $ Difficult doing work/chores Somewhat difficult Somewhat difficult  Somewhat difficult   10 LITTLE Things To Do When You're Feeling Too Down To Do Anything  Take a shower. Even if you plan to stay in all day long and not see a soul, take a shower. It takes the most effort to hop in to the shower but once you do, you'll feel immediate results. It will wake you up and you'll be feeling much fresher (and cleaner too).  Brush and floss your teeth. Give your teeth a good brushing with a floss finish. It's a small task but it feels so good and you can check 'taking care of your health' off the list of things to do.  Do something small on your list. Most of [place] have some small thing we would like to get done (load of laundry, sew a button, email a friend). Doing one of these things will make you feel like you've accomplished something.  Drink water. Drinking water is easy right? It's also really beneficial for your health so keep a glass beside you all day and take sips often. It gives you energy and prevents you from boredom  eating.  Do some floor exercises. The last thing you want to do is exercise but it might be just the thing you need the most. Keep it simple and do exercises that involve sitting or laying on the floor. Even the smallest of exercises release chemicals in the brain that make you feel good. Yoga stretches or core exercises are going to make you feel good with minimal effort.  Make your bed. Making your bed takes a few minutes but it's productive and you'll feel relieved when it's done. An unmade bed is a huge visual reminder that you're having an unproductive day. Do it and consider it your housework for the day.  Put on some nice clothes. Take the sweatpants off even if you don't plan to go anywhere. Put on clothes that make you feel good. Take a look in the mirror so your brain recognizes the sweatpants have been replaced with clothes that make you look great. It's an  instant confidence booster.  Wash the dishes. A pile of dirty dishes in the sink is a reflection of your mood. It's possible that if you wash up the dishes, your mood will follow suit. It's worth a try.  Cook a real meal. If you have the luxury to have a "do nothing" day, you have time to make a real meal for yourself. Make a meal that you love to eat. The process is good to get you out of the funk and the food will ensure you have more energy for tomorrow.  Write out your thoughts by hand. When you hand write, you stimulate your brain to focus on the moment that you're in so make yourself comfortable and write whatever comes into your mind. Put those thoughts out on paper so they stop spinning around in your head. Those thoughts might be the very thing holding you down.  If you are experiencing a Mental Health or [place] or need someone to talk to, please call the Suicide and Crisis Lifeline: 988    Patient Goals: Follow up goal      Follow up:  Patient agrees to Care Plan and Follow-up.  Plan: The Managed Medicaid care management team will reach out to the patient again over the next 30 days.  Date/time of next scheduled Social Work care management/care coordination outreach:  02/21/23 at 9 am. Eula Fried, BSW, MSW, [place] Medicaid LCSW [email or web address] Phone: [phone]

## 2023-01-24 NOTE — Patient Instructions (Signed)
Visit Information  Ms. Haan was given information about Medicaid Managed Care team care coordination services as a part of their Healthy [facility] Medicaid benefit. Signa Lepera verbally consented to engagement with the [facility] Managed Care team.   If you are experiencing a medical emergency, please call 911 or report to your local emergency department or urgent care.   If you have a non-emergency medical problem during routine business hours, please contact your provider's office and ask to speak with a nurse.   For questions related to your Healthy [facility] health plan, please call: [phone] or visit the homepage here: [email or web address]  If you would like to schedule transportation through your Healthy [facility] plan, please call the following number at least 2 days in advance of your appointment: [phone]  For information about your ride after you set it up, call Ride Assist at [phone]. Use this number to activate a Will Call pickup, or if your transportation is late for a scheduled pickup. Use this number, too, if you need to make a change or cancel a previously scheduled reservation.  If you need transportation services right away, call [phone]. The after-hours call center is staffed 24 hours to handle ride assistance and urgent reservation requests (including discharges) 365 days a year. Urgent trips include sick visits, hospital discharge requests and life-sustaining treatment.  Call the [place] at [phone], at any time, 24 hours a day, 7 days a week. If you are in danger or need immediate medical attention call 911.  If you would like help to quit smoking, call 1-800-QUIT-NOW 5803608631) OR Espaol: 1-855-Djelo-Ya QO:409462) o para ms informacin haga clic aqu or Text READY to 200-400 to register via text    Following is a copy of your plan of care:  Care Plan : LCSW Plan of Care   Updates made by Greg Cutter, LCSW since 01/24/2023 12:00 AM     Problem: Depression Identification (Depression)      Long-Range Goal: Depressive Symptoms Identified   Start Date: 11/21/2022  Note:   Priority: High  Timeframe:  Long-Range Goal Priority:  High Start Date:   11/21/22            Expected End Date:  ongoing                     Follow Up Date--02/21/23 at 9 am  - keep 90 percent of scheduled appointments -consider counseling or psychiatry -consider bumping up your self-care  -consider creating a stronger support network   Why is this important?             Combatting depression may take some time.            If you don't feel better right away, don't give up on your treatment plan.    Current barriers:   Chronic Mental Health needs related to depression and stress. Patient requires Support, Education, Resources, Referrals, Advocacy, and Care Coordination, in order to meet Unmet Mental Health Needs. Limited support network Patient will implement clinical interventions discussed today to decrease symptoms of depression and increase knowledge and/or ability of: coping skills. Mental Health Concerns and Social Isolation Patient lacks knowledge of available community counseling agencies and resources.  Clinical Goal(s): verbalize understanding of plan for management of Depression, and Stress and demonstrate a reduction in symptoms. Patient will connect with a provider for ongoing mental health treatment, increase coping skills, healthy habits, self-management skills, and stress reduction  Patient Goals/Self-Care Activities: Over the next 120 days Attend scheduled medical appointments Utilize healthy coping skills and supportive resources discussed Contact PCP with any questions or concerns Keep 90 percent of counseling appointments Call your insurance provider for more information about your Enhanced Benefits  Check out counseling resources provided  Begin  personal counseling with LCSW, to reduce and manage symptoms of Depression and Stress, until well-established with mental health provider Accept all calls from representative with [facility] [place] in an effort to establish ongoing mental health counseling and supportive services. Incorporate into daily practice - relaxation techniques, deep breathing exercises, and mindfulness meditation strategies. Talk about feelings with friends, family members, spiritual advisor, etc. Contact LCSW directly [phone]), if you have questions, need assistance, or if additional social work needs are identified between now and our next scheduled telephone outreach call. Call [phone] for mental health hotline/crisis line if needed (24/7 available) Try techniques to reduce symptoms of anxiety/negative thinking (deep breathing, distraction, positive self talk, etc)  - develop a personal safety plan - develop a plan to deal with triggers like holidays, anniversaries - exercise at least 2 to 3 times per week - have a plan for how to handle bad days - journal feelings and what helps to feel better or worse - spend time or talk with others at least 2 to 3 times per week - watch for early signs of feeling worse - begin personal counseling - call and visit an old friend - check out volunteer opportunities - join a support group - laugh; watch a funny movie or comedian - learn and use visualization or guided imagery - perform a random act of kindness - practice relaxation or meditation daily - start or continue a personal journal - practice positive thinking and self-talk -continue with compliance of taking medication  -identify current effective and ineffective coping strategies.  -implement positive self-talk in care to increase self-esteem, confidence and feelings of control.  -consider alternative and complementary therapy approaches such as meditation, mindfulness or yoga.  -journaling, prayer,  worship services, meditation or pastoral counseling.  -increase participation in pleasurable group activities such as hobbies, singing, sports or volunteering).  -consider the use of meditative movement therapy such as tai chi, yoga or qigong.  -start a regular daily exercise program based on tolerance, ability and patient choice to support positive thinking and activity       11/21/2022   10:07 AM 11/18/2022    3:42 PM 04/08/2021    7:58 PM 10/23/2020   12:08 PM  Depression screen PHQ 2/9  Decreased Interest 1 1 0 0  Down, Depressed, Hopeless 1 0 0 2  PHQ - 2 Score 2 1 0 2  Altered sleeping 2 2  0  Tired, decreased energy 1 1  1  $ Change in appetite 0 0  2  Feeling bad or failure about yourself  0 0  0  Trouble concentrating 1 1  0  Moving slowly or fidgety/restless 0 0    Suicidal thoughts 0 0  1  PHQ-9 Score 6 5  6  $ Difficult doing work/chores Somewhat difficult Somewhat difficult  Somewhat difficult   10 LITTLE Things To Do When You're Feeling Too Down To Do Anything  Take a shower. Even if you plan to stay in all day long and not see a soul, take a shower. It takes the most effort to hop in to the shower but once you do, you'll feel immediate results. It will wake you up  and you'll be feeling much fresher (and cleaner too).  Brush and floss your teeth. Give your teeth a good brushing with a floss finish. It's a small task but it feels so good and you can check 'taking care of your health' off the list of things to do.  Do something small on your list. Most of [place] have some small thing we would like to get done (load of laundry, sew a button, email a friend). Doing one of these things will make you feel like you've accomplished something.  Drink water. Drinking water is easy right? It's also really beneficial for your health so keep a glass beside you all day and take sips often. It gives you energy and prevents you from boredom eating.  Do some floor exercises. The last thing  you want to do is exercise but it might be just the thing you need the most. Keep it simple and do exercises that involve sitting or laying on the floor. Even the smallest of exercises release chemicals in the brain that make you feel good. Yoga stretches or core exercises are going to make you feel good with minimal effort.  Make your bed. Making your bed takes a few minutes but it's productive and you'll feel relieved when it's done. An unmade bed is a huge visual reminder that you're having an unproductive day. Do it and consider it your housework for the day.  Put on some nice clothes. Take the sweatpants off even if you don't plan to go anywhere. Put on clothes that make you feel good. Take a look in the mirror so your brain recognizes the sweatpants have been replaced with clothes that make you look great. It's an instant confidence booster.  Wash the dishes. A pile of dirty dishes in the sink is a reflection of your mood. It's possible that if you wash up the dishes, your mood will follow suit. It's worth a try.  Cook a real meal. If you have the luxury to have a "do nothing" day, you have time to make a real meal for yourself. Make a meal that you love to eat. The process is good to get you out of the funk and the food will ensure you have more energy for tomorrow.  Write out your thoughts by hand. When you hand write, you stimulate your brain to focus on the moment that you're in so make yourself comfortable and write whatever comes into your mind. Put those thoughts out on paper so they stop spinning around in your head. Those thoughts might be the very thing holding you down.  If you are experiencing a Mental Health or [place] or need someone to talk to, please call the Suicide and Crisis Lifeline: 988    Patient Goals: Follow up goal

## 2023-01-30 DIAGNOSIS — F419 Anxiety disorder, unspecified: Secondary | ICD-10-CM | POA: Diagnosis not present

## 2023-01-31 ENCOUNTER — Encounter: Payer: Self-pay | Admitting: Pharmacist

## 2023-01-31 NOTE — Patient Instructions (Signed)
Angie,   It was great talking with you today!  I'll work on ordering you a home blood pressure machine that Medicaid will pay for.   Check your blood pressure twice weekly, and any time you have concerning symptoms like headache, chest pain, dizziness, shortness of breath, or vision changes.   Our goal is less than 130/80.  To appropriately check your blood pressure, make sure you do the following:  1) Avoid caffeine, exercise, or tobacco products for 30 minutes before checking. Empty your bladder. 2) Sit with your back supported in a flat-backed chair. Rest your arm on something flat (arm of the chair, table, etc). 3) Sit still with your feet flat on the floor, resting, for at least 5 minutes.  4) Check your blood pressure. Take 1-2 readings.  5) Write down these readings and bring with you to any provider appointments.  Bring your home blood pressure machine with you to a provider's office for accuracy comparison at least once a year.   Make sure you take your blood pressure medications before you come to any office visit, even if you were asked to fast for labs.   Take care!  Catie Hedwig Morton, PharmD, [place], [facility] [phone]

## 2023-02-06 DIAGNOSIS — G4733 Obstructive sleep apnea (adult) (pediatric): Secondary | ICD-10-CM | POA: Diagnosis not present

## 2023-02-13 DIAGNOSIS — F419 Anxiety disorder, unspecified: Secondary | ICD-10-CM | POA: Diagnosis not present

## 2023-02-19 ENCOUNTER — Encounter: Payer: Self-pay | Admitting: *Deleted

## 2023-02-19 ENCOUNTER — Other Ambulatory Visit: Payer: Medicaid Other | Admitting: *Deleted

## 2023-02-19 NOTE — Patient Outreach (Signed)
Medicaid Managed Care   Nurse Care Manager Note  [id number] Name:  Virginia Barron MRN:  [id number] DOB:  [id number]  Virginia Barron is an 62 y.o. year old female who is a primary patient of [name], [name], MD.  The Medicaid Managed Care Coordination team was consulted for assistance with:    CHF HTN DMII  Ms. Virginia Barron was given information about Medicaid Managed Care Coordination team services today. Virginia Barron Patient agreed to services and verbal consent obtained.  Engaged with patient by telephone for follow up visit in response to provider referral for case management and/or care coordination services.   Assessments/Interventions:  Review of past medical history, allergies, medications, health status, including review of consultants reports, laboratory and other test data, was performed as part of comprehensive evaluation and provision of chronic care management services.  SDOH (Social Determinants of Health) assessments and interventions performed: SDOH Interventions    Flowsheet Row Patient Outreach Telephone from 02/19/2023 in [place] Patient Outreach Telephone from 01/24/2023 in [place] Patient Outreach Telephone from 01/06/2023 in [place] Patient Outreach Telephone from 12/16/2022 in [place] Patient Outreach Telephone from 12/12/2022 in [place] Patient Outreach Telephone from 12/04/2022 in [place]  SDOH Interventions        Transportation Interventions Payor Benefit, Other (Comment)  [Provided patient with [facility] [phone] -- -- -- Intervention Not Indicated --  Stress Interventions -- [employer], Provide Counseling Offered [employer], Provide Counseling Provide Counseling, Offered [employer], Other (Comment)  [Starting services  with [place], Provide Counseling       Care Plan  Allergies  Allergen Reactions   Avocado    Iodine Hives, Photosensitivity and Swelling   Kiwi Extract    Penicillins    Pork-Derived Products    Shellfish Allergy    Latex Hives, Itching, Rash and Swelling    Medications Reviewed Today     Reviewed by Virginia Montane, RN (Registered Nurse) on 02/19/23 at [place] List Status: <None>   Medication Order Taking? Sig Documenting Provider Last Dose Status Informant  amLODipine (NORVASC) 10 MG tablet [id number] Yes Take 0.5 tablets (5 mg total) by mouth daily. Revankar, Virginia Cliche, MD Taking Active   Blood Pressure Monitoring [facility]) MISC [id number] No 1 each by Does not apply route in the morning and at bedtime.  Patient not taking: Reported on 01/14/2023   Virginia Crouch, FNP Not Taking Active   cholecalciferol (VITAMIN D3) 25 MCG (1000 UNIT) tablet [id number] Yes Take 400 Units by mouth daily.  [provider] Taking Active   escitalopram (LEXAPRO) 10 MG tablet [id number] No Take 10 mg by mouth daily.  Patient not taking: Reported on 02/19/2023   [provider] Not Taking Active   metFORMIN (GLUCOPHAGE) 500 MG tablet [id number] Yes Take 1 tablet (500 mg total) by mouth daily with breakfast. Virginia Crouch, FNP Taking Active   Multiple Vitamin (MULTIVITAMIN) tablet [id number] Yes Take 1 tablet by mouth daily. [provider] Taking Active   rosuvastatin (CRESTOR) 5 MG tablet [id number] Yes Take 1 tablet (5 mg total) by mouth daily. Virginia Crouch, FNP Taking Active   triamcinolone acetonide (KENALOG-40) injection 80 mg [id number]   Virginia Brome, MD  Active   valsartan-hydrochlorothiazide (DIOVAN-HCT) 320-25 MG tablet [id number] Yes Take 1 tablet by mouth daily. Virginia Crouch, FNP Taking Active  Patient Active Problem List   Diagnosis Date Noted   Type 2 diabetes mellitus without complication, without long-term  current use of insulin ([place]) 12/05/2022   Encounter for screening mammogram for malignant neoplasm of breast 12/05/2022   Morbid obesity with body mass index (BMI) of 50.0 to 59.9 in adult ([place]) 11/26/2022   Primary osteoarthritis of left knee 11/18/2022   Chronic diastolic heart failure ([place]) 11/18/2022   Dyslipidemia associated with type 2 diabetes mellitus ([place]) 09/09/2021   Essential hypertension 08/15/2020   Fibroids    Urge incontinence 07/30/2020   OSA (obstructive sleep apnea) 07/30/2020   Morbid obesity with body mass index (BMI) of 45.0 to 49.9 in adult [facility]) 07/30/2020    Conditions to be addressed/monitored per PCP order:  CHF, HTN, and DMII  Care Plan : RN Care Manager Plan of Care  Updates made by Virginia Montane, RN since 02/19/2023 12:00 AM     Problem: Health Management needs related to CHF and DMII      Long-Range Goal: Development of Plan of Care to address Health Management needs related to CHF and DMII   Start Date: 11/26/2022  Expected End Date: 03/26/2023  Note:   Current Barriers:  Chronic Disease Management support and education needs related to CHF and DMII  Patient waiting on new BP monitor(new order was needed). She is having dizziness when going from a lying position to standing.     RNCM Clinical Goal(s):  Patient will verbalize understanding of plan for management of CHF and DMII as evidenced by patient reports take all medications exactly as prescribed and will call provider for medication related questions as evidenced by patient reports and EMR documentation    attend all scheduled medical appointments: 02/21/23 with LCSW, 03/14/23 with [facility] Pharmacist, 04/09/23 for Colon Cancer Screen and 04/23/23 with PCP as evidenced by provider documentation        continue to work with [place] and/or Social Worker to address care management and care coordination needs related to CHF and DMII as evidenced by adherence to CM Team Scheduled appointments        Interventions: Inter-disciplinary care team collaboration (see longitudinal plan of care) Evaluation of current treatment plan related to  self management and patient's adherence to plan as established by provider Provided patient with [facility] [phone], call for transportation needs in [place] provider notes and discussed with patient Provided patient with [place] [phone] Provided patient with fall precaution education    Hypertension Interventions:  (Status:  Goal on track:  Yes.) Long Term Goal Last practice recorded BP readings:  BP Readings from Last 3 Encounters:  12/19/22 130/80  12/05/22 (!) 140/80  11/27/22 (!) 178/116  Most recent eGFR/CrCl:  Lab Results  Component Value Date   EGFR 62 11/18/2022    No components found for: "CRCL"  Reviewed medications with patient and discussed importance of compliance Advised patient, providing education and rationale, to monitor blood pressure daily and record, calling PCP for findings outside established parameters Reviewed scheduled/upcoming provider appointments including:  Advised patient to discuss concerns with provider Assessed social determinant of health barriers  Heart Failure Interventions:  (Status: Goal on Track (progressing): YES.)  Long Term Goal -Patient down #2, now #285 Wt Readings from Last 3 Encounters:  12/19/22 287 lb (130.2 kg)  12/05/22 288 lb (130.6 kg)  11/27/22 292 lb (132.5 kg)   Provided education on low sodium diet Discussed the importance of keeping all appointments with provider Provided  patient with education about the role of exercise in the management of heart failure Assessed social determinant of health barriers Advised patient to schedule follow up with Cardiology in June, or sooner if episodes of dizziness continue Advised patient to discuss any concerns or questions with Provider  Diabetes:  (Status: Goal on Track (progressing): YES.) Long Term  Goal   Lab Results  Component Value Date   HGBA1C 6.6 (H) 11/18/2022   @ Assessed patient's understanding of A1c goal: <7% Reviewed medications with patient and discussed importance of medication adherence;        Counseled on importance of regular laboratory monitoring as prescribed;        Provided patient with written educational materials related to hypo and hyperglycemia and importance of correct treatment;       Reviewed scheduled/upcoming provider appointments including: 04/23/23 with PCP;         Review of patient status, including review of consultants reports, relevant laboratory and other test results, and medications completed;        Patient Goals/Self-Care Activities: Take medications as prescribed   Attend all scheduled provider appointments Call provider office for new concerns or questions  Work with the social worker to address care coordination needs and will continue to work with the clinical team to address health care and disease management related needs use salt in moderation watch for swelling in feet, ankles and legs every day eat more whole grains, fruits and vegetables, lean meats and healthy fats dress right for the weather, hot or cold keep appointment with eye doctor check feet daily for cuts, sores or redness drink 6 to 8 glasses of water each day fill half of plate with vegetables switch to sugar-free drinks       Follow Up:  Patient agrees to Care Plan and Follow-up.  Plan: The Managed Medicaid care management team will reach out to the patient again over the next 60 days.  Date/time of next scheduled RN care management/care coordination outreach:  04/28/23 @ [place] RN, BSN [place]  [employer] manager

## 2023-02-19 NOTE — Patient Instructions (Signed)
Visit Information  Virginia Barron was given information about Medicaid Managed Care team care coordination services as a part of their Healthy [facility] Medicaid benefit. Virginia Barron verbally consented to engagement with the [facility] Managed Care team.   If you are experiencing a medical emergency, please call 911 or report to your local emergency department or urgent care.   If you have a non-emergency medical problem during routine business hours, please contact your provider's office and ask to speak with a nurse.   For questions related to your Healthy [facility] health plan, please call: [phone] or visit the homepage here: [email or web address]  If you would like to schedule transportation through your Healthy [facility] plan, please call the following number at least 2 days in advance of your appointment: [phone]  For information about your ride after you set it up, call Ride Assist at [phone]. Use this number to activate a Will Call pickup, or if your transportation is late for a scheduled pickup. Use this number, too, if you need to make a change or cancel a previously scheduled reservation.  If you need transportation services right away, call [phone]. The after-hours call center is staffed 24 hours to handle ride assistance and urgent reservation requests (including discharges) 365 days a year. Urgent trips include sick visits, hospital discharge requests and life-sustaining treatment.  Call the [place] at [phone], at any time, 24 hours a day, 7 days a week. If you are in danger or need immediate medical attention call 911.  If you would like help to quit smoking, call 1-800-QUIT-NOW (985) 644-5614) OR Espaol: 1-855-Djelo-Ya QO:409462) o para ms informacin haga clic aqu or Text READY to 200-400 to register via text  Virginia Barron,   Please see education materials related to falls, diabetes  and HF provided by MyChart link.  Patient verbalizes understanding of instructions and care plan provided today and agrees to view in [place]. Active MyChart status and patient understanding of how to access instructions and care plan via MyChart confirmed with patient.     Telephone follow up appointment with Managed Medicaid care management team member scheduled for:04/28/23 @ [place] RN, [place] RN Care Coordinator   Following is a copy of your plan of care:  Care Plan : RN Care Manager Plan of Care  Updates made by [name], RN since 02/19/2023 12:00 AM     Problem: Health Management needs related to CHF and DMII      Long-Range Goal: Development of Plan of Care to address Health Management needs related to CHF and DMII   Start Date: 11/26/2022  Expected End Date: 03/26/2023  Note:   Current Barriers:  Chronic Disease Management support and education needs related to CHF and DMII  Patient waiting on new BP monitor(new order was needed). She is having dizziness when going from a lying position to standing.     RNCM Clinical Goal(s):  Patient will verbalize understanding of plan for management of CHF and DMII as evidenced by patient reports take all medications exactly as prescribed and will call provider for medication related questions as evidenced by patient reports and EMR documentation    attend all scheduled medical appointments: 02/21/23 with LCSW, 03/14/23 with [facility] Pharmacist, 04/09/23 for Colon Cancer Screen and 04/23/23 with PCP as evidenced by provider documentation        continue to work with [place] and/or Social Worker to address care management and care  coordination needs related to CHF and DMII as evidenced by adherence to CM Team Scheduled appointments       Interventions: Inter-disciplinary care team collaboration (see longitudinal plan of care) Evaluation of current treatment plan related to  self management  and patient's adherence to plan as established by provider Provided patient with [facility] [phone], call for transportation needs in [place] provider notes and discussed with patient Provided patient with [place] [phone] Provided patient with fall precaution education    Hypertension Interventions:  (Status:  Goal on track:  Yes.) Long Term Goal Last practice recorded BP readings:  BP Readings from Last 3 Encounters:  12/19/22 130/80  12/05/22 (!) 140/80  11/27/22 (!) 178/116  Most recent eGFR/CrCl:  Lab Results  Component Value Date   EGFR 62 11/18/2022    No components found for: "CRCL"  Reviewed medications with patient and discussed importance of compliance Advised patient, providing education and rationale, to monitor blood pressure daily and record, calling PCP for findings outside established parameters Reviewed scheduled/upcoming provider appointments including:  Advised patient to discuss concerns with provider Assessed social determinant of health barriers  Heart Failure Interventions:  (Status: Goal on Track (progressing): YES.)  Long Term Goal -Patient down #2, now #285 Wt Readings from Last 3 Encounters:  12/19/22 287 lb (130.2 kg)  12/05/22 288 lb (130.6 kg)  11/27/22 292 lb (132.5 kg)   Provided education on low sodium diet Discussed the importance of keeping all appointments with provider Provided patient with education about the role of exercise in the management of heart failure Assessed social determinant of health barriers Advised patient to schedule follow up with Cardiology in June, or sooner if episodes of dizziness continue Advised patient to discuss any concerns or questions with Provider  Diabetes:  (Status: Goal on Track (progressing): YES.) Long Term Goal   Lab Results  Component Value Date   HGBA1C 6.6 (H) 11/18/2022   @ Assessed patient's understanding of A1c goal: <7% Reviewed medications with  patient and discussed importance of medication adherence;        Counseled on importance of regular laboratory monitoring as prescribed;        Provided patient with written educational materials related to hypo and hyperglycemia and importance of correct treatment;       Reviewed scheduled/upcoming provider appointments including: 04/23/23 with PCP;         Review of patient status, including review of consultants reports, relevant laboratory and other test results, and medications completed;        Patient Goals/Self-Care Activities: Take medications as prescribed   Attend all scheduled provider appointments Call provider office for new concerns or questions  Work with the social worker to address care coordination needs and will continue to work with the clinical team to address health care and disease management related needs use salt in moderation watch for swelling in feet, ankles and legs every day eat more whole grains, fruits and vegetables, lean meats and healthy fats dress right for the weather, hot or cold keep appointment with eye doctor check feet daily for cuts, sores or redness drink 6 to 8 glasses of water each day fill half of plate with vegetables switch to sugar-free drinks

## 2023-02-20 DIAGNOSIS — F39 Unspecified mood [affective] disorder: Secondary | ICD-10-CM | POA: Diagnosis not present

## 2023-02-20 DIAGNOSIS — I10 Essential (primary) hypertension: Secondary | ICD-10-CM | POA: Diagnosis not present

## 2023-02-21 ENCOUNTER — Other Ambulatory Visit: Payer: Medicaid Other | Admitting: Licensed Clinical Social Worker

## 2023-02-21 NOTE — Patient Outreach (Signed)
Medicaid Managed Care Social Work Note  [id number] Name:  Virginia Barron MRN:  [id number] DOB:  [id number]  Virginia Barron is an 62 y.o. year old female who is a primary patient of [name], Kirsten, MD.  The Medicaid Managed Care Coordination team was consulted for assistance with:  Virginia Barron and Resources  [name] was given information about Medicaid Managed Care Coordination team services today. Virginia Barron Patient agreed to services and verbal consent obtained.  Engaged with patient  for by telephone forfollow up visit in response to referral for case management and/or care coordination services.   Assessments/Interventions:  Review of past medical history, allergies, medications, health status, including review of consultants reports, laboratory and other test data, was performed as part of comprehensive evaluation and provision of chronic care management services.  SDOH: (Social Determinant of Health) assessments and interventions performed: SDOH Interventions    Flowsheet Row Patient Outreach Telephone from 02/21/2023 in [place] Patient Outreach Telephone from 02/19/2023 in [place] Patient Outreach Telephone from 01/24/2023 in [place] Patient Outreach Telephone from 01/06/2023 in [place] Patient Outreach Telephone from 12/16/2022 in [place] Patient Outreach Telephone from 12/12/2022 in [place]  SDOH Interventions        Transportation Interventions -- Payor Benefit, Other (Comment)  [Provided patient with [facility] [phone] -- -- -- Intervention Not Indicated  Stress Interventions Offered [employer], Provide Counseling -- Offered [employer], Provide Counseling Offered [employer], Provide Counseling Provide Counseling, Offered  Community Wellness Resources, Other (Comment)  [Starting services with [facility]] --       Advanced Directives Status:  See Care Plan for related entries.  Care Plan                 Allergies  Allergen Reactions   Avocado    Iodine Hives, Photosensitivity and Swelling   Kiwi Extract    Penicillins    Pork-Derived Products    Shellfish Allergy    Latex Hives, Itching, Rash and Swelling    Medications Reviewed Today     Reviewed by [name], RN (Registered Nurse) on 02/19/23 at [place] List Status: <None>   Medication Order Taking? Sig Documenting Provider Last Dose Status Informant  amLODipine (NORVASC) 10 MG tablet [id number] Yes Take 0.5 tablets (5 mg total) by mouth daily. Revankar, Reita Cliche, MD Taking Active   Blood Pressure Monitoring [facility]) MISC [id number] No 1 each by Does not apply route in the morning and at bedtime.  Patient not taking: Reported on 01/14/2023   [name], FNP Not Taking Active   cholecalciferol (VITAMIN D3) 25 MCG (1000 UNIT) tablet [id number] Yes Take 400 Units by mouth daily.  [provider] Taking Active   escitalopram (LEXAPRO) 10 MG tablet [id number] No Take 10 mg by mouth daily.  Patient not taking: Reported on 02/19/2023   [provider] Not Taking Active   metFORMIN (GLUCOPHAGE) 500 MG tablet [id number] Yes Take 1 tablet (500 mg total) by mouth daily with breakfast. [name], FNP Taking Active   Multiple Vitamin (MULTIVITAMIN) tablet [id number] Yes Take 1 tablet by mouth daily. [provider] Taking Active   rosuvastatin (CRESTOR) 5 MG tablet [id number] Yes Take 1 tablet (5 mg total) by mouth daily. [name], FNP Taking Active   triamcinolone acetonide (KENALOG-40) injection 80 mg [id number]   [name], MD  Active  valsartan-hydrochlorothiazide (DIOVAN-HCT) 320-25 MG tablet [id number] Yes Take 1 tablet by mouth daily. [name], FNP Taking Active             Patient Active Problem List    Diagnosis Date Noted   Type 2 diabetes mellitus without complication, without long-term current use of insulin ([place]) 12/05/2022   Encounter for screening mammogram for malignant neoplasm of breast 12/05/2022   Morbid obesity with body mass index (BMI) of 50.0 to 59.9 in adult ([place]) 11/26/2022   Primary osteoarthritis of left knee 11/18/2022   Chronic diastolic heart failure ([place]) 11/18/2022   Dyslipidemia associated with type 2 diabetes mellitus ([place]) 09/09/2021   Essential hypertension 08/15/2020   Fibroids    Urge incontinence 07/30/2020   OSA (obstructive sleep apnea) 07/30/2020   Morbid obesity with body mass index (BMI) of 45.0 to 49.9 in adult [facility]) 07/30/2020    Conditions to be addressed/monitored per PCP order:  Depression  Care Plan : LCSW Plan of Care  Updates made by Greg Cutter, LCSW since 02/21/2023 12:00 AM     Problem: Depression Identification (Depression)      Long-Range Goal: Depressive Symptoms Identified   Start Date: 11/21/2022  Note:   Priority: High  Timeframe:  Long-Range Goal Priority:  High Start Date:   11/21/22            Expected End Date:  GOAL ENDED                  Discharge date 02/21/23  - keep 90 percent of scheduled appointments -consider counseling or psychiatry -consider bumping up your self-care  -consider creating a stronger support network   Why is this important?             Combatting depression may take some time.            If you don't feel better right away, don't give up on your treatment plan.    Current barriers:   Chronic Mental Health needs related to depression and stress. Patient requires Support, Education, Resources, Referrals, Advocacy, and Care Coordination, in order to meet Unmet Mental Health Needs. Limited support network Patient will implement clinical interventions discussed today to decrease symptoms of depression and increase knowledge and/or ability of: coping skills. Mental Health Concerns and Social  Isolation Patient lacks knowledge of available community counseling agencies and resources.  Clinical Goal(s): verbalize understanding of plan for management of Depression, and Stress and demonstrate a reduction in symptoms. Patient will connect with a provider for ongoing mental health treatment, increase coping skills, healthy habits, self-management skills, and stress reduction        Clinical Interventions:  Assessed patient's previous and current treatment, coping skills, support system and barriers to care. Patient provided hx  Verbalization of feelings encouraged, motivational interviewing employed Emotional support provided, positive coping strategies explored. Establishing healthy boundaries emphasized and healthy self-care education provided Patient was educated on available mental health resources within their area that accept Medicaid and offer counseling and psychiatry. Patient reports significant worsening depression and stress impacting her ability to function appropriately and carry out daily task. Patient reports never having mental health support. 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 She reports that she has a close friend [name] that lives in [place] that she talks to on the phone to gain socialization.  Patient [facility]able [facility]. Patient prefers virtual counseling. [facility] LCSW made referral  on 11/21/22. UPDATE- Patient reports that she was able to establish contact with agency but she thinks her initial appointment with them was only with a psychiatrist but she will look through her paperwork and double check. Patient reports interest in gaining counseling as well. Email will be sent to [place] if needed during next social work outreach. UPDATE- Patient has been enrolled in [place] program. She has started psychiatry services only and will consider implementing counseling services as well. [facility] LCSW provided  education on basic self-care that she can use daily to prevent winter sickness and promote positive physical and mental health. 01/06/23 LCSW update- Patient reports that her next psychiatrist appointment with [place] is on 01/23/23. She reports that she remains on a wait list for counseling but does not wish for [facility] LCSW to check her status on this as she will question this herself during her next mental health appointment with [place]. [facility] LCSW provided brief emotional support and self-care education. [facility] LCSW Update for 01/24/23- Patient reports that she has been actively receiving care from [place] but has not yet stated counseling. Patient reports that she will check on getting counseling during her next psychiatry appointment. If patient has issues scheduling her initial counseling appointment then she had agreed to contact [facility] LCSW directly for assistance. Self-care education provided as well as crisis support resources. LCSW provided education on relaxation techniques such as meditation, deep breathing, massage, grounding exercises or yoga that can activate the body's relaxation response and ease symptoms of stress and anxiety. LCSW ask that when pt is struggling with difficult emotions and racing thoughts that they start this relaxation response process. LCSW provided extensive education on healthy coping skills for anxiety. SW used active and reflective listening, validated patient's feelings/concerns, and provided emotional support. Patient will work on implementing appropriate self-care habits into their daily routine such as: staying positive, writing a gratitude list, drinking water, staying active around the house, taking their medications as prescribed, combating negative thoughts or emotions and staying connected with their family and friends. Positive reinforcement provided for this decision to work on this. Motivational Interviewing employed Depression screen reviewed  PHQ2/ PHQ9 completed or reviewed   Mindfulness or Relaxation training provided Active listening / Reflection utilized  Advance Care and HCPOA education provided Emotional Support Provided Problem [place] strategies reviewed Provided psychoeducation for mental health needs  Provided brief CBT  Reviewed mental health medications and discussed importance of compliance:  Quality of sleep assessed & Sleep Hygiene techniques promoted  Participation in counseling encouraged  Verbalization of feelings encouraged  Suicidal Ideation/Homicidal Ideation assessed: Patient denies SI/HI  Review resources, discussed options and provided patient information about  [place] care team collaboration (see longitudinal plan of care) 02/21/23 LCSW Update- Patient reports that she is very satisfied with her current long term mental health providers at [facility]. She reports having both a psychiatrist and counselor. [facility] LCSW will sign off at this time.   Patient Goals/Self-Care Activities: Over the next 120 days Attend scheduled medical appointments Utilize healthy coping skills and supportive resources discussed Contact PCP with any questions or concerns Keep 90 percent of counseling appointments Call your insurance provider for more information about your Enhanced Benefits  Check out counseling resources provided  Begin personal counseling with LCSW, to reduce and manage symptoms of Depression and Stress, until well-established with mental health provider Accept all calls from representative with [facility] in an effort to establish ongoing mental health counseling and supportive  services. Incorporate into daily practice - relaxation techniques, deep breathing exercises, and mindfulness meditation strategies. Talk about feelings with friends, family members, spiritual advisor, etc. Contact LCSW directly [phone]), if you have questions, need assistance, or if additional social work  needs are identified between now and our next scheduled telephone outreach call. Call 988 for mental health hotline/crisis line if needed (24/7 available) Try techniques to reduce symptoms of anxiety/negative thinking (deep breathing, distraction, positive self talk, etc)  - develop a personal safety plan - develop a plan to deal with triggers like holidays, anniversaries - exercise at least 2 to 3 times per week - have a plan for how to handle bad days - journal feelings and what helps to feel better or worse - spend time or talk with others at least 2 to 3 times per week - watch for early signs of feeling worse - begin personal counseling - call and visit an old friend - check out volunteer opportunities - join a support group - laugh; watch a funny movie or comedian - learn and use visualization or guided imagery - perform a random act of kindness - practice relaxation or meditation daily - start or continue a personal journal - practice positive thinking and self-talk -continue with compliance of taking medication  -identify current effective and ineffective coping strategies.  -implement positive self-talk in care to increase self-esteem, confidence and feelings of control.  -consider alternative and complementary therapy approaches such as meditation, mindfulness or yoga.  -journaling, prayer, worship services, meditation or pastoral counseling.  -increase participation in pleasurable group activities such as hobbies, singing, sports or volunteering).  -consider the use of meditative movement therapy such as tai chi, yoga or qigong.  -start a regular daily exercise program based on tolerance, ability and patient choice to support positive thinking and activity       11/21/2022   10:07 AM 11/18/2022    3:42 PM 04/08/2021    7:58 PM 10/23/2020   12:08 PM  Depression screen PHQ 2/9  Decreased Interest 1 1 0 0  Down, Depressed, Hopeless 1 0 0 2  PHQ - 2 Score 2 1 0 2  Altered  sleeping 2 2  0  Tired, decreased energy 1 1  1   Change in appetite 0 0  2  Feeling bad or failure about yourself  0 0  0  Trouble concentrating 1 1  0  Moving slowly or fidgety/restless 0 0    Suicidal thoughts 0 0  1  PHQ-9 Score 6 5  6   Difficult doing work/chores Somewhat difficult Somewhat difficult  Somewhat difficult   10 LITTLE Things To Do When You're Feeling Too Down To Do Anything  Take a shower. Even if you plan to stay in all day long and not see a soul, take a shower. It takes the most effort to hop in to the shower but once you do, you'll feel immediate results. It will wake you up and you'll be feeling much fresher (and cleaner too).  Brush and floss your teeth. Give your teeth a good brushing with a floss finish. It's a small task but it feels so good and you can check 'taking care of your health' off the list of things to do.  Do something small on your list. Most of [place] have some small thing we would like to get done (load of laundry, sew a button, email a friend). Doing one of these things will make you feel like you've accomplished something.  Drink water. Drinking water is easy right? It's also really beneficial for your health so keep a glass beside you all day and take sips often. It gives you energy and prevents you from boredom eating.  Do some floor exercises. The last thing you want to do is exercise but it might be just the thing you need the most. Keep it simple and do exercises that involve sitting or laying on the floor. Even the smallest of exercises release chemicals in the brain that make you feel good. Yoga stretches or core exercises are going to make you feel good with minimal effort.  Make your bed. Making your bed takes a few minutes but it's productive and you'll feel relieved when it's done. An unmade bed is a huge visual reminder that you're having an unproductive day. Do it and consider it your housework for the day.  Put on some nice  clothes. Take the sweatpants off even if you don't plan to go anywhere. Put on clothes that make you feel good. Take a look in the mirror so your brain recognizes the sweatpants have been replaced with clothes that make you look great. It's an instant confidence booster.  Wash the dishes. A pile of dirty dishes in the sink is a reflection of your mood. It's possible that if you wash up the dishes, your mood will follow suit. It's worth a try.  Cook a real meal. If you have the luxury to have a "do nothing" day, you have time to make a real meal for yourself. Make a meal that you love to eat. The process is good to get you out of the funk and the food will ensure you have more energy for tomorrow.  Write out your thoughts by hand. When you hand write, you stimulate your brain to focus on the moment that you're in so make yourself comfortable and write whatever comes into your mind. Put those thoughts out on paper so they stop spinning around in your head. Those thoughts might be the very thing holding you down.  If you are experiencing a Mental Health or [place] or need someone to talk to, please call the Suicide and Crisis Lifeline: 988    Patient Goals: Discharge goal      Follow up:  Patient requests no follow-up at this time.   Eula Fried, BSW, MSW, [employer] Managed Medicaid [email or web address] [email or web address] Phone: [phone]

## 2023-02-21 NOTE — Patient Instructions (Signed)
Visit Information  Ms. Beal was given information about Medicaid Managed Care team care coordination services as a part of their Healthy [facility] Medicaid benefit. Gabrille Zemanek verbally consented to engagement with the [facility] Managed Care team.   If you are experiencing a medical emergency, please call 911 or report to your local emergency department or urgent care.   If you have a non-emergency medical problem during routine business hours, please contact your provider's office and ask to speak with a nurse.   For questions related to your Healthy [facility] health plan, please call: [phone] or visit the homepage here: [email or web address]  If you would like to schedule transportation through your Healthy [facility] plan, please call the following number at least 2 days in advance of your appointment: [phone]  For information about your ride after you set it up, call Ride Assist at [phone]. Use this number to activate a Will Call pickup, or if your transportation is late for a scheduled pickup. Use this number, too, if you need to make a change or cancel a previously scheduled reservation.  If you need transportation services right away, call [phone]. The after-hours call center is staffed 24 hours to handle ride assistance and urgent reservation requests (including discharges) 365 days a year. Urgent trips include sick visits, hospital discharge requests and life-sustaining treatment.  Call the [place] at [phone], at any time, 24 hours a day, 7 days a week. If you are in danger or need immediate medical attention call 911.  If you would like help to quit smoking, call 1-800-QUIT-NOW 631-418-1910) OR Espaol: 1-855-Djelo-Ya QO:409462) o para ms informacin haga clic aqu or Text READY to 200-400 to register via text  Following is a copy of your plan of care:  Care Plan : LCSW Plan of Care   Updates made by Greg Cutter, LCSW since 02/21/2023 12:00 AM     Problem: Depression Identification (Depression)      Long-Range Goal: Depressive Symptoms Identified   Start Date: 11/21/2022  Note:   Priority: High  Timeframe:  Long-Range Goal Priority:  High Start Date:   11/21/22            Expected End Date:  GOAL ENDED                  Discharge date 02/21/23  - keep 90 percent of scheduled appointments -consider counseling or psychiatry -consider bumping up your self-care  -consider creating a stronger support network   Why is this important?             Combatting depression may take some time.            If you don't feel better right away, don't give up on your treatment plan.    Current barriers:   Chronic Mental Health needs related to depression and stress. Patient requires Support, Education, Resources, Referrals, Advocacy, and Care Coordination, in order to meet Unmet Mental Health Needs. Limited support network Patient will implement clinical interventions discussed today to decrease symptoms of depression and increase knowledge and/or ability of: coping skills. Mental Health Concerns and Social Isolation Patient lacks knowledge of available community counseling agencies and resources.  Clinical Goal(s): verbalize understanding of plan for management of Depression, and Stress and demonstrate a reduction in symptoms. Patient will connect with a provider for ongoing mental health treatment, increase coping skills, healthy habits, self-management skills, and stress reduction        10 LITTLE Things To  Do When You're Feeling Too Down To Do Anything  Take a shower. Even if you plan to stay in all day long and not see a soul, take a shower. It takes the most effort to hop in to the shower but once you do, you'll feel immediate results. It will wake you up and you'll be feeling much fresher (and cleaner too).  Brush and floss your teeth. Give your teeth a good  brushing with a floss finish. It's a small task but it feels so good and you can check 'taking care of your health' off the list of things to do.  Do something small on your list. Most of [place] have some small thing we would like to get done (load of laundry, sew a button, email a friend). Doing one of these things will make you feel like you've accomplished something.  Drink water. Drinking water is easy right? It's also really beneficial for your health so keep a glass beside you all day and take sips often. It gives you energy and prevents you from boredom eating.  Do some floor exercises. The last thing you want to do is exercise but it might be just the thing you need the most. Keep it simple and do exercises that involve sitting or laying on the floor. Even the smallest of exercises release chemicals in the brain that make you feel good. Yoga stretches or core exercises are going to make you feel good with minimal effort.  Make your bed. Making your bed takes a few minutes but it's productive and you'll feel relieved when it's done. An unmade bed is a huge visual reminder that you're having an unproductive day. Do it and consider it your housework for the day.  Put on some nice clothes. Take the sweatpants off even if you don't plan to go anywhere. Put on clothes that make you feel good. Take a look in the mirror so your brain recognizes the sweatpants have been replaced with clothes that make you look great. It's an instant confidence booster.  Wash the dishes. A pile of dirty dishes in the sink is a reflection of your mood. It's possible that if you wash up the dishes, your mood will follow suit. It's worth a try.  Cook a real meal. If you have the luxury to have a "do nothing" day, you have time to make a real meal for yourself. Make a meal that you love to eat. The process is good to get you out of the funk and the food will ensure you have more energy for tomorrow.  Write out your  thoughts by hand. When you hand write, you stimulate your brain to focus on the moment that you're in so make yourself comfortable and write whatever comes into your mind. Put those thoughts out on paper so they stop spinning around in your head. Those thoughts might be the very thing holding you down.  If you are experiencing a Mental Health or [place] or need someone to talk to, please call the Suicide and Crisis Lifeline: 988    Patient Goals: Follow up goal

## 2023-03-07 DIAGNOSIS — G4733 Obstructive sleep apnea (adult) (pediatric): Secondary | ICD-10-CM | POA: Diagnosis not present

## 2023-03-13 DIAGNOSIS — F419 Anxiety disorder, unspecified: Secondary | ICD-10-CM | POA: Diagnosis not present

## 2023-03-14 ENCOUNTER — Other Ambulatory Visit: Payer: Medicaid Other | Admitting: Pharmacist

## 2023-03-14 ENCOUNTER — Telehealth: Payer: Self-pay

## 2023-03-14 DIAGNOSIS — I10 Essential (primary) hypertension: Secondary | ICD-10-CM | POA: Diagnosis not present

## 2023-03-14 DIAGNOSIS — R0602 Shortness of breath: Secondary | ICD-10-CM | POA: Diagnosis not present

## 2023-03-14 DIAGNOSIS — W19XXXA Unspecified fall, initial encounter: Secondary | ICD-10-CM | POA: Diagnosis not present

## 2023-03-14 DIAGNOSIS — R0902 Hypoxemia: Secondary | ICD-10-CM | POA: Diagnosis not present

## 2023-03-14 DIAGNOSIS — R519 Headache, unspecified: Secondary | ICD-10-CM | POA: Diagnosis not present

## 2023-03-14 DIAGNOSIS — M1711 Unilateral primary osteoarthritis, right knee: Secondary | ICD-10-CM | POA: Diagnosis not present

## 2023-03-14 DIAGNOSIS — E119 Type 2 diabetes mellitus without complications: Secondary | ICD-10-CM | POA: Diagnosis not present

## 2023-03-14 DIAGNOSIS — G4489 Other headache syndrome: Secondary | ICD-10-CM | POA: Diagnosis not present

## 2023-03-14 DIAGNOSIS — Z20822 Contact with and (suspected) exposure to covid-19: Secondary | ICD-10-CM | POA: Diagnosis not present

## 2023-03-14 DIAGNOSIS — F32A Depression, unspecified: Secondary | ICD-10-CM | POA: Diagnosis not present

## 2023-03-14 DIAGNOSIS — R9431 Abnormal electrocardiogram [ECG] [EKG]: Secondary | ICD-10-CM | POA: Diagnosis not present

## 2023-03-14 DIAGNOSIS — R0600 Dyspnea, unspecified: Secondary | ICD-10-CM | POA: Diagnosis not present

## 2023-03-14 NOTE — Telephone Encounter (Signed)
FELL ON 03/22- PT FEEL LAST WEEK. DENIES HITTING HER HEAD. BUT SHE DID HIT HER KNEE. SINCE THEN SHE HAS HAD SOME DIZZINESS. I spoke with Leotis Shames, who recommended for the patient to go to the emergency department to be seen being that we do not have any openings for today and she does not need to wait to be seen. Patient notified. Patient stated that she does not have transportation and does not want to wait at the hospital to be seen. Patient notified that she will need to call 911 so she can be transported to the hospital. Patient stated that she doesn't want to wait at the hospital to be seen. The call was transferred to Lauren, CMA for her to speak with.

## 2023-03-14 NOTE — Progress Notes (Signed)
03/14/2023 Name: Virginia Barron MRN: [id number] DOB: 08/09/1961  Chief Complaint  Patient presents with   Medication Management   Hypertension   Diabetes   Hyperlipidemia    Virginia Barron is a 62 y.o. year old female who presented for a telephone visit.   They were referred to the pharmacist by a quality report for assistance in managing diabetes, hypertension, and hyperlipidemia.   Patient is participating in a Managed Medicaid Plan:  Yes  Subjective:  Care Team: Primary Care Provider: Blane Oharaox, Kirsten, MD ; Next Scheduled Visit: 04/23/23  Medication Access/Adherence  Current [facility]  [facility] [place] [address] [place] [id number] Phone: [phone] Fax: [phone]   Patient reports affordability concerns with their medications: No  Patient reports access/transportation concerns to their pharmacy: No  Patient reports adherence concerns with their medications:  No    Notes that she has been having some periodic dizziness. Reports periodic episodes when she first wakes up in the morning. Notes she fell when walking to the bus stop to pick up her grandson, is unsure if she was dizzy or tripped, just reports she fell. She landed on her R knee.   During our conversation, she noted that her dizziness was periodic, specifically noted when she first got up in the morning. Appears she reported that the dizziness started after her fall when called by clinic CMA. She denied shortness of breath when she and I talked. She has been instructed by the clinic to go to the ER for evaluation.   Diabetes:  Current medications: metformin 500 mg daily  Hypertension:  Current medications: amlodipine 5 mg daily, valsartan/HCTZ 320/25 mg daily   Patient has a validated, automated, upper arm home BP cuff  but notes she needs a larger cuff for the machine. She has an older meter than has been reading 130-140s systolic, unclear if  accurate  Does note some dizziness when she first wakes up in the morning.   Hyperlipidemia/ASCVD Risk Reduction  Current lipid lowering medications: rosuvastatin 5 mg daily  Objective:  Lab Results  Component Value Date   HGBA1C 6.6 (H) 11/18/2022    Lab Results  Component Value Date   CREATININE 1.02 (H) 11/18/2022   BUN 15 11/18/2022   NA 144 11/18/2022   K 3.9 11/18/2022   CL 106 11/18/2022   CO2 19 (L) 11/18/2022    Lab Results  Component Value Date   CHOL 143 12/05/2022   HDL 45 12/05/2022   LDLCALC 87 12/05/2022   TRIG 53 12/05/2022   CHOLHDL 3.2 12/05/2022    Medications Reviewed Today     Reviewed by Alden HippHarper, Montie Swiderski T, RPH-CPP (Pharmacist) on 03/14/23 at [phone]  Med List Status: <None>   Medication Order Taking? Sig Documenting Provider Last Dose Status Informant  amLODipine (NORVASC) 10 MG tablet [id number] Yes Take 0.5 tablets (5 mg total) by mouth daily. Revankar, Aundra Dubinajan R, MD Taking Active   cholecalciferol (VITAMIN D3) 25 MCG (1000 UNIT) tablet [id number]  Take 400 Units by mouth daily.  [provider]  Active   FLUoxetine (PROZAC) 40 MG capsule [id number] Yes Take 40 mg by mouth daily. [provider] Taking Active   metFORMIN (GLUCOPHAGE) 500 MG tablet [id number] Yes Take 1 tablet (500 mg total) by mouth daily with breakfast. Lurline DelAryal, Rekha, FNP Taking Active   Multiple Vitamin (MULTIVITAMIN) tablet [id number]  Take 1 tablet by mouth daily. [provider]  Active   rosuvastatin (CRESTOR)  5 MG tablet [id number] Yes Take 1 tablet (5 mg total) by mouth daily. Lurline Del, FNP Taking Active   triamcinolone acetonide (KENALOG-40) injection 80 mg [id number]   Blane Ohara, MD  Consider Medication Status and Discontinue (Completed Course)   valsartan-hydrochlorothiazide (DIOVAN-HCT) 320-25 MG tablet [id number] Yes Take 1 tablet by mouth daily. Lurline Del, FNP Taking Active               Assessment/Plan:   Diabetes: - Currently  controlled - Recommend to continue current regimen at this time  Hypertension: - Currently controlled per last office reading, but unclear home control. Unclear if dizziness related to orthostatic hypotension. Discussed recommendation for patient to be seen for dizziness and fall. Patient is in agreement. Contacted the clinic to help facilitate.  - Will contact [facility] to facilitate ordering a larger cuff size for patient's home BP machine  Hyperlipidemia/ASCVD Risk Reduction: - Currently uncontrolled but anticipate improvement with upcoming lab visit - Recommend to continue current regimen at this time  Follow Up Plan: will call next week.  Catie Eppie Gibson, PharmD, BCACP, CPP [facility] [phone]

## 2023-03-14 NOTE — Telephone Encounter (Signed)
Spoke with patient and patient stated that she had fallen on 3/22 and ever since she has had shortness of breath with exertion and has been having dizziness all day long. Patient informed with her history and her symptoms and the recent fall she needs to go to the ER.Also told patient we have no current openings and this did not need to wait. Told patient she could have possible blood clot. Patient states she did not have anyone to take her to the ER, and she did not want to wait for forever to be seen. Told the patient I'm sorry for the bad experiences she has had in the past but regardless she still needs to go to the ER to be seen. Told patient to call 911 for the ambulance to come get her because she is having the Shortness of breath and the dizziness and no transportation. Patient stated that she would call them to come get her to take her to ER.

## 2023-03-17 ENCOUNTER — Telehealth: Payer: Self-pay | Admitting: *Deleted

## 2023-03-17 ENCOUNTER — Other Ambulatory Visit: Payer: Medicaid Other | Admitting: Pharmacist

## 2023-03-17 NOTE — Transitions of Care (Post Inpatient/ED Visit) (Signed)
   03/17/2023  Name: Virginia Barron MRN: [id number] DOB: Jul 26, 1961  Today's TOC FU Call Status: Today's TOC FU Call Status:: Unsuccessul Call (1st Attempt) Unsuccessful Call (1st Attempt) Date: 03/17/23  Attempted to reach the patient regarding the most recent Inpatient/ED visit.  Follow Up Plan: Additional outreach attempts will be made to reach the patient to complete the Transitions of Care (Post Inpatient/ED visit) call.   Estanislado Emms RN, BSN [place]  Managed [facility] RN Care Coordinator [phone]

## 2023-03-19 ENCOUNTER — Telehealth: Payer: Self-pay | Admitting: *Deleted

## 2023-03-19 NOTE — Transitions of Care (Post Inpatient/ED Visit) (Signed)
   03/19/2023  Name: Euline Ripp MRN: [id number] DOB: 1961/04/06  Today's TOC FU Call Status: Today's TOC FU Call Status:: Unsuccessful Call (2nd Attempt) Unsuccessful Call (2nd Attempt) Date: 03/19/23  Attempted to reach the patient regarding the most recent Inpatient/ED visit.  Follow Up Plan: Additional outreach attempts will be made to reach the patient to complete the Transitions of Care (Post Inpatient/ED visit) call.   Estanislado Emms RN, BSN [place]  Managed [facility] RN Care Coordinator [phone]

## 2023-03-20 ENCOUNTER — Other Ambulatory Visit: Payer: Medicaid Other | Admitting: Pharmacist

## 2023-03-20 ENCOUNTER — Telehealth: Payer: Self-pay | Admitting: *Deleted

## 2023-03-20 ENCOUNTER — Encounter: Payer: Self-pay | Admitting: Family Medicine

## 2023-03-20 DIAGNOSIS — F419 Anxiety disorder, unspecified: Secondary | ICD-10-CM | POA: Diagnosis not present

## 2023-03-20 MED ORDER — LANCET DEVICE MISC: 2 refills | Status: AC

## 2023-03-20 MED ORDER — BLOOD GLUCOSE TEST VI STRP: ORAL_STRIP | 3 refills | Status: DC

## 2023-03-20 MED ORDER — LANCETS MISC. MISC: 2 refills | Status: AC

## 2023-03-20 MED ORDER — BLOOD GLUCOSE MONITORING SUPPL DEVI: 0 refills | Status: AC

## 2023-03-20 NOTE — Progress Notes (Signed)
Care Coordination Call  Contacted patient to follow up on ED visit. Notes she did present to the [facility] ED; review of ED note does not show any acute issues identified. She notes that she continues to periodically have a sensation like a rubber band is around her head. It is not constant, and she has been unable to identify any specific aggravating or relieving factors. Shortness of breath has resolved since last week.  She asks for a glucometer prescription today to be able to periodically check glucose when she feels poorly. Glucose was normal in the ED.   I coordinated with [facility]; they will be refaxing the order form for a BP machine to the office for physician signature.   Will coordinate with PCP about recommendations for next steps. Collaborated with RN CM; patient will be available for TOC call after 11 am today.   Catie Eppie Gibson, PharmD, BCACP, CPP [facility] [phone]

## 2023-03-20 NOTE — Transitions of Care (Post Inpatient/ED Visit) (Signed)
   03/20/2023  Name: Virginia Barron MRN: [id number] DOB: Mar 19, 1961  Today's TOC FU Call Status: Today's TOC FU Call Status:: Successful TOC FU Call Competed TOC FU Call Complete Date: 03/20/23  Transition Care Management Follow-up Telephone Call Reason for ED Visit: Other: (Headache) How have you been since you were released from the hospital?: Better Any questions or concerns?: No  Items Reviewed: Did you receive and understand the discharge instructions provided?: Yes Medications obtained and verified?: Yes (Medications Reviewed) Any new allergies since your discharge?: No Dietary orders reviewed?: NA Do you have support at home?: Yes People in Home: child(ren), adult Name of Support/Comfort Primary Source: Son/Michael  Home Care and Equipment/Supplies: Were Home Health Services Ordered?: NA Any new equipment or medical supplies ordered?: NA  Functional Questionnaire: Do you need assistance with bathing/showering or dressing?: No Do you need assistance with meal preparation?: No Do you need assistance with eating?: No Do you have difficulty maintaining continence: No Do you need assistance with getting out of bed/getting out of a chair/moving?: No Do you have difficulty managing or taking your medications?: No  Follow up appointments reviewed: PCP Follow-up appointment confirmed?: Yes Date of PCP follow-up appointment?: 04/23/23 Follow-up Provider: Dr. Sedalia Muta Specialist [facility] Follow-up appointment confirmed?: NA Do you need transportation to your follow-up appointment?: No Do you understand care options if your condition(s) worsen?: Yes-patient verbalized understanding  SDOH Interventions Today    Flowsheet Row Most Recent Value  SDOH Interventions   Transportation Interventions Payor Benefit       Estanislado Emms RN, BSN [place]  Managed [facility] RN Care Coordinator [phone]

## 2023-03-23 ENCOUNTER — Other Ambulatory Visit: Payer: Self-pay | Admitting: Family Medicine

## 2023-03-23 DIAGNOSIS — E1169 Type 2 diabetes mellitus with other specified complication: Secondary | ICD-10-CM

## 2023-03-23 MED ORDER — BLOOD GLUCOSE TEST VI STRP: 1.0000 | ORAL_STRIP | Freq: Three times a day (TID) | 3 refills | Status: AC

## 2023-03-23 MED ORDER — LANCET DEVICE MISC: 1.0000 | Freq: Three times a day (TID) | 0 refills | Status: AC

## 2023-03-23 MED ORDER — LANCETS MISC. MISC: 1.0000 | Freq: Three times a day (TID) | 3 refills | Status: AC

## 2023-03-23 MED ORDER — BLOOD GLUCOSE MONITORING SUPPL DEVI: 1.0000 | Freq: Three times a day (TID) | 0 refills | Status: DC

## 2023-03-24 NOTE — Progress Notes (Signed)
Care Coordination Call  Called patient back. Notes she is still having some dizziness. Just picked up glucometer and will check blood sugars. Notes she did recently increase fluoxetine dose per psychiatry; they have just decreased back to 20 mg daily.   Offered support in scheduling office visit. She declines, notes that she will check her sugars and see if fluoxetine dose decrease helps. If worsening in symptoms, she will call the office to be seen acutely or seek urgent care. She verbalizes understanding.   Catie Eppie Gibson, PharmD, BCACP, CPP [facility] [phone]

## 2023-03-27 DIAGNOSIS — F419 Anxiety disorder, unspecified: Secondary | ICD-10-CM | POA: Diagnosis not present

## 2023-03-31 ENCOUNTER — Ambulatory Visit: Payer: Medicaid Other | Admitting: Podiatry

## 2023-03-31 DIAGNOSIS — L84 Corns and callosities: Secondary | ICD-10-CM

## 2023-03-31 DIAGNOSIS — M79675 Pain in left toe(s): Secondary | ICD-10-CM | POA: Diagnosis not present

## 2023-03-31 DIAGNOSIS — E1142 Type 2 diabetes mellitus with diabetic polyneuropathy: Secondary | ICD-10-CM

## 2023-03-31 DIAGNOSIS — B351 Tinea unguium: Secondary | ICD-10-CM

## 2023-03-31 DIAGNOSIS — B353 Tinea pedis: Secondary | ICD-10-CM | POA: Diagnosis not present

## 2023-03-31 DIAGNOSIS — M79674 Pain in right toe(s): Secondary | ICD-10-CM

## 2023-03-31 MED ORDER — KETOCONAZOLE 2 % EX CREA: 1.0000 | TOPICAL_CREAM | Freq: Every day | CUTANEOUS | 0 refills | Status: DC

## 2023-03-31 NOTE — Progress Notes (Signed)
Subjective:  Patient ID: Virginia Barron, female    DOB: 1961/08/17,  MRN: [id number]  Chief Complaint  Patient presents with   Foot Problem    Dyslipidemia associated with type 2 diabetes mellitus, right foot has discoloration on all toes, no prior treatment    62 y.o. female presents with concern for dry skin discoloration of the nails and pain due to the length the nails.  She does have a history of type 2 diabetes with some neuropathy.  She takes metformin does not take insulin.  She has difficulty trimming the nails.  Also notes red rash and dry skin.  Past Medical History:  Diagnosis Date   Chronic diastolic heart failure (HCC) 11/18/2022   Dyslipidemia associated with type 2 diabetes mellitus (HCC) 09/09/2021   Essential hypertension 08/15/2020   Fibroids    Morbid obesity with body mass index (BMI) of 45.0 to 49.9 in adult [facility]) 07/30/2020   Morbid obesity with body mass index (BMI) of 50.0 to 59.9 in adult (HCC) 07/30/2020   OSA (obstructive sleep apnea) 07/30/2020   Primary osteoarthritis of left knee 11/18/2022   Urge incontinence 07/30/2020    Allergies  Allergen Reactions   Avocado    Iodine Hives, Photosensitivity and Swelling   Kiwi Extract    Penicillins    Pork-Derived Products    Shellfish Allergy    Latex Hives, Itching, Rash and Swelling    ROS: Negative except as per HPI above  Objective:  General: AAO x3, NAD  Dermatological: Onychomycosis of all nails with thickening dystrophy discoloration and pain on palpation x 5 bilateral foot.  Also with preulcerative hyperkeratotic lesion at the lateral aspect of the right fifth digit.  Xerosis of the skin with red rash present in moccasin distribution bilateral plantar foot  Vascular:  Dorsalis Pedis artery and Posterior Tibial artery pedal pulses are 2/4 bilateral.  Capillary fill time < 3 sec to all digits.   Neruologic: Grossly diminished via light touch bilaterally  Musculoskeletal: No gross boney pedal  deformities bilateral. No pain, crepitus, or limitation noted with foot and ankle range of motion bilateral. Muscular strength 5/5 in all groups tested bilateral.  Gait: Unassisted, Nonantalgic.   No images are attached to the encounter.   Assessment:   1. Pain due to onychomycosis of toenails of both feet   2. Pre-ulcerative calluses   3. Tinea pedis of both feet   4. DM type 2 with diabetic peripheral neuropathy      Plan:  Patient was evaluated and treated and all questions answered.  DM2 with neuropathy Patient educated on diabetes. Discussed proper diabetic foot care and discussed risks and complications of disease. Educated patient in depth on reasons to return to the office immediately should he/she discover anything concerning or new on the feet. All questions answered. Discussed proper shoes as well.   # Tinea pedis bilateral Discussed the etiology and treatment options for tinea pedis.  Discussed topical and oral treatment.  Recommended topical treatment with 2% ketoconazole cream.  This was sent to the patient's pharmacy.  Also discussed appropriate foot hygiene, use of antifungal spray such as Tinactin in shoes, as well as cleaning her foot surfaces such as showers and bathroom floors with bleach.   #Hyperkeratotic lesions/pre ulcerative calluses present lateral aspect of the right fifth toe All symptomatic hyperkeratoses x 1 separate lesions were safely debrided with a sterile #10 blade to patient's level of comfort without incident. We discussed preventative and palliative care of these lesions including  supportive and accommodative shoegear, padding, prefabricated and custom molded accommodative orthoses, use of a pumice stone and lotions/creams daily.  #Onychomycosis with pain  -Nails palliatively debrided as below. -Educated on self-care  Procedure: Nail Debridement Rationale: Pain Type of Debridement: manual, sharp debridement. Instrumentation: Nail nipper,  rotary burr. Number of Nails: 10  Return in about 3 months (around 06/30/2023) for [facility].          Corinna Gab, DPM [facility]

## 2023-04-07 DIAGNOSIS — G4733 Obstructive sleep apnea (adult) (pediatric): Secondary | ICD-10-CM | POA: Diagnosis not present

## 2023-04-14 DIAGNOSIS — M25562 Pain in left knee: Secondary | ICD-10-CM | POA: Diagnosis not present

## 2023-04-14 DIAGNOSIS — M79642 Pain in left hand: Secondary | ICD-10-CM | POA: Diagnosis not present

## 2023-04-16 ENCOUNTER — Encounter: Payer: Self-pay | Admitting: Family Medicine

## 2023-04-17 DIAGNOSIS — F419 Anxiety disorder, unspecified: Secondary | ICD-10-CM | POA: Diagnosis not present

## 2023-04-22 NOTE — Assessment & Plan Note (Signed)
>>  ASSESSMENT AND PLAN FOR TYPE 2 DIABETES MELLITUS WITHOUT COMPLICATION, WITHOUT LONG-TERM CURRENT USE OF INSULIN (HCC) WRITTEN ON 04/22/2023  9:16 PM BY LEAL-BORJAS, Verdine Grenfell I, CMA  Control:  Recommend check sugars fasting daily. Recommend check feet daily. Recommend annual eye exams. Medicines: Metformin 500 mg daily Continue to work on eating a healthy diet and exercise.  Labs drawn today.

## 2023-04-22 NOTE — Assessment & Plan Note (Signed)
Well-controlled.  Continue oxybutynin 5 mg once daily. 
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
## 2023-04-22 NOTE — Assessment & Plan Note (Signed)
Control:  Recommend check sugars fasting daily. Recommend check feet daily. Recommend annual eye exams. Medicines: Metformin 500 mg daily Continue to work on eating a healthy diet and exercise.  Labs drawn today.

## 2023-04-22 NOTE — Assessment & Plan Note (Signed)
Well controlled.  No changes to medicines. Valsartan-Hctz 320-25 mg daily, amlodipine 10 mg daily. Continue to work on eating a healthy diet and exercise.  Labs drawn today.

## 2023-04-22 NOTE — Assessment & Plan Note (Signed)
The current medical regimen is effective;  continue present plan and medications.  

## 2023-04-23 ENCOUNTER — Other Ambulatory Visit: Payer: Self-pay | Admitting: Family Medicine

## 2023-04-23 ENCOUNTER — Ambulatory Visit (INDEPENDENT_AMBULATORY_CARE_PROVIDER_SITE_OTHER): Payer: Medicaid Other | Admitting: Family Medicine

## 2023-04-23 VITALS — BP 130/78 | HR 81 | Temp 97.5°F | Ht 65.0 in | Wt 282.2 lb

## 2023-04-23 DIAGNOSIS — I10 Essential (primary) hypertension: Secondary | ICD-10-CM | POA: Diagnosis not present

## 2023-04-23 DIAGNOSIS — R5383 Other fatigue: Secondary | ICD-10-CM | POA: Diagnosis not present

## 2023-04-23 DIAGNOSIS — R0602 Shortness of breath: Secondary | ICD-10-CM

## 2023-04-23 DIAGNOSIS — E119 Type 2 diabetes mellitus without complications: Secondary | ICD-10-CM | POA: Diagnosis not present

## 2023-04-23 DIAGNOSIS — I5032 Chronic diastolic (congestive) heart failure: Secondary | ICD-10-CM | POA: Diagnosis not present

## 2023-04-23 DIAGNOSIS — N3941 Urge incontinence: Secondary | ICD-10-CM | POA: Diagnosis not present

## 2023-04-23 NOTE — Patient Instructions (Signed)
Get chest xray

## 2023-04-24 ENCOUNTER — Other Ambulatory Visit: Payer: Self-pay

## 2023-04-24 DIAGNOSIS — R944 Abnormal results of kidney function studies: Secondary | ICD-10-CM

## 2023-04-24 DIAGNOSIS — F419 Anxiety disorder, unspecified: Secondary | ICD-10-CM | POA: Diagnosis not present

## 2023-04-24 LAB — COMPREHENSIVE METABOLIC PANEL
ALT: 13 IU/L (ref 0–32)
AST: 16 IU/L (ref 0–40)
Albumin/Globulin Ratio: 1.5 (ref 1.2–2.2)
Albumin: 4.3 g/dL (ref 3.9–4.9)
Alkaline Phosphatase: 54 IU/L (ref 44–121)
BUN/Creatinine Ratio: 17 (ref 12–28)
BUN: 22 mg/dL (ref 8–27)
Bilirubin Total: 0.3 mg/dL (ref 0.0–1.2)
CO2: 22 mmol/L (ref 20–29)
Calcium: 9.6 mg/dL (ref 8.7–10.3)
Chloride: 101 mmol/L (ref 96–106)
Creatinine, Ser: 1.29 mg/dL — ABNORMAL HIGH (ref 0.57–1.00)
Globulin, Total: 2.8 g/dL (ref 1.5–4.5)
Glucose: 98 mg/dL (ref 70–99)
Potassium: 3.6 mmol/L (ref 3.5–5.2)
Sodium: 140 mmol/L (ref 134–144)
Total Protein: 7.1 g/dL (ref 6.0–8.5)
eGFR: 47 mL/min/{1.73_m2} — ABNORMAL LOW (ref >59)

## 2023-04-24 LAB — CBC WITH DIFFERENTIAL/PLATELET
Basophils Absolute: 0.1 10*3/uL (ref 0.0–0.2)
Basos: 1 %
EOS (ABSOLUTE): 0.2 10*3/uL (ref 0.0–0.4)
Eos: 2 %
Hematocrit: 41.2 % (ref 34.0–46.6)
Hemoglobin: 12.6 g/dL (ref 11.1–15.9)
Immature Grans (Abs): 0 10*3/uL (ref 0.0–0.1)
Immature Granulocytes: 0 %
Lymphocytes Absolute: 1.9 10*3/uL (ref 0.7–3.1)
Lymphs: 22 %
MCH: 24 pg — ABNORMAL LOW (ref 26.6–33.0)
MCHC: 30.6 g/dL — ABNORMAL LOW (ref 31.5–35.7)
MCV: 79 fL (ref 79–97)
Monocytes Absolute: 0.8 10*3/uL (ref 0.1–0.9)
Monocytes: 9 %
Neutrophils Absolute: 5.7 10*3/uL (ref 1.4–7.0)
Neutrophils: 66 %
Platelets: 316 10*3/uL (ref 150–450)
RBC: 5.25 x10E6/uL (ref 3.77–5.28)
RDW: 13.2 % (ref 11.7–15.4)
WBC: 8.6 10*3/uL (ref 3.4–10.8)

## 2023-04-24 LAB — LIPID PANEL
Chol/HDL Ratio: 2.4 ratio (ref 0.0–4.4)
Cholesterol, Total: 94 mg/dL — ABNORMAL LOW (ref 100–199)
HDL: 40 mg/dL (ref >39)
LDL Chol Calc (NIH): 40 mg/dL (ref 0–99)
Triglycerides: 64 mg/dL (ref 0–149)
VLDL Cholesterol Cal: 14 mg/dL (ref 5–40)

## 2023-04-24 LAB — HEMOGLOBIN A1C
Est. average glucose Bld gHb Est-mCnc: 128 mg/dL
Hgb A1c MFr Bld: 6.1 % — ABNORMAL HIGH (ref 4.8–5.6)

## 2023-04-24 LAB — TSH: TSH: 1.59 u[IU]/mL (ref 0.450–4.500)

## 2023-04-24 LAB — MICROALBUMIN / CREATININE URINE RATIO
Creatinine, Urine: 326.5 mg/dL
Microalb/Creat Ratio: 6 mg/g creat (ref 0–29)
Microalbumin, Urine: 18.5 ug/mL

## 2023-04-24 LAB — CARDIOVASCULAR RISK ASSESSMENT

## 2023-04-24 LAB — PRO B NATRIURETIC PEPTIDE: NT-Pro BNP: <36 pg/mL (ref 0–287)

## 2023-04-25 ENCOUNTER — Other Ambulatory Visit: Payer: Self-pay

## 2023-04-25 ENCOUNTER — Encounter: Payer: Self-pay | Admitting: Family Medicine

## 2023-04-25 DIAGNOSIS — R899 Unspecified abnormal finding in specimens from other organs, systems and tissues: Secondary | ICD-10-CM

## 2023-04-27 ENCOUNTER — Encounter: Payer: Self-pay | Admitting: Family Medicine

## 2023-04-28 ENCOUNTER — Encounter: Payer: Self-pay | Admitting: *Deleted

## 2023-04-28 ENCOUNTER — Other Ambulatory Visit: Payer: Medicaid Other | Admitting: *Deleted

## 2023-04-28 NOTE — Patient Outreach (Signed)
Medicaid Managed Care   Nurse Care Manager Note  04/28/2023 Name:  Virginia Barron MRN:  [id number] DOB:  11-30-1961  Virginia Barron is an 62 y.o. year old female who is a primary patient of [name], Fritzi Mandes, MD.  The Medicaid Managed Care Coordination team was consulted for assistance with:    HTN DMII  Virginia Barron was given information about Medicaid Managed Care Coordination team services today. Virginia Barron Patient agreed to services and verbal consent obtained.  Engaged with patient by telephone for follow up visit in response to provider referral for case management and/or care coordination services.   Assessments/Interventions:  Review of past medical history, allergies, medications, health status, including review of consultants reports, laboratory and other test data, was performed as part of comprehensive evaluation and provision of chronic care management services.  SDOH (Social Determinants of Health) assessments and interventions performed: SDOH Interventions    Flowsheet Row Telephone from 03/20/2023 in [place] POPULATION HEALTH DEPARTMENT Patient Outreach Telephone from 02/21/2023 in [place] POPULATION HEALTH DEPARTMENT Patient Outreach Telephone from 02/19/2023 in [place] POPULATION HEALTH DEPARTMENT Patient Outreach Telephone from 01/24/2023 in [place] POPULATION HEALTH DEPARTMENT Patient Outreach Telephone from 01/06/2023 in [place] POPULATION HEALTH DEPARTMENT Patient Outreach Telephone from 12/16/2022 in [place] POPULATION HEALTH DEPARTMENT  SDOH Interventions        Transportation Interventions Payor Benefit -- Payor Benefit, Other (Comment)  [Provided patient with [facility] [phone] -- -- --  Stress Interventions -- [employer], Provide Counseling -- Offered [employer], Provide Counseling Offered [employer], Provide Counseling Provide Counseling, Offered [employer], Other  (Comment)  [Starting services with [facility]]       Care Plan  Allergies  Allergen Reactions   Avocado    Iodine Hives, Photosensitivity and Swelling   Kiwi Extract    Penicillins    Pork-Derived Products    Shellfish Allergy    Latex Hives, Itching, Rash and Swelling    Medications Reviewed Today     Reviewed by Heidi Dach, RN (Registered Nurse) on 04/28/23 at [phone]  Med List Status: <None>   Medication Order Taking? Sig Documenting Provider Last Dose Status Informant  acetaminophen (TYLENOL) 500 MG tablet [id number]  Take 500 mg by mouth every 6 (six) hours as needed. [provider]  Active   amLODipine (NORVASC) 10 MG tablet [id number] Yes Take 0.5 tablets (5 mg total) by mouth daily. RevankarAundra Dubin, MD Taking Active   aspirin EC 325 MG tablet [id number] Yes Take 325 mg by mouth daily. [provider] Taking Active   Blood Glucose Monitoring Suppl DEVI [id number] Yes Use to check glucose up to twice daily. May substitute to any manufacturer covered by patient's insurance. Blane Ohara, MD Taking Active   Blood Glucose Monitoring Suppl DEVI [id number] Yes 1 each by Does not apply route in the morning, at noon, and at bedtime. May substitute to any manufacturer covered by patient's insurance. [name], Kirsten, MD Taking Active   cholecalciferol (VITAMIN D3) 25 MCG (1000 UNIT) tablet [id number] Yes Take 400 Units by mouth daily.  [provider] Taking Active   FLUoxetine (PROZAC) 20 MG capsule [id number] Yes Take 20 mg by mouth daily. [provider] Taking Active   Glucose Blood (BLOOD GLUCOSE TEST STRIPS) STRP [id number] Yes Use to check glucose up to twice daily. May substitute to any manufacturer covered by patient's insurance. [name], Kirsten, MD Taking Active   ketoconazole (NIZORAL) 2 % cream [id number] Yes Apply  1 Application topically daily. Pilar Plate, [place] Taking Active   Lancet Device MISC [id number] Yes Use to check glucose up to twice  daily. May substitute to any manufacturer covered by patient's insurance. CoxFritzi Mandes, MD Taking Active   Lancets Misc. MISC [id number] Yes Use to check glucose up to twice daily. May substitute to any manufacturer covered by patient's insurance. [name], Kirsten, MD Taking Active   metFORMIN (GLUCOPHAGE) 500 MG tablet [id number] Yes Take 1 tablet (500 mg total) by mouth daily with breakfast. Lurline Del, FNP Taking Active   Multiple Vitamin (MULTIVITAMIN) tablet [id number] Yes Take 1 tablet by mouth daily. [provider] Taking Active   rosuvastatin (CRESTOR) 5 MG tablet [id number] Yes Take 1 tablet (5 mg total) by mouth daily. Lurline Del, FNP Taking Active   triamcinolone acetonide (KENALOG-40) injection 80 mg [id number]   Blane Ohara, MD  Active   valsartan-hydrochlorothiazide (DIOVAN-HCT) 320-25 MG tablet [id number] Yes Take 1 tablet by mouth daily. Lurline Del, FNP Taking Active             Patient Active Problem List   Diagnosis Date Noted   Type 2 diabetes mellitus without complication, without long-term current use of insulin (HCC) 12/05/2022   Morbid obesity with body mass index (BMI) of 50.0 to 59.9 in adult (HCC) 11/26/2022   Primary osteoarthritis of left knee 11/18/2022   Chronic diastolic heart failure (HCC) 11/18/2022   Dyslipidemia associated with type 2 diabetes mellitus (HCC) 09/09/2021   Essential hypertension 08/15/2020   Fibroids    Urge incontinence 07/30/2020   OSA (obstructive sleep apnea) 07/30/2020    Conditions to be addressed/monitored per PCP order:  HTN and DMII  Care Plan : RN Care Manager Plan of Care  Updates made by Heidi Dach, RN since 04/28/2023 12:00 AM     Problem: Health Management needs related to CHF and DMII      Long-Range Goal: Development of Plan of Care to address Health Management needs related to CHF and DMII   Start Date: 11/26/2022  Expected End Date: 03/26/2023  Note:   Current Barriers:  Chronic Disease Management  support and education needs related to CHF and DMII  Patient with recent PCP visit on 04/23/23. Will have repeat labs on 05/08/23. Patient feels that she has what she needs to manage her care at this time and denies any needs.     RNCM Clinical Goal(s):  Patient will verbalize understanding of plan for management of CHF and DMII as evidenced by patient reports take all medications exactly as prescribed and will call provider for medication related questions as evidenced by patient reports and EMR documentation    attend all scheduled medical appointments: 04/30/23 for Colonoscopy, 05/08/23 for labs, 07/01/23 for Footcare and 08/13/23 with PCP as evidenced by provider documentation        continue to work with RN Care Manager and/or Social Worker to address care management and care coordination needs related to CHF and DMII as evidenced by adherence to CM Team Scheduled appointments       Interventions: Inter-disciplinary care team collaboration (see longitudinal plan of care) Evaluation of current treatment plan related to  self management and patient's adherence to plan as established by provider Reviewed provider notes and discussed with patient Discussed Case Closure, patient aware to reach out to [facility] with any new needs to managing her health    Hypertension Interventions:  (Status:  Goal Met.) Long Term Goal Last practice recorded BP readings: recent home  reading 130/86 BP Readings from Last 3 Encounters:  04/23/23 130/78  12/19/22 130/80  12/05/22 (!) 140/80  Most recent eGFR/CrCl:  Lab Results  Component Value Date   EGFR 47 (L) 04/23/2023    No components found for: "CRCL"  Reviewed medications with patient and discussed importance of compliance Advised patient, providing education and rationale, to monitor blood pressure daily and record, calling PCP for findings outside established parameters Reviewed scheduled/upcoming provider appointments including:  Advised patient to discuss  concerns with provider Assessed social determinant of health barriers  Heart Failure Interventions:  (Status: Goal Met.)  Long Term Goal -Patient down #2, now #282 Wt Readings from Last 3 Encounters:  04/23/23 282 lb 3.2 oz (128 kg)  12/19/22 287 lb (130.2 kg)  12/05/22 288 lb (130.6 kg)   Provided education on low sodium diet Discussed the importance of keeping all appointments with provider Provided patient with education about the role of exercise in the management of heart failure Assessed social determinant of health barriers Advised patient to schedule follow up with Cardiology in June, or sooner if episodes of dizziness continue Advised patient to discuss any concerns or questions with Provider  Diabetes:  (Status: Goal Met.) Long Term Goal   Lab Results  Component Value Date   HGBA1C 6.1 (H) 04/23/2023   @ Assessed patient's understanding of A1c goal: <7% Reviewed medications with patient and discussed importance of medication adherence;        Counseled on importance of regular laboratory monitoring as prescribed;        Provided patient with written educational materials related to hypo and hyperglycemia and importance of correct treatment;       Reviewed scheduled/upcoming provider appointments including: 04/23/23 with PCP;         Review of patient status, including review of consultants reports, relevant laboratory and other test results, and medications completed;        Patient Goals/Self-Care Activities: Take medications as prescribed   Attend all scheduled provider appointments Call provider office for new concerns or questions  Work with the social worker to address care coordination needs and will continue to work with the clinical team to address health care and disease management related needs use salt in moderation watch for swelling in feet, ankles and legs every day eat more whole grains, fruits and vegetables, lean meats and healthy fats dress right for  the weather, hot or cold keep appointment with eye doctor check feet daily for cuts, sores or redness drink 6 to 8 glasses of water each day fill half of plate with vegetables switch to sugar-free drinks       Follow Up:  Patient agrees to Care Plan and Follow-up.  Plan: The  Patient has been provided with contact information for the Managed Medicaid care management team and has been advised to call with any health related questions or concerns.  Date/time of next scheduled RN care management/care coordination outreach:  no follow up  Estanislado Emms RN, BSN [place]  Managed [facility] RN Care Coordinator [phone]

## 2023-04-30 DIAGNOSIS — R42 Dizziness and giddiness: Secondary | ICD-10-CM | POA: Diagnosis not present

## 2023-04-30 DIAGNOSIS — Z1211 Encounter for screening for malignant neoplasm of colon: Secondary | ICD-10-CM | POA: Diagnosis not present

## 2023-04-30 DIAGNOSIS — Z7982 Long term (current) use of aspirin: Secondary | ICD-10-CM | POA: Diagnosis not present

## 2023-04-30 DIAGNOSIS — E119 Type 2 diabetes mellitus without complications: Secondary | ICD-10-CM | POA: Diagnosis not present

## 2023-04-30 DIAGNOSIS — I10 Essential (primary) hypertension: Secondary | ICD-10-CM | POA: Diagnosis not present

## 2023-04-30 DIAGNOSIS — R0602 Shortness of breath: Secondary | ICD-10-CM | POA: Diagnosis not present

## 2023-04-30 DIAGNOSIS — Z7984 Long term (current) use of oral hypoglycemic drugs: Secondary | ICD-10-CM | POA: Diagnosis not present

## 2023-04-30 DIAGNOSIS — K5730 Diverticulosis of large intestine without perforation or abscess without bleeding: Secondary | ICD-10-CM | POA: Diagnosis not present

## 2023-04-30 DIAGNOSIS — K644 Residual hemorrhoidal skin tags: Secondary | ICD-10-CM | POA: Diagnosis not present

## 2023-05-06 ENCOUNTER — Encounter: Payer: Self-pay | Admitting: Family Medicine

## 2023-05-07 DIAGNOSIS — G4733 Obstructive sleep apnea (adult) (pediatric): Secondary | ICD-10-CM | POA: Diagnosis not present

## 2023-05-08 ENCOUNTER — Other Ambulatory Visit: Payer: Medicaid Other

## 2023-05-08 DIAGNOSIS — F419 Anxiety disorder, unspecified: Secondary | ICD-10-CM | POA: Diagnosis not present

## 2023-05-08 DIAGNOSIS — R944 Abnormal results of kidney function studies: Secondary | ICD-10-CM | POA: Diagnosis not present

## 2023-05-09 LAB — COMPREHENSIVE METABOLIC PANEL
ALT: 14 IU/L (ref 0–32)
AST: 16 IU/L (ref 0–40)
Albumin/Globulin Ratio: 1.6 (ref 1.2–2.2)
Albumin: 4.3 g/dL (ref 3.9–4.9)
Alkaline Phosphatase: 57 IU/L (ref 44–121)
BUN/Creatinine Ratio: 16 (ref 12–28)
BUN: 18 mg/dL (ref 8–27)
Bilirubin Total: 0.3 mg/dL (ref 0.0–1.2)
CO2: 22 mmol/L (ref 20–29)
Calcium: 9.7 mg/dL (ref 8.7–10.3)
Chloride: 103 mmol/L (ref 96–106)
Creatinine, Ser: 1.11 mg/dL — ABNORMAL HIGH (ref 0.57–1.00)
Globulin, Total: 2.7 g/dL (ref 1.5–4.5)
Glucose: 84 mg/dL (ref 70–99)
Potassium: 3.9 mmol/L (ref 3.5–5.2)
Sodium: 141 mmol/L (ref 134–144)
Total Protein: 7 g/dL (ref 6.0–8.5)
eGFR: 56 mL/min/{1.73_m2} — ABNORMAL LOW (ref >59)

## 2023-05-12 ENCOUNTER — Other Ambulatory Visit: Payer: Self-pay

## 2023-05-12 MED ORDER — VALSARTAN 320 MG PO TABS: 320.0000 mg | ORAL_TABLET | Freq: Every day | ORAL | 1 refills | Status: DC

## 2023-05-14 DIAGNOSIS — G5602 Carpal tunnel syndrome, left upper limb: Secondary | ICD-10-CM | POA: Diagnosis not present

## 2023-05-15 DIAGNOSIS — F419 Anxiety disorder, unspecified: Secondary | ICD-10-CM | POA: Diagnosis not present

## 2023-05-16 ENCOUNTER — Other Ambulatory Visit: Payer: Medicaid Other

## 2023-05-22 DIAGNOSIS — F419 Anxiety disorder, unspecified: Secondary | ICD-10-CM | POA: Diagnosis not present

## 2023-05-26 ENCOUNTER — Other Ambulatory Visit: Payer: Self-pay | Admitting: Family Medicine

## 2023-05-26 DIAGNOSIS — E119 Type 2 diabetes mellitus without complications: Secondary | ICD-10-CM

## 2023-05-30 DIAGNOSIS — S4492XA Injury of unspecified nerve at shoulder and upper arm level, left arm, initial encounter: Secondary | ICD-10-CM | POA: Diagnosis not present

## 2023-06-05 DIAGNOSIS — F419 Anxiety disorder, unspecified: Secondary | ICD-10-CM | POA: Diagnosis not present

## 2023-06-09 DIAGNOSIS — F419 Anxiety disorder, unspecified: Secondary | ICD-10-CM | POA: Diagnosis not present

## 2023-06-19 DIAGNOSIS — F419 Anxiety disorder, unspecified: Secondary | ICD-10-CM | POA: Diagnosis not present

## 2023-06-23 DIAGNOSIS — F419 Anxiety disorder, unspecified: Secondary | ICD-10-CM | POA: Diagnosis not present

## 2023-07-01 ENCOUNTER — Ambulatory Visit: Payer: Medicaid Other | Admitting: Podiatry

## 2023-07-31 DIAGNOSIS — F419 Anxiety disorder, unspecified: Secondary | ICD-10-CM | POA: Diagnosis not present

## 2023-08-01 DIAGNOSIS — F419 Anxiety disorder, unspecified: Secondary | ICD-10-CM | POA: Diagnosis not present

## 2023-08-04 ENCOUNTER — Ambulatory Visit (INDEPENDENT_AMBULATORY_CARE_PROVIDER_SITE_OTHER): Payer: Medicaid Other | Admitting: Podiatry

## 2023-08-04 DIAGNOSIS — E1142 Type 2 diabetes mellitus with diabetic polyneuropathy: Secondary | ICD-10-CM

## 2023-08-04 DIAGNOSIS — B351 Tinea unguium: Secondary | ICD-10-CM | POA: Diagnosis not present

## 2023-08-04 DIAGNOSIS — M79674 Pain in right toe(s): Secondary | ICD-10-CM | POA: Diagnosis not present

## 2023-08-04 DIAGNOSIS — L84 Corns and callosities: Secondary | ICD-10-CM | POA: Diagnosis not present

## 2023-08-04 DIAGNOSIS — M79675 Pain in left toe(s): Secondary | ICD-10-CM

## 2023-08-04 NOTE — Progress Notes (Signed)
  Subjective:  Patient ID: Virginia Barron, female    DOB: 15-Jan-1961,  MRN: [id number]  Chief Complaint  Patient presents with   Nail Problem    Diabetic Foot Care-nail trim    Callouses    Callus trim right foot     62 y.o. female presents with concern for dry skin discoloration of the nails and pain due to the length the nails.  She does have a history of type 2 diabetes with some neuropathy.  She takes metformin does not take insulin.  She has difficulty trimming the nails.  Callus present on the right foot.  Past Medical History:  Diagnosis Date   Chronic diastolic heart failure (HCC) 11/18/2022   Dyslipidemia associated with type 2 diabetes mellitus (HCC) 09/09/2021   Essential hypertension 08/15/2020   Fibroids    Morbid obesity with body mass index (BMI) of 45.0 to 49.9 in adult [facility]) 07/30/2020   Morbid obesity with body mass index (BMI) of 50.0 to 59.9 in adult (HCC) 07/30/2020   OSA (obstructive sleep apnea) 07/30/2020   Primary osteoarthritis of left knee 11/18/2022   Urge incontinence 07/30/2020    Allergies  Allergen Reactions   Avocado    Iodine Hives, Photosensitivity and Swelling   Kiwi Extract    Penicillins    Pork-Derived Products    Shellfish Allergy    Latex Hives, Itching, Rash and Swelling    ROS: Negative except as per HPI above  Objective:  General: AAO x3, NAD  Dermatological: Onychomycosis of all nails with thickening dystrophy discoloration and pain on palpation x 5 bilateral foot.  Also with preulcerative hyperkeratotic lesion at the lateral aspect of the right fifth digit.  Xerosis of the skin with red rash present in moccasin distribution bilateral plantar foot  Vascular:  Dorsalis Pedis artery and Posterior Tibial artery pedal pulses are 2/4 bilateral.  Capillary fill time < 3 sec to all digits.   Neruologic: Grossly diminished via light touch bilaterally  Musculoskeletal: No gross boney pedal deformities bilateral. No pain, crepitus, or  limitation noted with foot and ankle range of motion bilateral. Muscular strength 5/5 in all groups tested bilateral.  Gait: Unassisted, Nonantalgic.   No images are attached to the encounter.   Assessment:   1. Pain due to onychomycosis of toenails of both feet   2. Pre-ulcerative calluses   3. DM type 2 with diabetic peripheral neuropathy (HCC)       Plan:  Patient was evaluated and treated and all questions answered.  #Hyperkeratotic lesions/pre ulcerative calluses present lateral aspect of the right fifth toe All symptomatic hyperkeratoses x 1 separate lesions were safely debrided with a sterile #10 blade to patient's level of comfort without incident. We discussed preventative and palliative care of these lesions including supportive and accommodative shoegear, padding, prefabricated and custom molded accommodative orthoses, use of a pumice stone and lotions/creams daily.  #Onychomycosis with pain  -Nails palliatively debrided as below. -Educated on self-care  Procedure: Nail Debridement Rationale: Pain Type of Debridement: manual, sharp debridement. Instrumentation: Nail nipper, rotary burr. Number of Nails: 10  Return in about 3 months (around 11/04/2023) for [facility].          Corinna Gab, DPM [facility] / [facility]

## 2023-08-12 NOTE — Assessment & Plan Note (Deleted)
Continue oxybutynin 5 mg once daily.

## 2023-08-12 NOTE — Assessment & Plan Note (Signed)
Well controlled.  No changes to medicines. Valsartan-Hctz 320 mg daily, amlodipine 10 mg daily. Continue to work on eating a healthy diet and exercise.  Labs drawn today.

## 2023-08-12 NOTE — Assessment & Plan Note (Addendum)
Control: diabetes good. Hyperlipidemia good.  Recommend check sugars fasting daily. Recommend check feet daily. Recommend annual eye exams. Medicines: Metformin 500 mg daily. Continue crestor 5 mg before bed and asa 325 mg daily.  Continue to work on eating a healthy diet and exercise.  Labs drawn today.

## 2023-08-12 NOTE — Progress Notes (Signed)
Subjective:  Patient ID: Virginia Barron, female    DOB: 07-Aug-1961  Age: 62 y.o. MRN: [id number]  Chief Complaint  Patient presents with   Medical Management of Chronic Issues    HPI   62 year old African-American female who presents for chronic follow-up.   Diabetes:  Complications:hypertension, obesity.  Glucose checking: sporadic when feels poorly. Glucose logs:105-117. Most recent A1C: 6.1 Current medications: metformin 500 mg once daily.  Last Eye Exam: 12/2022 Foot checks: daily  Hyperlipidemia: Current medications: crestor 5 mg once daily at night and aspirin 325 mg daily.   Hypertension: Current medications: amlodpine 10 mg 1/2 daily and valsartan 320 mg once daily.   Diet: tries to eat healthy. Exercise: unable to walk due to shortness of breath and exhaustion after minimal exertion.   GAD: on Lexapro 20 mg daily.  Psychiatrist in [place].      08/13/2023    9:11 AM 04/23/2023    7:44 AM 11/21/2022   10:07 AM 11/18/2022    3:42 PM 04/08/2021    7:58 PM  Depression screen PHQ 2/9  Decreased Interest 0 1 1 1  0  Down, Depressed, Hopeless 0 1 1 0 0  PHQ - 2 Score 0 2 2 1  0  Altered sleeping 0 0 2 2   Tired, decreased energy 0 3 1 1    Change in appetite 0 1 0 0   Feeling bad or failure about yourself  0 0 0 0   Trouble concentrating 0 2 1 1    Moving slowly or fidgety/restless 0 0 0 0   Suicidal thoughts 0 0 0 0   PHQ-9 Score 0 8 6 5    Difficult doing work/chores Not difficult at all Not difficult at all Somewhat difficult Somewhat difficult         08/13/2023    9:11 AM  Fall Risk   Falls in the past year? 1  Number falls in past yr: 0  Injury with Fall? 0  Risk for fall due to : Impaired mobility;Impaired balance/gait  Follow up Falls evaluation completed    Patient Care Team: Blane Ohara, MD as PCP - General (Family Medicine) Thomasene Ripple, DO as PCP - Cardiology (Cardiology) Alden Hipp, RPH-CPP (Pharmacist)   Review of Systems   Constitutional:  Negative for chills, fatigue and fever.  HENT:  Negative for congestion, ear pain, rhinorrhea and sore throat.   Respiratory:  Negative for cough and shortness of breath.   Cardiovascular:  Negative for chest pain.  Gastrointestinal:  Negative for abdominal pain, constipation, diarrhea, nausea and vomiting.  Genitourinary:  Negative for dysuria and urgency.  Musculoskeletal:  Negative for back pain and myalgias.  Neurological:  Negative for dizziness, weakness, light-headedness and headaches.  Psychiatric/Behavioral:  Negative for dysphoric mood. The patient is not nervous/anxious.     Current Outpatient Medications on File Prior to Visit  Medication Sig Dispense Refill   escitalopram (LEXAPRO) 20 MG tablet Take 20 mg by mouth daily.     acetaminophen (TYLENOL) 500 MG tablet Take 500 mg by mouth every 6 (six) hours as needed.     amLODipine (NORVASC) 10 MG tablet Take 0.5 tablets (5 mg total) by mouth daily. 90 tablet 3   aspirin EC 325 MG tablet Take 325 mg by mouth daily.     Blood Glucose Monitoring Suppl DEVI Use to check glucose up to twice daily. May substitute to any manufacturer covered by patient's insurance. 1 each 0   cholecalciferol (VITAMIN D3) 25 MCG (1000  UNIT) tablet Take 400 Units by mouth daily.      Glucose Blood (BLOOD GLUCOSE TEST STRIPS) STRP Use to check glucose up to twice daily. May substitute to any manufacturer covered by patient's insurance. 100 strip 3   Lancet Device MISC Use to check glucose up to twice daily. May substitute to any manufacturer covered by patient's insurance. 1 each 2   Lancets Misc. MISC Use to check glucose up to twice daily. May substitute to any manufacturer covered by patient's insurance. 100 each 2   metFORMIN (GLUCOPHAGE) 500 MG tablet TAKE 1 TABLET BY MOUTH EVERY DAY WITH BREAKFAST 90 tablet 0   Multiple Vitamin (MULTIVITAMIN) tablet Take 1 tablet by mouth daily.     rosuvastatin (CRESTOR) 5 MG tablet Take 1 tablet (5  mg total) by mouth daily. 90 tablet 3   valsartan (DIOVAN) 320 MG tablet Take 1 tablet (320 mg total) by mouth daily. 90 tablet 1   Current Facility-Administered Medications on File Prior to Visit  Medication Dose Route Frequency Provider Last Rate Last Admin   triamcinolone acetonide (KENALOG-40) injection 80 mg  80 mg Intra-articular Once Blane Ohara, MD       Past Medical History:  Diagnosis Date   Chronic diastolic heart failure (HCC) 11/18/2022   Dyslipidemia associated with type 2 diabetes mellitus (HCC) 09/09/2021   Essential hypertension 08/15/2020   Fibroids    Morbid obesity with body mass index (BMI) of 45.0 to 49.9 in adult [facility]) 07/30/2020   Morbid obesity with body mass index (BMI) of 50.0 to 59.9 in adult (HCC) 07/30/2020   OSA (obstructive sleep apnea) 07/30/2020   Primary osteoarthritis of left knee 11/18/2022   Urge incontinence 07/30/2020   Past Surgical History:  Procedure Laterality Date   ABDOMINAL HYSTERECTOMY     precancerous cervical cells and fibroids.   conescopy     abnormal pap   LASIK     TUBAL LIGATION      Family History  Problem Relation Age of Onset   Diabetes Mother    Diabetes Father    Heart attack Father    Diabetes Sister    Breast cancer Neg Hx    Social History   Socioeconomic History   Marital status: Divorced    Spouse name: Not on file   Number of children: Not on file   Years of education: Not on file   Highest education level: Some college, no degree  Occupational History   Not on file  Tobacco Use   Smoking status: Former    Current packs/day: 0.00    Types: Cigarettes    Quit date: 1980    Years since quitting: 44.7   Smokeless tobacco: Never  Substance and Sexual Activity   Alcohol use: Yes    Alcohol/week: 1.0 standard drink of alcohol    Types: 1 Standard drinks or equivalent per week    Comment: per month   Drug use: Never   Sexual activity: Not on file  Other Topics Concern   Not on file  Social  History Narrative   Not on file   Social Determinants of Health   Financial Resource Strain: Medium Risk (04/19/2023)   Overall Financial Resource Strain (CARDIA)    Difficulty of Paying Living Expenses: Somewhat hard  Food Insecurity: Food Insecurity Present (04/19/2023)   Hunger Vital Sign    Worried About Running Out of Food in the Last Year: Sometimes true    Ran Out of Food in the Last Year:  Sometimes true  Transportation Needs: Unmet Transportation Needs (04/19/2023)   PRAPARE - Transportation    Lack of Transportation (Medical): Yes    Lack of Transportation (Non-Medical): Yes  Physical Activity: Insufficiently Active (04/19/2023)   Exercise Vital Sign    Days of Exercise per Week: 1 day    Minutes of Exercise per Session: 30 min  Stress: No Stress Concern Present (04/19/2023)   [employer] of Occupational Health - Occupational Stress Questionnaire    Feeling of Stress : Only a little  Recent Concern: Stress - Stress Concern Present (01/24/2023)   [employer] of Occupational Health - Occupational Stress Questionnaire    Feeling of Stress : To some extent  Social Connections: Moderately Isolated (04/19/2023)   Social Connection and Isolation Panel [NHANES]    Frequency of Communication with Friends and Family: Once a week    Frequency of Social Gatherings with Friends and Family: Once a week    Attends Religious Services: 1 to 4 times per year    Active Member of [employer] or Organizations: Yes    Attends Banker Meetings: 1 to 4 times per year    Marital Status: Divorced    Objective:  BP 136/82   Pulse 72   Temp 97.6 F (36.4 C)   Ht 5\' 5"  (1.651 m)   Wt 287 lb (130.2 kg)   SpO2 98%   BMI 47.76 kg/m      08/13/2023    9:10 AM 04/23/2023    7:44 AM 12/19/2022    7:34 AM  BP/Weight  Systolic BP 136 130 130  Diastolic BP 82 78 80  Wt. (Lbs) 287 282.2 287  BMI 47.76 kg/m2 46.96 kg/m2 47.76 kg/m2    Physical Exam Vitals reviewed.   Constitutional:      Appearance: Normal appearance. She is obese.  Neck:     Vascular: No carotid bruit.  Cardiovascular:     Rate and Rhythm: Normal rate and regular rhythm.     Heart sounds: Normal heart sounds.  Pulmonary:     Effort: Pulmonary effort is normal. No respiratory distress.     Breath sounds: Normal breath sounds.  Abdominal:     General: Abdomen is flat. Bowel sounds are normal.     Palpations: Abdomen is soft.     Tenderness: There is no abdominal tenderness.  Neurological:     Mental Status: She is alert and oriented to person, place, and time.  Psychiatric:        Mood and Affect: Mood normal.        Behavior: Behavior normal.     Diabetic Foot Exam - Simple   Simple Foot Form Diabetic Foot exam was performed with the following findings: Yes 08/13/2023  9:28 AM  Visual Inspection No deformities, no ulcerations, no other skin breakdown bilaterally: Yes See comments: Yes Sensation Testing Intact to touch and monofilament testing bilaterally: Yes Pulse Check Posterior Tibialis and Dorsalis pulse intact bilaterally: Yes Comments Dry skin BL feet.      Lab Results  Component Value Date   WBC 10.1 08/13/2023   HGB 12.1 08/13/2023   HCT 39.6 08/13/2023   PLT 371 08/13/2023   GLUCOSE 99 08/13/2023   CHOL 141 08/13/2023   TRIG 72 08/13/2023   HDL 37 (L) 08/13/2023   LDLCALC 90 08/13/2023   ALT 16 08/13/2023   AST 15 08/13/2023   NA 141 08/13/2023   K 4.7 08/13/2023   CL 106 08/13/2023   CREATININE 1.25 (  H) 08/13/2023   BUN 15 08/13/2023   CO2 20 08/13/2023   TSH 1.590 04/23/2023   HGBA1C 6.2 (H) 08/13/2023   MICROALBUR 10 09/03/2021      Assessment & Plan:    Essential hypertension Assessment & Plan: Well controlled.  No changes to medicines. Valsartan-Hctz 320 mg daily, amlodipine 10 mg daily. Continue to work on eating a healthy diet and exercise.  Labs drawn today.    Orders: -     CBC with Differential/Platelet -      Comprehensive metabolic panel  Dyslipidemia associated with type 2 diabetes mellitus (HCC) Assessment & Plan: Control: diabetes good. Hyperlipidemia good.  Recommend check sugars fasting daily. Recommend check feet daily. Recommend annual eye exams. Medicines: Metformin 500 mg daily. Continue crestor 5 mg before bed and asa 325 mg daily.  Continue to work on eating a healthy diet and exercise.  Labs drawn today.     Orders: -     Hemoglobin A1c -     Lipid panel  Urge incontinence Assessment & Plan: Well-controlled.  Continue oxybutynin 5 mg once daily.   Immunization due -     Flu Vaccine MDCK QUAD PF  Screening for HIV without presence of risk factors -     HIV Antibody (routine testing w rflx)  Encounter for hepatitis C screening test for low risk patient -     HCV Ab w Reflex to Quant PCR  Encounter for immunization -     Influenza, MDCK, trivalent, PF(Flucelvax egg-free)  Morbid obesity with body mass index (BMI) of 45.0 to 49.9 in adult [facility])  Other orders -     [employer] CKD Program -     Interpretation:     No orders of the defined types were placed in this encounter.   Orders Placed This Encounter  Procedures   Flu Vaccine MDCK QUAD PF   Influenza, MDCK, trivalent, PF(Flucelvax egg-free)   CBC with Differential/Platelet   Comprehensive metabolic panel   Hemoglobin A1c   Lipid panel   HIV Antibody (routine testing w rflx)   HCV Ab w Reflex to Quant PCR   [employer] CKD Program   Interpretation:     Follow-up: No follow-ups on file.   I,Marla I Leal-Borjas,acting as a scribe for Blane Ohara, MD.,have documented all relevant documentation on the behalf of Blane Ohara, MD,as directed by  Blane Ohara, MD while in the presence of Blane Ohara, MD.   An After Visit Summary was printed and given to the patient.  Blane Ohara, MD [facility] [phone]

## 2023-08-13 ENCOUNTER — Encounter: Payer: Self-pay | Admitting: Family Medicine

## 2023-08-13 ENCOUNTER — Other Ambulatory Visit: Payer: Self-pay | Admitting: Podiatry

## 2023-08-13 ENCOUNTER — Ambulatory Visit (INDEPENDENT_AMBULATORY_CARE_PROVIDER_SITE_OTHER): Payer: Medicaid Other | Admitting: Family Medicine

## 2023-08-13 VITALS — BP 136/82 | HR 72 | Temp 97.6°F | Ht 65.0 in | Wt 287.0 lb

## 2023-08-13 DIAGNOSIS — Z23 Encounter for immunization: Secondary | ICD-10-CM

## 2023-08-13 DIAGNOSIS — Z1159 Encounter for screening for other viral diseases: Secondary | ICD-10-CM

## 2023-08-13 DIAGNOSIS — Z114 Encounter for screening for human immunodeficiency virus [HIV]: Secondary | ICD-10-CM | POA: Diagnosis not present

## 2023-08-13 DIAGNOSIS — I10 Essential (primary) hypertension: Secondary | ICD-10-CM

## 2023-08-13 DIAGNOSIS — E785 Hyperlipidemia, unspecified: Secondary | ICD-10-CM | POA: Diagnosis not present

## 2023-08-13 DIAGNOSIS — N3941 Urge incontinence: Secondary | ICD-10-CM

## 2023-08-13 DIAGNOSIS — Z6841 Body Mass Index (BMI) 40.0 and over, adult: Secondary | ICD-10-CM

## 2023-08-13 DIAGNOSIS — E1169 Type 2 diabetes mellitus with other specified complication: Secondary | ICD-10-CM

## 2023-08-13 NOTE — Patient Instructions (Addendum)
Apply ketoconazole cream and coconut oil daily. [facility] at [facility]

## 2023-08-14 LAB — CBC WITH DIFFERENTIAL/PLATELET
Basophils Absolute: 0.1 10*3/uL (ref 0.0–0.2)
Basos: 1 %
EOS (ABSOLUTE): 0.2 10*3/uL (ref 0.0–0.4)
Eos: 2 %
Hematocrit: 39.6 % (ref 34.0–46.6)
Hemoglobin: 12.1 g/dL (ref 11.1–15.9)
Immature Grans (Abs): 0.1 10*3/uL (ref 0.0–0.1)
Immature Granulocytes: 1 %
Lymphocytes Absolute: 2.3 10*3/uL (ref 0.7–3.1)
Lymphs: 22 %
MCH: 23.2 pg — ABNORMAL LOW (ref 26.6–33.0)
MCHC: 30.6 g/dL — ABNORMAL LOW (ref 31.5–35.7)
MCV: 76 fL — ABNORMAL LOW (ref 79–97)
Monocytes Absolute: 0.8 10*3/uL (ref 0.1–0.9)
Monocytes: 8 %
Neutrophils Absolute: 6.7 10*3/uL (ref 1.4–7.0)
Neutrophils: 66 %
Platelets: 371 10*3/uL (ref 150–450)
RBC: 5.21 x10E6/uL (ref 3.77–5.28)
RDW: 14.2 % (ref 11.7–15.4)
WBC: 10.1 10*3/uL (ref 3.4–10.8)

## 2023-08-14 LAB — LIPID PANEL
Chol/HDL Ratio: 3.8 ratio (ref 0.0–4.4)
Cholesterol, Total: 141 mg/dL (ref 100–199)
HDL: 37 mg/dL — ABNORMAL LOW (ref >39)
LDL Chol Calc (NIH): 90 mg/dL (ref 0–99)
Triglycerides: 72 mg/dL (ref 0–149)
VLDL Cholesterol Cal: 14 mg/dL (ref 5–40)

## 2023-08-14 LAB — COMPREHENSIVE METABOLIC PANEL
ALT: 16 IU/L (ref 0–32)
AST: 15 IU/L (ref 0–40)
Albumin: 4 g/dL (ref 3.9–4.9)
Alkaline Phosphatase: 61 IU/L (ref 44–121)
BUN/Creatinine Ratio: 12 (ref 12–28)
BUN: 15 mg/dL (ref 8–27)
Bilirubin Total: 0.2 mg/dL (ref 0.0–1.2)
CO2: 20 mmol/L (ref 20–29)
Calcium: 9.5 mg/dL (ref 8.7–10.3)
Chloride: 106 mmol/L (ref 96–106)
Creatinine, Ser: 1.25 mg/dL — ABNORMAL HIGH (ref 0.57–1.00)
Globulin, Total: 3.2 g/dL (ref 1.5–4.5)
Glucose: 99 mg/dL (ref 70–99)
Potassium: 4.7 mmol/L (ref 3.5–5.2)
Sodium: 141 mmol/L (ref 134–144)
Total Protein: 7.2 g/dL (ref 6.0–8.5)
eGFR: 48 mL/min/{1.73_m2} — ABNORMAL LOW (ref >59)

## 2023-08-14 LAB — HIV ANTIBODY (ROUTINE TESTING W REFLEX): HIV Screen 4th Generation wRfx: NONREACTIVE

## 2023-08-14 LAB — HCV AB W REFLEX TO QUANT PCR: HCV Ab: NONREACTIVE

## 2023-08-14 LAB — HEMOGLOBIN A1C
Est. average glucose Bld gHb Est-mCnc: 131 mg/dL
Hgb A1c MFr Bld: 6.2 % — ABNORMAL HIGH (ref 4.8–5.6)

## 2023-08-16 NOTE — Assessment & Plan Note (Signed)
Well-controlled.  Continue oxybutynin 5 mg once daily. 

## 2023-08-17 DIAGNOSIS — Z114 Encounter for screening for human immunodeficiency virus [HIV]: Secondary | ICD-10-CM | POA: Insufficient documentation

## 2023-08-17 DIAGNOSIS — Z23 Encounter for immunization: Secondary | ICD-10-CM | POA: Insufficient documentation

## 2023-08-17 DIAGNOSIS — Z1159 Encounter for screening for other viral diseases: Secondary | ICD-10-CM

## 2023-08-17 HISTORY — DX: Encounter for screening for human immunodeficiency virus (HIV): Z11.4

## 2023-08-17 HISTORY — DX: Encounter for immunization: Z23

## 2023-08-17 HISTORY — DX: Encounter for screening for other viral diseases: Z11.59

## 2023-08-17 NOTE — Assessment & Plan Note (Signed)
Recommend continue to work on eating healthy diet and exercise.  

## 2023-08-27 DIAGNOSIS — F419 Anxiety disorder, unspecified: Secondary | ICD-10-CM | POA: Diagnosis not present

## 2023-09-04 DIAGNOSIS — F419 Anxiety disorder, unspecified: Secondary | ICD-10-CM | POA: Diagnosis not present

## 2023-09-18 DIAGNOSIS — F419 Anxiety disorder, unspecified: Secondary | ICD-10-CM | POA: Diagnosis not present

## 2023-10-02 DIAGNOSIS — F419 Anxiety disorder, unspecified: Secondary | ICD-10-CM | POA: Diagnosis not present

## 2023-10-09 ENCOUNTER — Other Ambulatory Visit: Payer: Self-pay | Admitting: Family Medicine

## 2023-10-09 DIAGNOSIS — E119 Type 2 diabetes mellitus without complications: Secondary | ICD-10-CM

## 2023-10-16 DIAGNOSIS — F419 Anxiety disorder, unspecified: Secondary | ICD-10-CM | POA: Diagnosis not present

## 2023-10-30 DIAGNOSIS — F419 Anxiety disorder, unspecified: Secondary | ICD-10-CM | POA: Diagnosis not present

## 2023-11-13 DIAGNOSIS — F419 Anxiety disorder, unspecified: Secondary | ICD-10-CM | POA: Diagnosis not present

## 2023-11-24 NOTE — Progress Notes (Signed)
Subjective:  Patient ID: Virginia Barron, female    DOB: 1961-02-01  Age: 62 y.o. MRN: [id number]  Chief Complaint  Patient presents with   Medical Management of Chronic Issues    **Patient  requesting letter that states due to her and her sons medical issues they need a ground level apartment** son has MS. Myles Rosenthal.  HPI   8 year old African-American female who presents for chronic follow-up.   Diabetes:  Complications:hypertension, obesity.  Glucose checking fasting: checks sporadically when feels poorly. Glucose logs:93-124 Most recent A1C: 6.2 Current medications: metformin 500 mg once daily.  Last Eye Exam: 12/2022 Foot checks: daily  Has been feeling lightheaded right before and/or after she eats. BS have been 88-124 during that time.  Hyperlipidemia: Current medications: crestor 5 mg once daily at night and aspirin 325 mg daily.   Hypertension: Current medications: amlodpine 10 mg 1/2 daily and valsartan 320 mg once daily.   Diet: tries to eat healthy. Exercise: unable to walk due to shortness of breath and exhaustion after minimal exertion.   GAD: on Lexapro 20 mg daily.  Psychiatrist in [place].      11/25/2023    9:12 AM 08/13/2023    9:11 AM 04/23/2023    7:44 AM 11/21/2022   10:07 AM 11/18/2022    3:42 PM  Depression screen PHQ 2/9  Decreased Interest 2 0 1 1 1   Down, Depressed, Hopeless 2 0 1 1 0  PHQ - 2 Score 4 0 2 2 1   Altered sleeping 1 0 0 2 2  Tired, decreased energy 3 0 3 1 1   Change in appetite 0 0 1 0 0  Feeling bad or failure about yourself  0 0 0 0 0  Trouble concentrating 0 0 2 1 1   Moving slowly or fidgety/restless 0 0 0 0 0  Suicidal thoughts 2 0 0 0 0  PHQ-9 Score 10 0 8 6 5   Difficult doing work/chores Somewhat difficult Not difficult at all Not difficult at all Somewhat difficult Somewhat difficult        08/13/2023    9:11 AM  Fall Risk   Falls in the past year? 1  Number falls in past yr: 0  Injury with Fall?  0  Risk for fall due to : Impaired mobility;Impaired balance/gait  Follow up Falls evaluation completed    Patient Care Team: Blane Ohara, MD as PCP - General (Family Medicine) Thomasene Ripple, DO as PCP - Cardiology (Cardiology) Gabriel Carina, [facility] (Inactive) (Pharmacist)   Review of Systems  Constitutional:  Negative for chills, fatigue and fever.  HENT:  Negative for congestion, ear pain, rhinorrhea and sore throat.   Respiratory:  Negative for cough and shortness of breath.   Cardiovascular:  Negative for chest pain.  Gastrointestinal:  Negative for abdominal pain, constipation, diarrhea, nausea and vomiting.  Genitourinary:  Negative for dysuria and urgency.  Musculoskeletal:  Negative for back pain and myalgias.  Neurological:  Negative for dizziness, weakness, light-headedness and headaches.  Psychiatric/Behavioral:  Negative for dysphoric mood. The patient is not nervous/anxious.     Current Outpatient Medications on File Prior to Visit  Medication Sig Dispense Refill   acetaminophen (TYLENOL) 500 MG tablet Take 500 mg by mouth every 6 (six) hours as needed.     amLODipine (NORVASC) 10 MG tablet Take 0.5 tablets (5 mg total) by mouth daily. 90 tablet 3   aspirin EC 325 MG tablet Take 325 mg by mouth daily.  Blood Glucose Monitoring Suppl DEVI Use to check glucose up to twice daily. May substitute to any manufacturer covered by patient's insurance. 1 each 0   cholecalciferol (VITAMIN D3) 25 MCG (1000 UNIT) tablet Take 400 Units by mouth daily.      escitalopram (LEXAPRO) 20 MG tablet Take 20 mg by mouth daily.     Glucose Blood (BLOOD GLUCOSE TEST STRIPS) STRP Use to check glucose up to twice daily. May substitute to any manufacturer covered by patient's insurance. 100 strip 3   ketoconazole (NIZORAL) 2 % cream APPLY TOPICALLY TO AFFECTED AREA DAILY. 60 g 0   Lancet Device MISC Use to check glucose up to twice daily. May substitute to any manufacturer covered by patient's  insurance. 1 each 2   Lancets Misc. MISC Use to check glucose up to twice daily. May substitute to any manufacturer covered by patient's insurance. 100 each 2   metFORMIN (GLUCOPHAGE) 500 MG tablet TAKE 1 TABLET BY MOUTH EVERY DAY WITH BREAKFAST 90 tablet 0   Multiple Vitamin (MULTIVITAMIN) tablet Take 1 tablet by mouth daily.     rosuvastatin (CRESTOR) 5 MG tablet Take 1 tablet (5 mg total) by mouth daily. 90 tablet 3   triamcinolone cream (KENALOG) 0.1 % APPLY 1 APPLICATION TOPICALLY 2 TIMES DAILY FOR 15 DAYS. 30 g 0   valsartan (DIOVAN) 320 MG tablet Take 1 tablet (320 mg total) by mouth daily. 90 tablet 1   Current Facility-Administered Medications on File Prior to Visit  Medication Dose Route Frequency Provider Last Rate Last Admin   triamcinolone acetonide (KENALOG-40) injection 80 mg  80 mg Intra-articular Once Blane Ohara, MD       Past Medical History:  Diagnosis Date   Chronic diastolic heart failure (HCC) 11/18/2022   Dyslipidemia associated with type 2 diabetes mellitus (HCC) 09/09/2021   Essential hypertension 08/15/2020   Fibroids    Morbid obesity with body mass index (BMI) of 45.0 to 49.9 in adult [facility]) 07/30/2020   Morbid obesity with body mass index (BMI) of 50.0 to 59.9 in adult (HCC) 07/30/2020   OSA (obstructive sleep apnea) 07/30/2020   Primary osteoarthritis of left knee 11/18/2022   Urge incontinence 07/30/2020   Past Surgical History:  Procedure Laterality Date   ABDOMINAL HYSTERECTOMY     precancerous cervical cells and fibroids.   conescopy     abnormal pap   LASIK     TUBAL LIGATION      Family History  Problem Relation Age of Onset   Diabetes Mother    Diabetes Father    Heart attack Father    Diabetes Sister    Breast cancer Neg Hx    Social History   Socioeconomic History   Marital status: Divorced    Spouse name: Not on file   Number of children: Not on file   Years of education: Not on file   Highest education level: Some college, no  degree  Occupational History   Not on file  Tobacco Use   Smoking status: Former    Current packs/day: 0.00    Types: Cigarettes    Quit date: 1980    Years since quitting: 45.0   Smokeless tobacco: Never  Substance and Sexual Activity   Alcohol use: Yes    Alcohol/week: 1.0 standard drink of alcohol    Types: 1 Standard drinks or equivalent per week    Comment: per month   Drug use: Never   Sexual activity: Not on file  Other Topics  Concern   Not on file  Social History Narrative   Not on file   Social Drivers of Health   Financial Resource Strain: Medium Risk (11/23/2023)   Overall Financial Resource Strain (CARDIA)    Difficulty of Paying Living Expenses: Somewhat hard  Food Insecurity: Food Insecurity Present (11/23/2023)   Hunger Vital Sign    Worried About Running Out of Food in the Last Year: Sometimes true    Ran Out of Food in the Last Year: Sometimes true  Transportation Needs: Unmet Transportation Needs (11/23/2023)   PRAPARE - Administrator, Civil Service (Medical): Yes    Lack of Transportation (Non-Medical): Yes  Physical Activity: Inactive (11/23/2023)   Exercise Vital Sign    Days of Exercise per Week: 0 days    Minutes of Exercise per Session: 30 min  Stress: Stress Concern Present (11/23/2023)   [employer] of Occupational Health - Occupational Stress Questionnaire    Feeling of Stress : Rather much  Social Connections: Moderately Integrated (11/23/2023)   Social Connection and Isolation Panel [NHANES]    Frequency of Communication with Friends and Family: Twice a week    Frequency of Social Gatherings with Friends and Family: Once a week    Attends Religious Services: 1 to 4 times per year    Active Member of [employer] or Organizations: Yes    Attends Banker Meetings: 1 to 4 times per year    Marital Status: Divorced    Objective:  BP 118/82   Pulse 99   Temp 97.6 F (36.4 C)   Ht 5\' 5"  (1.651 m)   Wt 285 lb  (129.3 kg)   SpO2 97%   BMI 47.43 kg/m      11/25/2023    9:09 AM 08/13/2023    9:10 AM 04/23/2023    7:44 AM  BP/Weight  Systolic BP 118 136 130  Diastolic BP 82 82 78  Wt. (Lbs) 285 287 282.2  BMI 47.43 kg/m2 47.76 kg/m2 46.96 kg/m2    Physical Exam Vitals reviewed.  Constitutional:      Appearance: Normal appearance. She is obese.  Neck:     Vascular: No carotid bruit.  Cardiovascular:     Rate and Rhythm: Normal rate and regular rhythm.     Heart sounds: Normal heart sounds.  Pulmonary:     Effort: Pulmonary effort is normal. No respiratory distress.     Breath sounds: Normal breath sounds.  Abdominal:     General: Abdomen is flat. Bowel sounds are normal.     Palpations: Abdomen is soft.     Tenderness: There is no abdominal tenderness.  Neurological:     Mental Status: She is alert and oriented to person, place, and time.  Psychiatric:        Mood and Affect: Mood normal.        Behavior: Behavior normal.     Diabetic Foot Exam - Simple   Simple Foot Form Diabetic Foot exam was performed with the following findings: Yes 11/25/2023  9:51 AM  Visual Inspection See comments: Yes Sensation Testing Intact to touch and monofilament testing bilaterally: Yes Pulse Check Posterior Tibialis and Dorsalis pulse intact bilaterally: Yes Comments Thickened nails      Lab Results  Component Value Date   WBC 8.1 11/25/2023   HGB 11.9 11/25/2023   HCT 39.2 11/25/2023   PLT 303 11/25/2023   GLUCOSE 101 (H) 11/25/2023   CHOL 115 11/25/2023   TRIG 51 11/25/2023  HDL 44 11/25/2023   LDLCALC 59 11/25/2023   ALT 11 11/25/2023   AST 18 11/25/2023   NA 140 11/25/2023   K 4.7 11/25/2023   CL 107 (H) 11/25/2023   CREATININE 1.14 (H) 11/25/2023   BUN 20 11/25/2023   CO2 19 (L) 11/25/2023   TSH 1.590 04/23/2023   HGBA1C 6.3 (H) 11/25/2023   MICROALBUR 10 09/03/2021      Assessment & Plan:    Essential hypertension Assessment & Plan: Well controlled.  No  changes to medicines. Valsartan-Hctz 320 mg daily, amlodipine 10 mg daily. Continue to work on eating a healthy diet and exercise.  Labs drawn today.    Orders: -     CBC with Differential/Platelet -     Comprehensive metabolic panel  Dyslipidemia associated with type 2 diabetes mellitus (HCC) Assessment & Plan: Control: diabetes good. Hyperlipidemia good.  Recommend check sugars fasting daily. Recommend check feet daily. Recommend annual eye exams. Medicines: Metformin 500 mg daily. Continue crestor 5 mg before bed and asa 325 mg daily.  Continue to work on eating a healthy diet and exercise.  Labs drawn today.     Orders: -     Hemoglobin A1c -     Lipid panel  Morbid obesity with body mass index (BMI) of 45.0 to 49.9 in adult [facility]) Assessment & Plan: Recommend continue to work on eating healthy diet and exercise.       No orders of the defined types were placed in this encounter.   Orders Placed This Encounter  Procedures   CBC with Differential/Platelet   Comprehensive metabolic panel   Hemoglobin A1c   Lipid panel     Follow-up: Return in about 3 months (around 02/23/2024) for chronic follow up.   I,Marla I Leal-Borjas,acting as a scribe for Blane Ohara, MD.,have documented all relevant documentation on the behalf of Blane Ohara, MD,as directed by  Blane Ohara, MD while in the presence of Blane Ohara, MD.   An After Visit Summary was printed and given to the patient.  I attest that I have reviewed this visit and agree with the plan scribed by my staff.   Blane Ohara, MD [facility] [phone]

## 2023-11-25 ENCOUNTER — Encounter: Payer: Self-pay | Admitting: Family Medicine

## 2023-11-25 ENCOUNTER — Ambulatory Visit (INDEPENDENT_AMBULATORY_CARE_PROVIDER_SITE_OTHER): Payer: Medicaid Other | Admitting: Family Medicine

## 2023-11-25 VITALS — BP 118/82 | HR 99 | Temp 97.6°F | Ht 65.0 in | Wt 285.0 lb

## 2023-11-25 DIAGNOSIS — E785 Hyperlipidemia, unspecified: Secondary | ICD-10-CM | POA: Diagnosis not present

## 2023-11-25 DIAGNOSIS — I10 Essential (primary) hypertension: Secondary | ICD-10-CM | POA: Diagnosis not present

## 2023-11-25 DIAGNOSIS — E1169 Type 2 diabetes mellitus with other specified complication: Secondary | ICD-10-CM | POA: Diagnosis not present

## 2023-11-25 DIAGNOSIS — Z6841 Body Mass Index (BMI) 40.0 and over, adult: Secondary | ICD-10-CM

## 2023-11-26 LAB — CBC WITH DIFFERENTIAL/PLATELET
Basophils Absolute: 0.1 10*3/uL (ref 0.0–0.2)
Basos: 1 %
EOS (ABSOLUTE): 0.1 10*3/uL (ref 0.0–0.4)
Eos: 2 %
Hematocrit: 39.2 % (ref 34.0–46.6)
Hemoglobin: 11.9 g/dL (ref 11.1–15.9)
Immature Grans (Abs): 0 10*3/uL (ref 0.0–0.1)
Immature Granulocytes: 0 %
Lymphocytes Absolute: 1.6 10*3/uL (ref 0.7–3.1)
Lymphs: 20 %
MCH: 23.7 pg — ABNORMAL LOW (ref 26.6–33.0)
MCHC: 30.4 g/dL — ABNORMAL LOW (ref 31.5–35.7)
MCV: 78 fL — ABNORMAL LOW (ref 79–97)
Monocytes Absolute: 0.7 10*3/uL (ref 0.1–0.9)
Monocytes: 9 %
Neutrophils Absolute: 5.5 10*3/uL (ref 1.4–7.0)
Neutrophils: 68 %
Platelets: 303 10*3/uL (ref 150–450)
RBC: 5.03 x10E6/uL (ref 3.77–5.28)
RDW: 15.1 % (ref 11.7–15.4)
WBC: 8.1 10*3/uL (ref 3.4–10.8)

## 2023-11-26 LAB — COMPREHENSIVE METABOLIC PANEL
ALT: 11 [IU]/L (ref 0–32)
AST: 18 [IU]/L (ref 0–40)
Albumin: 4.3 g/dL (ref 3.9–4.9)
Alkaline Phosphatase: 61 [IU]/L (ref 44–121)
BUN/Creatinine Ratio: 18 (ref 12–28)
BUN: 20 mg/dL (ref 8–27)
Bilirubin Total: 0.3 mg/dL (ref 0.0–1.2)
CO2: 19 mmol/L — ABNORMAL LOW (ref 20–29)
Calcium: 9.5 mg/dL (ref 8.7–10.3)
Chloride: 107 mmol/L — ABNORMAL HIGH (ref 96–106)
Creatinine, Ser: 1.14 mg/dL — ABNORMAL HIGH (ref 0.57–1.00)
Globulin, Total: 2.7 g/dL (ref 1.5–4.5)
Glucose: 101 mg/dL — ABNORMAL HIGH (ref 70–99)
Potassium: 4.7 mmol/L (ref 3.5–5.2)
Sodium: 140 mmol/L (ref 134–144)
Total Protein: 7 g/dL (ref 6.0–8.5)
eGFR: 54 mL/min/{1.73_m2} — ABNORMAL LOW (ref >59)

## 2023-11-26 LAB — LIPID PANEL
Chol/HDL Ratio: 2.6 {ratio} (ref 0.0–4.4)
Cholesterol, Total: 115 mg/dL (ref 100–199)
HDL: 44 mg/dL (ref >39)
LDL Chol Calc (NIH): 59 mg/dL (ref 0–99)
Triglycerides: 51 mg/dL (ref 0–149)
VLDL Cholesterol Cal: 12 mg/dL (ref 5–40)

## 2023-11-26 LAB — HEMOGLOBIN A1C
Est. average glucose Bld gHb Est-mCnc: 134 mg/dL
Hgb A1c MFr Bld: 6.3 % — ABNORMAL HIGH (ref 4.8–5.6)

## 2023-11-30 NOTE — Assessment & Plan Note (Signed)
Well controlled.  No changes to medicines. Valsartan-Hctz 320 mg daily, amlodipine 10 mg daily. Continue to work on eating a healthy diet and exercise.  Labs drawn today.

## 2023-11-30 NOTE — Assessment & Plan Note (Signed)
Control: diabetes good. Hyperlipidemia good.  Recommend check sugars fasting daily. Recommend check feet daily. Recommend annual eye exams. Medicines: Metformin 500 mg daily. Continue crestor 5 mg before bed and asa 325 mg daily.  Continue to work on eating a healthy diet and exercise.  Labs drawn today.

## 2023-11-30 NOTE — Assessment & Plan Note (Signed)
Recommend continue to work on eating healthy diet and exercise.  

## 2023-12-11 DIAGNOSIS — F419 Anxiety disorder, unspecified: Secondary | ICD-10-CM | POA: Diagnosis not present

## 2023-12-16 ENCOUNTER — Other Ambulatory Visit: Payer: Self-pay

## 2023-12-17 ENCOUNTER — Encounter: Payer: Self-pay | Admitting: Cardiology

## 2023-12-17 ENCOUNTER — Ambulatory Visit: Payer: Medicaid Other | Attending: Cardiology | Admitting: Cardiology

## 2023-12-17 VITALS — BP 140/80 | HR 82 | Ht 65.0 in | Wt 288.6 lb

## 2023-12-17 DIAGNOSIS — E785 Hyperlipidemia, unspecified: Secondary | ICD-10-CM

## 2023-12-17 DIAGNOSIS — E1169 Type 2 diabetes mellitus with other specified complication: Secondary | ICD-10-CM | POA: Diagnosis not present

## 2023-12-17 DIAGNOSIS — I10 Essential (primary) hypertension: Secondary | ICD-10-CM | POA: Diagnosis not present

## 2023-12-17 NOTE — Progress Notes (Signed)
 Cardiology Office Note:    Date:  12/17/2023   ID:  [name], DOB 04/11/1961, MRN [id number]  PCP:  Sherre Clapper, MD  Cardiologist:  Jennifer JONELLE Crape, MD   Referring MD: Sherre Clapper, MD    ASSESSMENT:    1. Essential hypertension   2. Dyslipidemia associated with type 2 diabetes mellitus (HCC)   3. Morbid obesity (HCC)     PLAN:    In order of problems listed above:  Primary prevention stressed with the patient.  Importance of compliance with diet medication stressed and patient verbalized standing. Essential hypertension: Blood pressure stable and diet was emphasized.  Lifestyle modification urged.  Salt intake issues were discussed.  I increased amlodipine  to 10 mg daily.  She will keep a track of pulse blood pressure at home and get back to us  in a week. Mixed dyslipidemia: On lipid-lowering medications followed by primary care.  Lipids reviewed and discussed with the patient at length. Diabetes mellitus: Managed by primary care.  Diet emphasized. Morbid obesity: Weight reduction stressed.  Diet emphasized and she promises to do better.  Risks of obesity explained.Patient will be seen in follow-up appointment in 6 months or earlier if the patient has any concerns.    Medication Adjustments/Labs and Tests Ordered: Current medicines are reviewed at length with the patient today.  Concerns regarding medicines are outlined above.  No orders of the defined types were placed in this encounter.  No orders of the defined types were placed in this encounter.    No chief complaint on file.    History of Present Illness:    Virginia Barron is a 63 y.o. female.  Patient has past medical history of essential hypertension, mixed dyslipidemia and morbid obesity.  She denies any problems at this time and takes care of activities of daily living.  No chest pain orthopnea or PND.  She has significant orthopedic issues which is why she does not ambulate much.  At the time of my  evaluation, the patient is alert awake oriented and in no distress.  Past Medical History:  Diagnosis Date   Chronic diastolic heart failure (HCC) 11/18/2022   Dyslipidemia associated with type 2 diabetes mellitus (HCC) 09/09/2021   Encounter for hepatitis C screening test for low risk patient 08/17/2023   Encounter for immunization 08/17/2023   Essential hypertension 08/15/2020   Fibroids    Immunization due 08/17/2023   Morbid obesity with body mass index (BMI) of 45.0 to 49.9 in adult [facility]) 07/30/2020   Morbid obesity with body mass index (BMI) of 50.0 to 59.9 in adult (HCC) 07/30/2020   OSA (obstructive sleep apnea) 07/30/2020   Primary osteoarthritis of left knee 11/18/2022   Screening for HIV without presence of risk factors 08/17/2023   Urge incontinence 07/30/2020    Past Surgical History:  Procedure Laterality Date   ABDOMINAL HYSTERECTOMY     precancerous cervical cells and fibroids.   conescopy     abnormal pap   LASIK     TUBAL LIGATION      Current Medications: Current Meds  Medication Sig   acetaminophen (TYLENOL) 500 MG tablet Take 500 mg by mouth every 6 (six) hours as needed for moderate pain (pain score 4-6) or mild pain (pain score 1-3).   amLODipine  (NORVASC ) 10 MG tablet Take 0.5 tablets (5 mg total) by mouth daily.   aspirin EC 325 MG tablet Take 325 mg by mouth daily.   Blood Glucose Monitoring Suppl DEVI Use to check glucose  up to twice daily. May substitute to any manufacturer covered by patient's insurance.   cholecalciferol (VITAMIN D3) 25 MCG (1000 UNIT) tablet Take 400 Units by mouth daily.    escitalopram  (LEXAPRO ) 20 MG tablet Take 20 mg by mouth daily.   Glucose Blood (BLOOD GLUCOSE TEST STRIPS) STRP Use to check glucose up to twice daily. May substitute to any manufacturer covered by patient's insurance.   ketoconazole  (NIZORAL ) 2 % cream APPLY TOPICALLY TO AFFECTED AREA DAILY.   Lancet Device MISC Use to check glucose up to twice daily. May  substitute to any manufacturer covered by patient's insurance.   Lancets Misc. MISC Use to check glucose up to twice daily. May substitute to any manufacturer covered by patient's insurance.   metFORMIN  (GLUCOPHAGE ) 500 MG tablet TAKE 1 TABLET BY MOUTH EVERY DAY WITH BREAKFAST   Multiple Vitamin (MULTIVITAMIN) tablet Take 1 tablet by mouth daily.   rosuvastatin  (CRESTOR ) 5 MG tablet Take 1 tablet (5 mg total) by mouth daily.   triamcinolone  cream (KENALOG ) 0.1 % APPLY 1 APPLICATION TOPICALLY 2 TIMES DAILY FOR 15 DAYS.   valsartan  (DIOVAN ) 320 MG tablet Take 1 tablet (320 mg total) by mouth daily.   Current Facility-Administered Medications for the 12/17/23 encounter (Office Visit) with Antavius Sperbeck, Jennifer SAUNDERS, MD  Medication   triamcinolone  acetonide (KENALOG -40) injection 80 mg     Allergies:   Avocado, Iodine, Kiwi extract, Penicillins, Pork-derived products, Shellfish allergy, and Latex   Social History   Socioeconomic History   Marital status: Divorced    Spouse name: Not on file   Number of children: Not on file   Years of education: Not on file   Highest education level: Some college, no degree  Occupational History   Not on file  Tobacco Use   Smoking status: Former    Current packs/day: 0.00    Types: Cigarettes    Quit date: 1980    Years since quitting: 45.0   Smokeless tobacco: Never  Substance and Sexual Activity   Alcohol use: Yes    Alcohol/week: 1.0 standard drink of alcohol    Types: 1 Standard drinks or equivalent per week    Comment: per month   Drug use: Never   Sexual activity: Not on file  Other Topics Concern   Not on file  Social History Narrative   Not on file   Social Drivers of Health   Financial Resource Strain: Medium Risk (11/23/2023)   Overall Financial Resource Strain (CARDIA)    Difficulty of Paying Living Expenses: Somewhat hard  Food Insecurity: Food Insecurity Present (11/23/2023)   Hunger Vital Sign    Worried About Running Out of Food in  the Last Year: Sometimes true    Ran Out of Food in the Last Year: Sometimes true  Transportation Needs: Unmet Transportation Needs (11/23/2023)   PRAPARE - Administrator, Civil Service (Medical): Yes    Lack of Transportation (Non-Medical): Yes  Physical Activity: Inactive (11/23/2023)   Exercise Vital Sign    Days of Exercise per Week: 0 days    Minutes of Exercise per Session: 30 min  Stress: Stress Concern Present (11/23/2023)   [employer] of Occupational Health - Occupational Stress Questionnaire    Feeling of Stress : Rather much  Social Connections: Moderately Integrated (11/23/2023)   Social Connection and Isolation Panel [NHANES]    Frequency of Communication with Friends and Family: Twice a week    Frequency of Social Gatherings with Friends and Family: Once  a week    Attends Religious Services: 1 to 4 times per year    Active Member of Clubs or Organizations: Yes    Attends Banker Meetings: 1 to 4 times per year    Marital Status: Divorced     Family History: The patient's family history includes Diabetes in her father, mother, and sister; Heart attack in her father. There is no history of Breast cancer.  ROS:   Please see the history of present illness.    All other systems reviewed and are negative.  EKGs/Labs/Other Studies Reviewed:    The following studies were reviewed today: I discussed my findings with the patient at length.   Recent Labs: 04/23/2023: NT-Pro BNP <36; TSH 1.590 11/25/2023: ALT 11; BUN 20; Creatinine, Ser 1.14; Hemoglobin 11.9; Platelets 303; Potassium 4.7; Sodium 140  Recent Lipid Panel    Component Value Date/Time   CHOL 115 11/25/2023 0943   TRIG 51 11/25/2023 0943   HDL 44 11/25/2023 0943   CHOLHDL 2.6 11/25/2023 0943   LDLCALC 59 11/25/2023 0943    Physical Exam:    VS:  BP (!) 143/90 (BP Location: Right Arm, Patient Position: Sitting, Cuff Size: Normal)   Pulse 82   Ht 5' 5 (1.651 m)   Wt 288  lb 9.6 oz (130.9 kg)   SpO2 94%   BMI 48.03 kg/m     Wt Readings from Last 3 Encounters:  12/17/23 288 lb 9.6 oz (130.9 kg)  11/25/23 285 lb (129.3 kg)  08/13/23 287 lb (130.2 kg)     GEN: Patient is in no acute distress HEENT: Normal NECK: No JVD; No carotid bruits LYMPHATICS: No lymphadenopathy CARDIAC: Hear sounds regular, 2/6 systolic murmur at the apex. RESPIRATORY:  Clear to auscultation without rales, wheezing or rhonchi  ABDOMEN: Soft, non-tender, non-distended MUSCULOSKELETAL:  No edema; No deformity  SKIN: Warm and dry NEUROLOGIC:  Alert and oriented x 3 PSYCHIATRIC:  Normal affect   Signed, Jennifer JONELLE Crape, MD  12/17/2023 3:07 PM    [facility]

## 2023-12-17 NOTE — Patient Instructions (Addendum)
 Please keep a BP log for 2 weeks and send by MyChart or mail.                      Blood Pressure Record Sheet To take your blood pressure, you will need a blood pressure machine. You can buy a blood pressure machine (blood pressure monitor) at your clinic, drug store, or online. When choosing one, consider: An automatic monitor that has an arm cuff. A cuff that wraps snugly around your upper arm. You should be able to fit only one finger between your arm and the cuff. A device that stores blood pressure reading results. Do not choose a monitor that measures your blood pressure from your wrist or finger. Follow your health care provider's instructions for how to take your blood pressure. To use this form: Get one reading in the morning (a.m.) 1-2 hours after you take any medicines. Medication Instructions:  Your physician recommends that you continue on your current medications as directed. Please refer to the Current Medication list given to you today.  *If you need a refill on your cardiac medications before your next appointment, please call your pharmacy*  Lab Work: None Ordered If you have labs (blood work) drawn today and your tests are completely normal, you will receive your results only by: MyChart Message (if you have MyChart) OR A paper copy in the mail If you have any lab test that is abnormal or we need to change your treatment, we will call you to review the results.   Testing/Procedures: None Ordered   Follow-Up: At [facility], you and your health needs are our priority.  As part of our continuing mission to provide you with exceptional heart care, we have created designated Provider Care Teams.  These Care Teams include your primary Cardiologist (physician) and Advanced Practice Providers (APPs -  Physician Assistants and Nurse Practitioners) who all work together to provide you with the care you need, when you need it.  We recommend signing up for the patient portal  called MyChart.  Sign up information is provided on this After Visit Summary.  MyChart is used to connect with patients for Virtual Visits (Telemedicine).  Patients are able to view lab/test results, encounter notes, upcoming appointments, etc.  Non-urgent messages can be sent to your provider as well.   To learn more about what you can do with MyChart, go to [email or web address].    Your next appointment:   12 month follow up

## 2023-12-24 ENCOUNTER — Other Ambulatory Visit: Payer: Self-pay | Admitting: Cardiology

## 2023-12-24 ENCOUNTER — Other Ambulatory Visit: Payer: Self-pay | Admitting: Family Medicine

## 2023-12-24 DIAGNOSIS — F419 Anxiety disorder, unspecified: Secondary | ICD-10-CM | POA: Diagnosis not present

## 2023-12-24 MED ORDER — VALSARTAN 320 MG PO TABS: 320.0000 mg | ORAL_TABLET | Freq: Every day | ORAL | 0 refills | Status: DC

## 2023-12-24 MED ORDER — VITAMIN D 25 MCG (1000 UNIT) PO TABS: 1000.0000 [IU] | ORAL_TABLET | Freq: Every day | ORAL | 1 refills | Status: DC

## 2023-12-24 MED ORDER — ESCITALOPRAM OXALATE 20 MG PO TABS: 20.0000 mg | ORAL_TABLET | Freq: Every day | ORAL | 0 refills | Status: DC

## 2023-12-31 DIAGNOSIS — F419 Anxiety disorder, unspecified: Secondary | ICD-10-CM | POA: Diagnosis not present

## 2024-01-04 ENCOUNTER — Encounter: Payer: Self-pay | Admitting: Cardiology

## 2024-01-07 DIAGNOSIS — F419 Anxiety disorder, unspecified: Secondary | ICD-10-CM | POA: Diagnosis not present

## 2024-01-09 ENCOUNTER — Other Ambulatory Visit: Payer: Self-pay | Admitting: Family Medicine

## 2024-01-09 DIAGNOSIS — E119 Type 2 diabetes mellitus without complications: Secondary | ICD-10-CM

## 2024-01-20 DIAGNOSIS — F331 Major depressive disorder, recurrent, moderate: Secondary | ICD-10-CM | POA: Diagnosis not present

## 2024-02-02 ENCOUNTER — Other Ambulatory Visit: Payer: Self-pay | Admitting: Family Medicine

## 2024-02-04 DIAGNOSIS — F419 Anxiety disorder, unspecified: Secondary | ICD-10-CM | POA: Diagnosis not present

## 2024-02-06 LAB — HM DIABETES EYE EXAM

## 2024-02-16 ENCOUNTER — Other Ambulatory Visit: Payer: Self-pay | Admitting: Family Medicine

## 2024-02-16 DIAGNOSIS — E1169 Type 2 diabetes mellitus with other specified complication: Secondary | ICD-10-CM

## 2024-02-23 NOTE — Progress Notes (Signed)
 Subjective:  Patient ID: Virginia Barron, female    DOB: [birth year]  Age: 63 y.o. MRN: [id number]  Chief Complaint  Patient presents with   Medical Management of Chronic Issues   Discussed the use of AI scribe software for clinical note transcription with the patient, who gave verbal consent to proceed.  History of Present Illness   Virginia Blakesley "Karoline Caldwell" is a 63 year old female with diabetes and hypertension who presents for routine follow-up.  She manages her type 2 diabetes with daily blood sugar monitoring, typically before taking her medication, and more frequently when feeling unwell. Her blood sugar levels range from 93 to 124 mg/dL, and her most recent hemoglobin A1c is 6.3%. She had an eye exam in February 2025, with the previous one in January 2024. She checks her feet daily and does not experience numbness. Her current medications include metformin twice daily.  Hypertension: on amlodipine 10 mg once daily, valsartan 320 mg once daily. Hyperlipidemia: on Crestor 5 mg once daily. She also takes an adult aspirin as needed, not daily.  She experiences anxiety, which has increased in frequency from once a month to three or four times a month, particularly when she cannot sleep. She is under the care of a psychiatrist in [place] and is prescribed hydroxyzine for major anxiety attacks, though it is no longer effective. No panic attacks reported.  She has a headache today, which she attributes to recent vaccinations. No fevers, chills, sweats, earaches, sore throats, stuffy nose, chest pain, breathing problems, or numbness in her feet.         11/25/2023    9:12 AM 08/13/2023    9:11 AM 04/23/2023    7:44 AM 11/21/2022   10:07 AM 11/18/2022    3:42 PM  Depression screen PHQ 2/9  Decreased Interest 2 0 1 1 1   Down, Depressed, Hopeless 2 0 1 1 0  PHQ - 2 Score 4 0 2 2 1   Altered sleeping 1 0 0 2 2  Tired, decreased energy 3 0 3 1 1   Change in appetite 0 0 1 0 0  Feeling bad  or failure about yourself  0 0 0 0 0  Trouble concentrating 0 0 2 1 1   Moving slowly or fidgety/restless 0 0 0 0 0  Suicidal thoughts 2 0 0 0 0  PHQ-9 Score 10 0 8 6 5   Difficult doing work/chores Somewhat difficult Not difficult at all Not difficult at all Somewhat difficult Somewhat difficult        08/13/2023    9:11 AM  Fall Risk   Falls in the past year? 1  Number falls in past yr: 0  Injury with Fall? 0  Risk for fall due to : Impaired mobility;Impaired balance/gait  Follow up Falls evaluation completed    Patient Care Team: Blane Ohara, MD as PCP - General (Family Medicine) Thomasene Ripple, DO as PCP - Cardiology (Cardiology) Gabriel Carina, [facility] (Inactive) (Pharmacist)   Review of Systems  Constitutional:  Negative for chills, fatigue and fever.  HENT:  Negative for congestion, ear pain, rhinorrhea and sore throat.   Respiratory:  Negative for cough and shortness of breath.   Cardiovascular:  Negative for chest pain.  Gastrointestinal:  Negative for abdominal pain, constipation, diarrhea, nausea and vomiting.  Genitourinary:  Negative for dysuria and urgency.  Musculoskeletal:  Negative for back pain and myalgias.  Neurological:  Negative for dizziness, weakness, light-headedness and headaches.  Psychiatric/Behavioral:  Negative for dysphoric mood. The  patient is nervous/anxious.     Current Outpatient Medications on File Prior to Visit  Medication Sig Dispense Refill   acetaminophen (TYLENOL) 500 MG tablet Take 500 mg by mouth every 6 (six) hours as needed for moderate pain (pain score 4-6) or mild pain (pain score 1-3).     amLODipine (NORVASC) 10 MG tablet Take 0.5 tablets (5 mg total) by mouth daily. 45 tablet 3   aspirin EC 325 MG tablet Take 325 mg by mouth daily.     Blood Glucose Monitoring Suppl DEVI Use to check glucose up to twice daily. May substitute to any manufacturer covered by patient's insurance. 1 each 0   cholecalciferol (VITAMIN D3) 25 MCG (1000 UNIT)  tablet Take 1 tablet (1,000 Units total) by mouth daily. 30 tablet 1   escitalopram (LEXAPRO) 20 MG tablet TAKE 1 TABLET BY MOUTH DAILY. 90 tablet 0   Glucose Blood (BLOOD GLUCOSE TEST STRIPS) STRP Use to check glucose up to twice daily. May substitute to any manufacturer covered by patient's insurance. 100 strip 3   ketoconazole (NIZORAL) 2 % cream APPLY TOPICALLY TO AFFECTED AREA DAILY. 60 g 0   Lancet Device MISC Use to check glucose up to twice daily. May substitute to any manufacturer covered by patient's insurance. 1 each 2   Lancets Misc. MISC Use to check glucose up to twice daily. May substitute to any manufacturer covered by patient's insurance. 100 each 2   metFORMIN (GLUCOPHAGE) 500 MG tablet TAKE 1 TABLET BY MOUTH EVERY DAY WITH BREAKFAST 90 tablet 0   Multiple Vitamin (MULTIVITAMIN) tablet Take 1 tablet by mouth daily.     rosuvastatin (CRESTOR) 5 MG tablet TAKE 1 TABLET BY MOUTH DAILY. 90 tablet 2   triamcinolone cream (KENALOG) 0.1 % APPLY 1 APPLICATION TOPICALLY 2 TIMES DAILY FOR 15 DAYS. 30 g 0   valsartan (DIOVAN) 320 MG tablet Take 1 tablet (320 mg total) by mouth daily. 90 tablet 0   Current Facility-Administered Medications on File Prior to Visit  Medication Dose Route Frequency Provider Last Rate Last Admin   triamcinolone acetonide (KENALOG-40) injection 80 mg  80 mg Intra-articular Once Blane Ohara, MD       Past Medical History:  Diagnosis Date   Chronic diastolic heart failure (HCC) 11/18/2022   Dyslipidemia associated with type 2 diabetes mellitus (HCC) 09/09/2021   Encounter for hepatitis C screening test for low risk patient 08/17/2023   Encounter for immunization 08/17/2023   Essential hypertension 08/15/2020   Fibroids    Immunization due 08/17/2023   Morbid obesity with body mass index (BMI) of 45.0 to 49.9 in adult [facility]) 07/30/2020   Morbid obesity with body mass index (BMI) of 50.0 to 59.9 in adult (HCC) 07/30/2020   OSA (obstructive sleep apnea)  07/30/2020   Primary osteoarthritis of left knee 11/18/2022   Screening for HIV without presence of risk factors 08/17/2023   Urge incontinence 07/30/2020   Past Surgical History:  Procedure Laterality Date   ABDOMINAL HYSTERECTOMY     precancerous cervical cells and fibroids.   conescopy     abnormal pap   LASIK     TUBAL LIGATION      Family History  Problem Relation Age of Onset   Diabetes Mother    Diabetes Father    Heart attack Father    Diabetes Sister    Breast cancer Neg Hx    Social History   Socioeconomic History   Marital status: Divorced    Spouse name:  Not on file   Number of children: Not on file   Years of education: Not on file   Highest education level: Some college, no degree  Occupational History   Not on file  Tobacco Use   Smoking status: Former    Current packs/day: 0.00    Types: Cigarettes    Quit date: 1980    Years since quitting: 45.2   Smokeless tobacco: Never  Substance and Sexual Activity   Alcohol use: Yes    Alcohol/week: 1.0 standard drink of alcohol    Types: 1 Standard drinks or equivalent per week    Comment: per month   Drug use: Never   Sexual activity: Not on file  Other Topics Concern   Not on file  Social History Narrative   Not on file   Social Drivers of Health   Financial Resource Strain: Medium Risk (11/23/2023)   Overall Financial Resource Strain (CARDIA)    Difficulty of Paying Living Expenses: Somewhat hard  Food Insecurity: Food Insecurity Present (11/23/2023)   Hunger Vital Sign    Worried About Running Out of Food in the Last Year: Sometimes true    Ran Out of Food in the Last Year: Sometimes true  Transportation Needs: Unmet Transportation Needs (11/23/2023)   PRAPARE - Administrator, Civil Service (Medical): Yes    Lack of Transportation (Non-Medical): Yes  Physical Activity: Inactive (11/23/2023)   Exercise Vital Sign    Days of Exercise per Week: 0 days    Minutes of Exercise per  Session: 30 min  Stress: Stress Concern Present (11/23/2023)   [employer] of Occupational Health - Occupational Stress Questionnaire    Feeling of Stress : Rather much  Social Connections: Moderately Integrated (11/23/2023)   Social Connection and Isolation Panel [NHANES]    Frequency of Communication with Friends and Family: Twice a week    Frequency of Social Gatherings with Friends and Family: Once a week    Attends Religious Services: 1 to 4 times per year    Active Member of [employer] or Organizations: Yes    Attends Banker Meetings: 1 to 4 times per year    Marital Status: Divorced    Objective:  BP 120/78   Pulse 71   Temp 97.6 F (36.4 C)   Ht 5\' 5"  (1.651 m)   Wt 298 lb (135.2 kg)   SpO2 97%   BMI 49.59 kg/m      02/24/2024    9:18 AM 12/17/2023    3:29 PM 12/17/2023    2:49 PM  BP/Weight  Systolic BP 120 140 143  Diastolic BP 78 80 90  Wt. (Lbs) 298  288.6  BMI 49.59 kg/m2  48.03 kg/m2    Physical Exam Vitals reviewed.  Constitutional:      Appearance: Normal appearance. She is obese.  Neck:     Vascular: No carotid bruit.  Cardiovascular:     Rate and Rhythm: Normal rate and regular rhythm.     Pulses: Normal pulses.     Heart sounds: Normal heart sounds.  Pulmonary:     Effort: Pulmonary effort is normal. No respiratory distress.     Breath sounds: Normal breath sounds.  Abdominal:     General: Abdomen is flat. Bowel sounds are normal.     Palpations: Abdomen is soft.     Tenderness: There is no abdominal tenderness.  Neurological:     Mental Status: She is alert and oriented to person, place,  and time.  Psychiatric:        Mood and Affect: Mood normal.        Behavior: Behavior normal.     Diabetic Foot Exam - Simple   Simple Foot Form Diabetic Foot exam was performed with the following findings: Yes 02/23/2024  9:38 PM  Visual Inspection See comments: Yes Sensation Testing Intact to touch and monofilament testing  bilaterally: Yes Pulse Check Posterior Tibialis and Dorsalis pulse intact bilaterally: Yes Comments Thickened nails. Calluses.       Lab Results  Component Value Date   WBC 7.9 02/24/2024   HGB 12.6 02/24/2024   HCT 42.4 02/24/2024   PLT 325 02/24/2024   GLUCOSE 89 02/24/2024   CHOL 118 02/24/2024   TRIG 65 02/24/2024   HDL 45 02/24/2024   LDLCALC 59 02/24/2024   ALT 11 02/24/2024   AST 17 02/24/2024   NA 143 02/24/2024   K 4.2 02/24/2024   CL 105 02/24/2024   CREATININE 1.04 (H) 02/24/2024   BUN 11 02/24/2024   CO2 23 02/24/2024   TSH 1.590 04/23/2023   HGBA1C 6.1 (H) 02/24/2024   MICROALBUR 10 09/03/2021      Assessment & Plan:  Essential hypertension Assessment & Plan: On amlodipine 10 mg once daily and valsartan 320 mg once daily for blood pressure management, indicating ongoing management of hypertension. - Continue amlodipine 10 mg once daily - Continue valsartan 320 mg once daily  Orders: -     CBC with Differential/Platelet -     Comprehensive metabolic panel  Dyslipidemia associated with type 2 diabetes mellitus (HCC) Assessment & Plan: Diabetes and hyperlipidemia well controlled.  Blood glucose levels range from 93 to 124 mg/dL, and ZOX0R is 6.0%, indicating well-controlled diabetes. She checks blood sugar daily, primarily fasting, and sporadically when feeling unwell. On metformin twice daily and advised to check feet daily. - Continue metformin twice daily - Continue crestor 5 mg before bed.  - Perform daily foot checks - Order blood work and urine sample  Orders: -     Hemoglobin A1c -     Lipid panel -     Microalbumin / creatinine urine ratio  Encounter for immunization -     Pneumococcal conjugate vaccine 20-valent -     Tdap vaccine greater than or equal to 7yo IM -     Pfizer Comirnaty Covid-19 Vaccine 53yrs & older  Encounter for screening mammogram for malignant neoplasm of breast -     3D Screening Mammogram, Left and Right;  Future  GAD (generalized anxiety disorder) Assessment & Plan: Experiences anxiety, with episodes increasing from once a month to three or four times a month, particularly affecting sleep. Under the care of a psychiatrist in [place] and prescribed hydroxyzine for acute anxiety episodes, which is no longer effective. - Discuss medication adjustment with psychiatrist   Morbid obesity with body mass index (BMI) of 45.0 to 49.9 in adult [facility]) Assessment & Plan: Recommend continue to work on eating healthy diet and exercise.       No orders of the defined types were placed in this encounter.   Orders Placed This Encounter  Procedures   MM 3D SCREENING MAMMOGRAM BILATERAL BREAST   Pneumococcal conjugate vaccine 20-valent   Tdap vaccine greater than or equal to 7yo IM   Pfizer Comirnaty Covid-19 Vaccine 54yrs & older   CBC with Differential/Platelet   Comprehensive metabolic panel   Hemoglobin A1c   Lipid panel   Microalbumin / creatinine urine  ratio     Follow-up: Return in about 3 months (around 05/26/2024) for chronic follow up.   I,Marla I Leal-Borjas,acting as a scribe for Blane Ohara, MD.,have documented all relevant documentation on the behalf of Blane Ohara, MD,as directed by  Blane Ohara, MD while in the presence of Blane Ohara, MD.   An After Visit Summary was printed and given to the patient.  I attest that I have reviewed this visit and agree with the plan scribed by my staff.   Blane Ohara, MD [facility] [phone]

## 2024-02-24 ENCOUNTER — Encounter: Payer: Self-pay | Admitting: Family Medicine

## 2024-02-24 ENCOUNTER — Ambulatory Visit (INDEPENDENT_AMBULATORY_CARE_PROVIDER_SITE_OTHER): Payer: Medicaid Other | Admitting: Family Medicine

## 2024-02-24 VITALS — BP 120/78 | HR 71 | Temp 97.6°F | Ht 65.0 in | Wt 298.0 lb

## 2024-02-24 DIAGNOSIS — I10 Essential (primary) hypertension: Secondary | ICD-10-CM

## 2024-02-24 DIAGNOSIS — F411 Generalized anxiety disorder: Secondary | ICD-10-CM

## 2024-02-24 DIAGNOSIS — Z1231 Encounter for screening mammogram for malignant neoplasm of breast: Secondary | ICD-10-CM | POA: Diagnosis not present

## 2024-02-24 DIAGNOSIS — E1169 Type 2 diabetes mellitus with other specified complication: Secondary | ICD-10-CM | POA: Diagnosis not present

## 2024-02-24 DIAGNOSIS — Z6841 Body Mass Index (BMI) 40.0 and over, adult: Secondary | ICD-10-CM

## 2024-02-24 DIAGNOSIS — G4733 Obstructive sleep apnea (adult) (pediatric): Secondary | ICD-10-CM

## 2024-02-24 DIAGNOSIS — Z23 Encounter for immunization: Secondary | ICD-10-CM

## 2024-02-24 DIAGNOSIS — E785 Hyperlipidemia, unspecified: Secondary | ICD-10-CM | POA: Diagnosis not present

## 2024-02-24 DIAGNOSIS — I5032 Chronic diastolic (congestive) heart failure: Secondary | ICD-10-CM

## 2024-02-25 ENCOUNTER — Encounter: Payer: Self-pay | Admitting: Family Medicine

## 2024-02-25 LAB — LIPID PANEL
Chol/HDL Ratio: 2.6 ratio (ref 0.0–4.4)
Cholesterol, Total: 118 mg/dL (ref 100–199)
HDL: 45 mg/dL (ref >39)
LDL Chol Calc (NIH): 59 mg/dL (ref 0–99)
Triglycerides: 65 mg/dL (ref 0–149)
VLDL Cholesterol Cal: 14 mg/dL (ref 5–40)

## 2024-02-25 LAB — COMPREHENSIVE METABOLIC PANEL
ALT: 11 IU/L (ref 0–32)
AST: 17 IU/L (ref 0–40)
Albumin: 4.5 g/dL (ref 3.9–4.9)
Alkaline Phosphatase: 69 IU/L (ref 44–121)
BUN/Creatinine Ratio: 11 — ABNORMAL LOW (ref 12–28)
BUN: 11 mg/dL (ref 8–27)
Bilirubin Total: 0.2 mg/dL (ref 0.0–1.2)
CO2: 23 mmol/L (ref 20–29)
Calcium: 9.5 mg/dL (ref 8.7–10.3)
Chloride: 105 mmol/L (ref 96–106)
Creatinine, Ser: 1.04 mg/dL — ABNORMAL HIGH (ref 0.57–1.00)
Globulin, Total: 2.6 g/dL (ref 1.5–4.5)
Glucose: 89 mg/dL (ref 70–99)
Potassium: 4.2 mmol/L (ref 3.5–5.2)
Sodium: 143 mmol/L (ref 134–144)
Total Protein: 7.1 g/dL (ref 6.0–8.5)
eGFR: 60 mL/min/{1.73_m2} (ref >59)

## 2024-02-25 LAB — MICROALBUMIN / CREATININE URINE RATIO
Creatinine, Urine: 160 mg/dL
Microalb/Creat Ratio: 6 mg/g{creat} (ref 0–29)
Microalbumin, Urine: 9.4 ug/mL

## 2024-02-25 LAB — CBC WITH DIFFERENTIAL/PLATELET
Basophils Absolute: 0.1 10*3/uL (ref 0.0–0.2)
Basos: 2 %
EOS (ABSOLUTE): 0.3 10*3/uL (ref 0.0–0.4)
Eos: 3 %
Hematocrit: 42.4 % (ref 34.0–46.6)
Hemoglobin: 12.6 g/dL (ref 11.1–15.9)
Immature Grans (Abs): 0 10*3/uL (ref 0.0–0.1)
Immature Granulocytes: 0 %
Lymphocytes Absolute: 1.9 10*3/uL (ref 0.7–3.1)
Lymphs: 25 %
MCH: 23.9 pg — ABNORMAL LOW (ref 26.6–33.0)
MCHC: 29.7 g/dL — ABNORMAL LOW (ref 31.5–35.7)
MCV: 80 fL (ref 79–97)
Monocytes Absolute: 0.8 10*3/uL (ref 0.1–0.9)
Monocytes: 10 %
Neutrophils Absolute: 4.7 10*3/uL (ref 1.4–7.0)
Neutrophils: 60 %
Platelets: 325 10*3/uL (ref 150–450)
RBC: 5.28 x10E6/uL (ref 3.77–5.28)
RDW: 14.1 % (ref 11.7–15.4)
WBC: 7.9 10*3/uL (ref 3.4–10.8)

## 2024-02-25 LAB — HEMOGLOBIN A1C
Est. average glucose Bld gHb Est-mCnc: 128 mg/dL
Hgb A1c MFr Bld: 6.1 % — ABNORMAL HIGH (ref 4.8–5.6)

## 2024-02-28 DIAGNOSIS — F411 Generalized anxiety disorder: Secondary | ICD-10-CM

## 2024-02-28 DIAGNOSIS — E782 Mixed hyperlipidemia: Secondary | ICD-10-CM | POA: Insufficient documentation

## 2024-02-28 HISTORY — DX: Generalized anxiety disorder: F41.1

## 2024-02-28 NOTE — Assessment & Plan Note (Signed)
 On rosuvastatin 5 mg once daily. Plans to review cholesterol levels with upcoming lab results to determine if adjustments are needed. Has been out of rosuvastatin for a few days. - Refill rosuvastatin after reviewing lab results

## 2024-02-28 NOTE — Assessment & Plan Note (Signed)
 Experiences anxiety, with episodes increasing from once a month to three or four times a month, particularly affecting sleep. Under the care of a psychiatrist in [place] and prescribed hydroxyzine for acute anxiety episodes, which is no longer effective. - Discuss medication adjustment with psychiatrist

## 2024-02-28 NOTE — Assessment & Plan Note (Signed)
 Recommend continue to work on eating healthy diet and exercise.

## 2024-02-28 NOTE — Assessment & Plan Note (Addendum)
 Diabetes and hyperlipidemia well controlled.  Blood glucose levels range from 93 to 124 mg/dL, and ZOX0R is 6.0%, indicating well-controlled diabetes. She checks blood sugar daily, primarily fasting, and sporadically when feeling unwell. On metformin twice daily and advised to check feet daily. - Continue metformin twice daily - Continue crestor 5 mg before bed.  - Perform daily foot checks - Order blood work and urine sample

## 2024-02-28 NOTE — Assessment & Plan Note (Signed)
 On amlodipine 10 mg once daily and valsartan 320 mg once daily for blood pressure management, indicating ongoing management of hypertension. - Continue amlodipine 10 mg once daily - Continue valsartan 320 mg once daily

## 2024-03-09 ENCOUNTER — Ambulatory Visit: Payer: Medicaid Other | Admitting: Family Medicine

## 2024-03-10 DIAGNOSIS — F419 Anxiety disorder, unspecified: Secondary | ICD-10-CM | POA: Diagnosis not present

## 2024-03-23 DIAGNOSIS — F419 Anxiety disorder, unspecified: Secondary | ICD-10-CM | POA: Diagnosis not present

## 2024-03-25 ENCOUNTER — Other Ambulatory Visit: Payer: Self-pay | Admitting: Family Medicine

## 2024-03-25 DIAGNOSIS — E119 Type 2 diabetes mellitus without complications: Secondary | ICD-10-CM

## 2024-03-31 ENCOUNTER — Ambulatory Visit
Admission: RE | Admit: 2024-03-31 | Discharge: 2024-03-31 | Disposition: A | Discharge status: Home / Self Care | Source: Ambulatory Visit | Attending: Family Medicine | Admitting: Family Medicine

## 2024-03-31 DIAGNOSIS — Z1231 Encounter for screening mammogram for malignant neoplasm of breast: Secondary | ICD-10-CM | POA: Diagnosis not present

## 2024-04-05 ENCOUNTER — Encounter: Payer: Self-pay | Admitting: Family Medicine

## 2024-04-14 DIAGNOSIS — F419 Anxiety disorder, unspecified: Secondary | ICD-10-CM | POA: Diagnosis not present

## 2024-04-28 DIAGNOSIS — F419 Anxiety disorder, unspecified: Secondary | ICD-10-CM | POA: Diagnosis not present

## 2024-05-24 DIAGNOSIS — F419 Anxiety disorder, unspecified: Secondary | ICD-10-CM | POA: Diagnosis not present

## 2024-05-31 NOTE — Progress Notes (Signed)
 Subjective:  Patient ID: Virginia Barron, female    DOB: [birth year]  Age: 63 y.o. MRN: [id number]  Chief Complaint  Patient presents with   Medical Management of Chronic Issues    HPI: Diabetes:  Complications:hypertension, obesity.  Glucose checking fasting: checks sporadically when feels poorly. Glucose logs:96-121 Most recent A1C: 6.1 Current medications: metformin  500 mg once daily.  Last Eye Exam: 12/2022 Foot checks: daily  Hyperlipidemia: Current medications: crestor  5 mg once daily at night and aspirin 325 mg daily.   Hypertension: Current medications: amlodipine  10 mg 1/2 daily and valsartan  320 mg once daily.   Diet: tries to eat healthy. Exercise: unable to walk due to shortness of breath and exhaustion after minimal exertion.   GAD: on Lexapro  20 mg daily.  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   Vision Disturbance: for 4 weeks has had a squiggle in her left eye that moves with movement of her eye and for 1 week has had similar squiggle in right eye.      06/01/2024    8:46 AM 11/25/2023    9:12 AM 08/13/2023    9:11 AM 04/23/2023    7:44 AM 11/21/2022   10:07 AM  Depression screen PHQ 2/9  Decreased Interest 1 2 0 1 1  Down, Depressed, Hopeless 0 2 0 1 1  PHQ - 2 Score 1 4 0 2 2  Altered sleeping 1 1 0 0 2  Tired, decreased energy 1 3 0 3 1  Change in appetite 0 0 0 1 0  Feeling bad or failure about yourself  0 0 0 0 0  Trouble concentrating 1 0 0 2 1  Moving slowly or fidgety/restless 0 0 0 0 0  Suicidal thoughts 0 2 0 0 0  PHQ-9 Score 4 10 0 8 6  Difficult doing work/chores Not difficult at all Somewhat difficult Not difficult at all Not difficult at all Somewhat difficult        06/01/2024    8:46 AM  Fall Risk   Falls in the past year? 0  Number falls in past yr: 0  Injury with Fall? 0  Risk for fall due to : No Fall Risks  Follow up Falls evaluation completed    Patient Care Team: Sherre Clapper, MD as PCP - General (Family Medicine) Tobb,  Kardie, DO as PCP - Cardiology (Cardiology) Beverli Cara PARAS, [facility] (Inactive) (Pharmacist)   Review of Systems  Constitutional:  Negative for chills, fatigue and fever.  HENT:  Negative for congestion, ear pain, rhinorrhea and sore throat.   Eyes:  Positive for visual disturbance (Floaters x 1 month in left eye and 1 week in right eye).  Respiratory:  Negative for cough and shortness of breath.   Cardiovascular:  Negative for chest pain.  Gastrointestinal:  Negative for abdominal pain, constipation, diarrhea, nausea and vomiting.  Genitourinary:  Negative for dysuria and urgency.  Musculoskeletal:  Positive for arthralgias (right shoulder: anterior - lateral discomfort. Hurts with driving and hands on wheel.). Negative for back pain and myalgias.       Aspirin 325 mg once sporadically.   Neurological:  Negative for dizziness, weakness, light-headedness and headaches.  Psychiatric/Behavioral:  Negative for dysphoric mood. The patient is not nervous/anxious.     Current Outpatient Medications on File Prior to Visit  Medication Sig Dispense Refill   ACCU-CHEK GUIDE TEST test strip USE TO CHECK GLUCOSE UP TO TWICE DAILY. 100 each 2   Accu-Chek Softclix Lancets lancets USE TO CHECK BLOOD SUGAR  THREE TIMES DAILY 100 each 2   acetaminophen (TYLENOL) 500 MG tablet Take 500 mg by mouth every 6 (six) hours as needed for moderate pain (pain score 4-6) or mild pain (pain score 1-3).     amLODipine  (NORVASC ) 10 MG tablet Take 0.5 tablets (5 mg total) by mouth daily. 45 tablet 3   Blood Glucose Monitoring Suppl DEVI Use to check glucose up to twice daily. May substitute to any manufacturer covered by patient's insurance. 1 each 0   cholecalciferol (VITAMIN D3) 25 MCG (1000 UNIT) tablet Take 1 tablet (1,000 Units total) by mouth daily. 30 tablet 1   escitalopram  (LEXAPRO ) 20 MG tablet TAKE 1 TABLET BY MOUTH DAILY. 90 tablet 0   Lancet Device MISC Use to check glucose up to twice daily. May substitute to any  manufacturer covered by patient's insurance. 1 each 2   metFORMIN  (GLUCOPHAGE ) 500 MG tablet TAKE 1 TABLET BY MOUTH EVERY DAY WITH BREAKFAST 90 tablet 0   Multiple Vitamin (MULTIVITAMIN) tablet Take 1 tablet by mouth daily.     rosuvastatin  (CRESTOR ) 5 MG tablet TAKE 1 TABLET BY MOUTH DAILY. 90 tablet 2   valsartan  (DIOVAN ) 320 MG tablet Take 1 tablet (320 mg total) by mouth daily. 90 tablet 0   Lancets Misc. MISC Use to check glucose up to twice daily. May substitute to any manufacturer covered by patient's insurance. 100 each 2   No current facility-administered medications on file prior to visit.   Past Medical History:  Diagnosis Date   Chronic diastolic heart failure (HCC) 11/18/2022   Dyslipidemia associated with type 2 diabetes mellitus (HCC) 09/09/2021   Encounter for hepatitis C screening test for low risk patient 08/17/2023   Encounter for immunization 08/17/2023   Essential hypertension 08/15/2020   Fibroids    Immunization due 08/17/2023   Morbid obesity with body mass index (BMI) of 45.0 to 49.9 in adult [facility]) 07/30/2020   Morbid obesity with body mass index (BMI) of 50.0 to 59.9 in adult [facility]) 07/30/2020   OSA (obstructive sleep apnea) 07/30/2020   Primary osteoarthritis of left knee 11/18/2022   Screening for HIV without presence of risk factors 08/17/2023   Urge incontinence 07/30/2020   Past Surgical History:  Procedure Laterality Date   conescopy     abnormal pap   LASIK     TOTAL ABDOMINAL HYSTERECTOMY     precancerous cervical cells and fibroids.   TUBAL LIGATION      Family History  Problem Relation Age of Onset   Diabetes Mother    Diabetes Father    Heart attack Father    Diabetes Sister    Breast cancer Neg Hx    Social History   Socioeconomic History   Marital status: Divorced    Spouse name: Not on file   Number of children: Not on file   Years of education: Not on file   Highest education level: Some college, no degree  Occupational History    Not on file  Tobacco Use   Smoking status: Former    Current packs/day: 0.00    Types: Cigarettes    Quit date: 1980    Years since quitting: 45.5   Smokeless tobacco: Never  Substance and Sexual Activity   Alcohol use: Yes    Alcohol/week: 1.0 standard drink of alcohol    Types: 1 Standard drinks or equivalent per week    Comment: per month   Drug use: Never   Sexual activity: Not on file  Other Topics Concern   Not on file  Social History Narrative   Not on file   Social Drivers of Health   Financial Resource Strain: Low Risk  (05/31/2024)   Overall Financial Resource Strain (CARDIA)    Difficulty of Paying Living Expenses: Not very hard  Food Insecurity: Food Insecurity Present (05/31/2024)   Hunger Vital Sign    Worried About Running Out of Food in the Last Year: Sometimes true    Ran Out of Food in the Last Year: Sometimes true  Transportation Needs: No Transportation Needs (05/31/2024)   PRAPARE - Administrator, Civil Service (Medical): No    Lack of Transportation (Non-Medical): No  Physical Activity: Inactive (05/31/2024)   Exercise Vital Sign    Days of Exercise per Week: 0 days    Minutes of Exercise per Session: Not on file  Stress: Stress Concern Present (05/31/2024)   [employer] of Occupational Health - Occupational Stress Questionnaire    Feeling of Stress: To some extent  Social Connections: Unknown (05/31/2024)   Social Connection and Isolation Panel    Frequency of Communication with Friends and Family: Not on file    Frequency of Social Gatherings with Friends and Family: Once a week    Attends Religious Services: Never    Database administrator or Organizations: Yes    Attends Engineer, structural: 1 to 4 times per year    Marital Status: Divorced    Objective:  BP 132/74   Pulse 76   Temp 98.1 F (36.7 C)   Ht 5' 5 (1.651 m)   Wt 299 lb (135.6 kg)   SpO2 97%   BMI 49.76 kg/m      06/01/2024    8:40 AM  02/24/2024    9:18 AM 12/17/2023    3:29 PM  BP/Weight  Systolic BP 132 120 140  Diastolic BP 74 78 80  Wt. (Lbs) 299 298   BMI 49.76 kg/m2 49.59 kg/m2     Physical Exam Vitals reviewed.  Constitutional:      Appearance: Normal appearance. She is obese.  Neck:     Vascular: No carotid bruit.   Cardiovascular:     Rate and Rhythm: Normal rate and regular rhythm.     Pulses: Normal pulses.     Heart sounds: Normal heart sounds.  Pulmonary:     Effort: Pulmonary effort is normal. No respiratory distress.     Breath sounds: Normal breath sounds.  Abdominal:     General: Abdomen is flat. Bowel sounds are normal.     Palpations: Abdomen is soft.     Tenderness: There is no abdominal tenderness.   Neurological:     Mental Status: She is alert and oriented to person, place, and time.   Psychiatric:        Mood and Affect: Mood normal.        Behavior: Behavior normal.      Diabetic foot exam was performed with the following findings:   Intact posterior tibialis and dorsalis pedis pulses Decreased sensation.  Calluses on the bottom of her feet and sides of great toe.   Onychomycosis.        Lab Results  Component Value Date   WBC 7.6 06/01/2024   HGB 11.5 06/01/2024   HCT 39.0 06/01/2024   PLT 310 06/01/2024   GLUCOSE 104 (H) 06/01/2024   CHOL 98 (L) 06/01/2024   TRIG 49 06/01/2024   HDL 39 (L) 06/01/2024  LDLCALC 47 06/01/2024   ALT 10 06/01/2024   AST 17 06/01/2024   NA 141 06/01/2024   K 4.3 06/01/2024   CL 106 06/01/2024   CREATININE 1.02 (H) 06/01/2024   BUN 14 06/01/2024   CO2 19 (L) 06/01/2024   TSH 1.590 04/23/2023   HGBA1C 6.3 (H) 06/01/2024   MICROALBUR 10 09/03/2021      Assessment & Plan:  Essential hypertension Assessment & Plan: The current medical regimen is effective;  continue present plan and medications. Continue  amlodipine  10 mg 1/2 daily and valsartan  320 mg once daily.  Check labs.   Orders: -     CBC with  Differential/Platelet -     Comprehensive metabolic panel with GFR  Dyslipidemia associated with type 2 diabetes mellitus (HCC) Assessment & Plan: Diabetes and hyperlipidemia well controlled.  Continue metformin  500 mg daily. Continue crestor  5 mg before bed.  - Perform daily foot checks and recommend annual eye exam.  Check labs.   Orders: -     Hemoglobin A1c -     Lipid panel  GAD (generalized anxiety disorder) Assessment & Plan: The current medical regimen is effective;  continue present plan and medications. Continue lexapro  20 mg daily. Management per specialist, psychiatry.   Vision disturbance Assessment & Plan: CALLED [facility] TO GET IN ASAP TO DETERMINE WHAT VISION DISTURBANCE IS.    Class 3 severe obesity with serious comorbidity and body mass index (BMI) of 45.0 to 49.9 in adult Assessment & Plan: Recommend continue to work on eating healthy diet and exercise.       No orders of the defined types were placed in this encounter.   Orders Placed This Encounter  Procedures   CBC with Differential/Platelet   Comprehensive metabolic panel with GFR   Hemoglobin A1c   Lipid panel     Follow-up: Return in about 3 months (around 09/01/2024) for chronic follow up.   I,Marla I Leal-Borjas,acting as a scribe for Abigail Free, MD.,have documented all relevant documentation on the behalf of Abigail Free, MD,as directed by  Abigail Free, MD while in the presence of Abigail Free, MD.   An After Visit Summary was printed and given to the patient. I attest that I have reviewed this visit and agree with the plan scribed by my staff.   Abigail Free, MD [facility] [phone]

## 2024-06-01 ENCOUNTER — Encounter: Payer: Self-pay | Admitting: Family Medicine

## 2024-06-01 ENCOUNTER — Ambulatory Visit: Admitting: Family Medicine

## 2024-06-01 VITALS — BP 132/74 | HR 76 | Temp 98.1°F | Ht 65.0 in | Wt 299.0 lb

## 2024-06-01 DIAGNOSIS — E785 Hyperlipidemia, unspecified: Secondary | ICD-10-CM | POA: Diagnosis not present

## 2024-06-01 DIAGNOSIS — E66813 Obesity, class 3: Secondary | ICD-10-CM

## 2024-06-01 DIAGNOSIS — Z6841 Body Mass Index (BMI) 40.0 and over, adult: Secondary | ICD-10-CM

## 2024-06-01 DIAGNOSIS — E1169 Type 2 diabetes mellitus with other specified complication: Secondary | ICD-10-CM

## 2024-06-01 DIAGNOSIS — I10 Essential (primary) hypertension: Secondary | ICD-10-CM

## 2024-06-01 DIAGNOSIS — F411 Generalized anxiety disorder: Secondary | ICD-10-CM

## 2024-06-01 DIAGNOSIS — H539 Unspecified visual disturbance: Secondary | ICD-10-CM | POA: Diagnosis not present

## 2024-06-01 HISTORY — DX: Body Mass Index (BMI) 40.0 and over, adult: Z684

## 2024-06-01 NOTE — Patient Instructions (Addendum)
 [email or web address]  Work on eating healthy diet and exercising.   Refer to eye doctor,

## 2024-06-02 ENCOUNTER — Ambulatory Visit: Payer: Self-pay | Admitting: Family Medicine

## 2024-06-02 DIAGNOSIS — F419 Anxiety disorder, unspecified: Secondary | ICD-10-CM | POA: Diagnosis not present

## 2024-06-02 LAB — COMPREHENSIVE METABOLIC PANEL WITH GFR
ALT: 10 IU/L (ref 0–32)
AST: 17 IU/L (ref 0–40)
Albumin: 4.2 g/dL (ref 3.9–4.9)
Alkaline Phosphatase: 68 IU/L (ref 44–121)
BUN/Creatinine Ratio: 14 (ref 12–28)
BUN: 14 mg/dL (ref 8–27)
Bilirubin Total: 0.3 mg/dL (ref 0.0–1.2)
CO2: 19 mmol/L — ABNORMAL LOW (ref 20–29)
Calcium: 9.1 mg/dL (ref 8.7–10.3)
Chloride: 106 mmol/L (ref 96–106)
Creatinine, Ser: 1.02 mg/dL — ABNORMAL HIGH (ref 0.57–1.00)
Globulin, Total: 2.5 g/dL (ref 1.5–4.5)
Glucose: 104 mg/dL — ABNORMAL HIGH (ref 70–99)
Potassium: 4.3 mmol/L (ref 3.5–5.2)
Sodium: 141 mmol/L (ref 134–144)
Total Protein: 6.7 g/dL (ref 6.0–8.5)
eGFR: 61 mL/min/{1.73_m2} (ref >59)

## 2024-06-02 LAB — CBC WITH DIFFERENTIAL/PLATELET
Basophils Absolute: 0.1 10*3/uL (ref 0.0–0.2)
Basos: 1 %
EOS (ABSOLUTE): 0.2 10*3/uL (ref 0.0–0.4)
Eos: 3 %
Hematocrit: 39 % (ref 34.0–46.6)
Hemoglobin: 11.5 g/dL (ref 11.1–15.9)
Immature Grans (Abs): 0 10*3/uL (ref 0.0–0.1)
Immature Granulocytes: 0 %
Lymphocytes Absolute: 1.8 10*3/uL (ref 0.7–3.1)
Lymphs: 24 %
MCH: 23.2 pg — ABNORMAL LOW (ref 26.6–33.0)
MCHC: 29.5 g/dL — ABNORMAL LOW (ref 31.5–35.7)
MCV: 79 fL (ref 79–97)
Monocytes Absolute: 0.8 10*3/uL (ref 0.1–0.9)
Monocytes: 10 %
Neutrophils Absolute: 4.7 10*3/uL (ref 1.4–7.0)
Neutrophils: 62 %
Platelets: 310 10*3/uL (ref 150–450)
RBC: 4.96 x10E6/uL (ref 3.77–5.28)
RDW: 13.6 % (ref 11.7–15.4)
WBC: 7.6 10*3/uL (ref 3.4–10.8)

## 2024-06-02 LAB — LIPID PANEL
Chol/HDL Ratio: 2.5 ratio (ref 0.0–4.4)
Cholesterol, Total: 98 mg/dL — ABNORMAL LOW (ref 100–199)
HDL: 39 mg/dL — ABNORMAL LOW (ref >39)
LDL Chol Calc (NIH): 47 mg/dL (ref 0–99)
Triglycerides: 49 mg/dL (ref 0–149)
VLDL Cholesterol Cal: 12 mg/dL (ref 5–40)

## 2024-06-02 LAB — HEMOGLOBIN A1C
Est. average glucose Bld gHb Est-mCnc: 134 mg/dL
Hgb A1c MFr Bld: 6.3 % — ABNORMAL HIGH (ref 4.8–5.6)

## 2024-06-06 DIAGNOSIS — H539 Unspecified visual disturbance: Secondary | ICD-10-CM

## 2024-06-06 HISTORY — DX: Unspecified visual disturbance: H53.9

## 2024-06-06 NOTE — Assessment & Plan Note (Signed)
 CALLED [facility] TO GET IN ASAP TO DETERMINE WHAT VISION DISTURBANCE IS.

## 2024-06-06 NOTE — Assessment & Plan Note (Signed)
 The current medical regimen is effective;  continue present plan and medications. Continue  amlodipine  10 mg 1/2 daily and valsartan  320 mg once daily.  Check labs.

## 2024-06-06 NOTE — Assessment & Plan Note (Signed)
 Recommend continue to work on eating healthy diet and exercise.

## 2024-06-06 NOTE — Assessment & Plan Note (Signed)
 The current medical regimen is effective;  continue present plan and medications. Continue lexapro  20 mg daily. Management per specialist, psychiatry.

## 2024-06-06 NOTE — Assessment & Plan Note (Signed)
 Diabetes and hyperlipidemia well controlled.  Continue metformin  500 mg daily. Continue crestor  5 mg before bed.  - Perform daily foot checks and recommend annual eye exam.  Check labs.

## 2024-06-08 ENCOUNTER — Other Ambulatory Visit: Payer: Self-pay | Admitting: Family Medicine

## 2024-06-08 DIAGNOSIS — R718 Other abnormality of red blood cells: Secondary | ICD-10-CM

## 2024-06-08 NOTE — Telephone Encounter (Signed)
 Patient notified. Patient has been scheduled for tomorrow morning for lab work.

## 2024-06-09 ENCOUNTER — Other Ambulatory Visit

## 2024-06-09 DIAGNOSIS — R718 Other abnormality of red blood cells: Secondary | ICD-10-CM

## 2024-06-13 LAB — HGB FRACTIONATION CASCADE
Hgb A2: 2.4 % (ref 1.8–3.2)
Hgb A: 97.6 % (ref 96.4–98.8)
Hgb F: 0 % (ref 0.0–2.0)
Hgb S: 0 %

## 2024-06-13 LAB — IRON,TIBC AND FERRITIN PANEL
Ferritin: 12 ng/mL — ABNORMAL LOW (ref 15–150)
Iron Saturation: 11 % — ABNORMAL LOW (ref 15–55)
Iron: 40 ug/dL (ref 27–139)
Total Iron Binding Capacity: 362 ug/dL (ref 250–450)
UIBC: 322 ug/dL (ref 118–369)

## 2024-06-16 DIAGNOSIS — F419 Anxiety disorder, unspecified: Secondary | ICD-10-CM | POA: Diagnosis not present

## 2024-06-20 ENCOUNTER — Ambulatory Visit: Payer: Self-pay | Admitting: Family Medicine

## 2024-07-26 ENCOUNTER — Other Ambulatory Visit: Payer: Self-pay | Admitting: Family Medicine

## 2024-07-27 LAB — HM DIABETES EYE EXAM

## 2024-08-16 ENCOUNTER — Other Ambulatory Visit: Payer: Self-pay | Admitting: Family Medicine

## 2024-08-16 DIAGNOSIS — E119 Type 2 diabetes mellitus without complications: Secondary | ICD-10-CM

## 2024-08-24 DIAGNOSIS — F419 Anxiety disorder, unspecified: Secondary | ICD-10-CM | POA: Diagnosis not present

## 2024-09-02 ENCOUNTER — Other Ambulatory Visit: Payer: Self-pay | Admitting: Medical Genetics

## 2024-09-03 ENCOUNTER — Other Ambulatory Visit

## 2024-09-03 DIAGNOSIS — I10 Essential (primary) hypertension: Secondary | ICD-10-CM | POA: Diagnosis not present

## 2024-09-03 DIAGNOSIS — E785 Hyperlipidemia, unspecified: Secondary | ICD-10-CM | POA: Diagnosis not present

## 2024-09-03 DIAGNOSIS — E1169 Type 2 diabetes mellitus with other specified complication: Secondary | ICD-10-CM | POA: Diagnosis not present

## 2024-09-03 DIAGNOSIS — E119 Type 2 diabetes mellitus without complications: Secondary | ICD-10-CM | POA: Diagnosis not present

## 2024-09-03 DIAGNOSIS — I5032 Chronic diastolic (congestive) heart failure: Secondary | ICD-10-CM | POA: Diagnosis not present

## 2024-09-04 LAB — CBC WITH DIFFERENTIAL/PLATELET
Basophils Absolute: 0.1 x10E3/uL (ref 0.0–0.2)
Basos: 1 %
EOS (ABSOLUTE): 0.2 x10E3/uL (ref 0.0–0.4)
Eos: 2 %
Hematocrit: 40.6 % (ref 34.0–46.6)
Hemoglobin: 11.9 g/dL (ref 11.1–15.9)
Immature Grans (Abs): 0 x10E3/uL (ref 0.0–0.1)
Immature Granulocytes: 0 %
Lymphocytes Absolute: 1.9 x10E3/uL (ref 0.7–3.1)
Lymphs: 25 %
MCH: 23.7 pg — ABNORMAL LOW (ref 26.6–33.0)
MCHC: 29.3 g/dL — ABNORMAL LOW (ref 31.5–35.7)
MCV: 81 fL (ref 79–97)
Monocytes Absolute: 0.7 x10E3/uL (ref 0.1–0.9)
Monocytes: 9 %
Neutrophils Absolute: 4.9 x10E3/uL (ref 1.4–7.0)
Neutrophils: 63 %
Platelets: 280 x10E3/uL (ref 150–450)
RBC: 5.03 x10E6/uL (ref 3.77–5.28)
RDW: 14.9 % (ref 11.7–15.4)
WBC: 7.7 x10E3/uL (ref 3.4–10.8)

## 2024-09-04 LAB — HEMOGLOBIN A1C
Est. average glucose Bld gHb Est-mCnc: 131 mg/dL
Hgb A1c MFr Bld: 6.2 % — ABNORMAL HIGH (ref 4.8–5.6)

## 2024-09-04 LAB — COMPREHENSIVE METABOLIC PANEL WITH GFR
ALT: 12 IU/L (ref 0–32)
AST: 17 IU/L (ref 0–40)
Albumin: 4.2 g/dL (ref 3.9–4.9)
Alkaline Phosphatase: 58 IU/L (ref 49–135)
BUN/Creatinine Ratio: 22 (ref 12–28)
BUN: 20 mg/dL (ref 8–27)
Bilirubin Total: 0.3 mg/dL (ref 0.0–1.2)
CO2: 21 mmol/L (ref 20–29)
Calcium: 9.1 mg/dL (ref 8.7–10.3)
Chloride: 106 mmol/L (ref 96–106)
Creatinine, Ser: 0.91 mg/dL (ref 0.57–1.00)
Globulin, Total: 2.4 g/dL (ref 1.5–4.5)
Glucose: 107 mg/dL — ABNORMAL HIGH (ref 70–99)
Potassium: 4.4 mmol/L (ref 3.5–5.2)
Sodium: 141 mmol/L (ref 134–144)
Total Protein: 6.6 g/dL (ref 6.0–8.5)
eGFR: 70 mL/min/1.73 (ref >59)

## 2024-09-04 LAB — IRON,TIBC AND FERRITIN PANEL
Ferritin: 28 ng/mL (ref 15–150)
Iron Saturation: 15 % (ref 15–55)
Iron: 49 ug/dL (ref 27–139)
Total Iron Binding Capacity: 331 ug/dL (ref 250–450)
UIBC: 282 ug/dL (ref 118–369)

## 2024-09-04 LAB — LIPID PANEL
Chol/HDL Ratio: 2.9 ratio (ref 0.0–4.4)
Cholesterol, Total: 103 mg/dL (ref 100–199)
HDL: 36 mg/dL — ABNORMAL LOW (ref >39)
LDL Chol Calc (NIH): 52 mg/dL (ref 0–99)
Triglycerides: 70 mg/dL (ref 0–149)
VLDL Cholesterol Cal: 15 mg/dL (ref 5–40)

## 2024-09-05 ENCOUNTER — Ambulatory Visit: Payer: Self-pay | Admitting: Family Medicine

## 2024-09-05 NOTE — Assessment & Plan Note (Signed)
 [name]

## 2024-09-05 NOTE — Progress Notes (Unsigned)
 Subjective:  Patient ID: Virginia Barron, female    DOB: 01-15-1961  Age: 63 y.o. MRN: [id number]  No chief complaint on file.   HPI: Discussed the use of AI scribe software for clinical note transcription with the patient, who gave verbal consent to proceed.  History of Present Illness        06/01/2024    8:46 AM 11/25/2023    9:12 AM 08/13/2023    9:11 AM 04/23/2023    7:44 AM 11/21/2022   10:07 AM  Depression screen PHQ 2/9  Decreased Interest 1 2 0 1 1  Down, Depressed, Hopeless 0 2 0 1 1  PHQ - 2 Score 1 4 0 2 2  Altered sleeping 1 1 0 0 2  Tired, decreased energy 1 3 0 3 1  Change in appetite 0 0 0 1 0  Feeling bad or failure about yourself  0 0 0 0 0  Trouble concentrating 1 0 0 2 1  Moving slowly or fidgety/restless 0 0 0 0 0  Suicidal thoughts 0 2 0 0 0  PHQ-9 Score 4 10 0 8 6  Difficult doing work/chores Not difficult at all Somewhat difficult Not difficult at all Not difficult at all Somewhat difficult        06/01/2024    8:46 AM  Fall Risk   Falls in the past year? 0  Number falls in past yr: 0  Injury with Fall? 0  Risk for fall due to : No Fall Risks  Follow up Falls evaluation completed    Patient Care Team: Sherre Clapper, MD as PCP - General (Family Medicine) Tobb, Kardie, DO as PCP - Cardiology (Cardiology) Beverli Cara PARAS, [facility] (Inactive) (Pharmacist)   Review of Systems  Current Outpatient Medications on File Prior to Visit  Medication Sig Dispense Refill   ACCU-CHEK GUIDE TEST test strip USE TO CHECK GLUCOSE UP TO TWICE DAILY. 100 each 2   Accu-Chek Softclix Lancets lancets USE TO CHECK BLOOD SUGAR THREE TIMES DAILY 100 each 2   acetaminophen (TYLENOL) 500 MG tablet Take 500 mg by mouth every 6 (six) hours as needed for moderate pain (pain score 4-6) or mild pain (pain score 1-3).     amLODipine  (NORVASC ) 10 MG tablet Take 0.5 tablets (5 mg total) by mouth daily. 45 tablet 3   Blood Glucose Monitoring Suppl DEVI Use to check glucose up to  twice daily. May substitute to any manufacturer covered by patient's insurance. 1 each 0   cholecalciferol (VITAMIN D3) 25 MCG (1000 UNIT) tablet Take 1 tablet (1,000 Units total) by mouth daily. 30 tablet 1   escitalopram  (LEXAPRO ) 20 MG tablet TAKE 1 TABLET BY MOUTH DAILY. 90 tablet 0   Lancet Device MISC Use to check glucose up to twice daily. May substitute to any manufacturer covered by patient's insurance. 1 each 2   Lancets Misc. MISC Use to check glucose up to twice daily. May substitute to any manufacturer covered by patient's insurance. 100 each 2   metFORMIN  (GLUCOPHAGE ) 500 MG tablet TAKE 1 TABLET BY MOUTH EVERY DAY WITH BREAKFAST 90 tablet 0   Multiple Vitamin (MULTIVITAMIN) tablet Take 1 tablet by mouth daily.     rosuvastatin  (CRESTOR ) 5 MG tablet TAKE 1 TABLET BY MOUTH DAILY. 90 tablet 2   valsartan  (DIOVAN ) 320 MG tablet TAKE 1 TABLET BY MOUTH DAILY. 90 tablet 0   No current facility-administered medications on file prior to visit.   Past Medical History:  Diagnosis Date  Chronic diastolic heart failure (HCC) 11/18/2022   Dyslipidemia associated with type 2 diabetes mellitus (HCC) 09/09/2021   Encounter for hepatitis C screening test for low risk patient 08/17/2023   Encounter for immunization 08/17/2023   Essential hypertension 08/15/2020   Fibroids    Immunization due 08/17/2023   Morbid obesity with body mass index (BMI) of 45.0 to 49.9 in adult [facility]) 07/30/2020   Morbid obesity with body mass index (BMI) of 50.0 to 59.9 in adult (HCC) 07/30/2020   OSA (obstructive sleep apnea) 07/30/2020   Primary osteoarthritis of left knee 11/18/2022   Screening for HIV without presence of risk factors 08/17/2023   Urge incontinence 07/30/2020   Past Surgical History:  Procedure Laterality Date   conescopy     abnormal pap   LASIK     TOTAL ABDOMINAL HYSTERECTOMY     precancerous cervical cells and fibroids.   TUBAL LIGATION      Family History  Problem Relation Age of  Onset   Diabetes Mother    Diabetes Father    Heart attack Father    Diabetes Sister    Breast cancer Neg Hx    Social History   Socioeconomic History   Marital status: Divorced    Spouse name: Not on file   Number of children: Not on file   Years of education: Not on file   Highest education level: Some college, no degree  Occupational History   Not on file  Tobacco Use   Smoking status: Former    Current packs/day: 0.00    Types: Cigarettes    Quit date: 1980    Years since quitting: 45.7   Smokeless tobacco: Never  Substance and Sexual Activity   Alcohol use: Yes    Alcohol/week: 1.0 standard drink of alcohol    Types: 1 Standard drinks or equivalent per week    Comment: per month   Drug use: Never   Sexual activity: Not on file  Other Topics Concern   Not on file  Social History Narrative   Not on file   Social Drivers of Health   Financial Resource Strain: Low Risk  (05/31/2024)   Overall Financial Resource Strain (CARDIA)    Difficulty of Paying Living Expenses: Not very hard  Food Insecurity: Food Insecurity Present (05/31/2024)   Hunger Vital Sign    Worried About Running Out of Food in the Last Year: Sometimes true    Ran Out of Food in the Last Year: Sometimes true  Transportation Needs: No Transportation Needs (05/31/2024)   PRAPARE - Administrator, Civil Service (Medical): No    Lack of Transportation (Non-Medical): No  Physical Activity: Inactive (05/31/2024)   Exercise Vital Sign    Days of Exercise per Week: 0 days    Minutes of Exercise per Session: Not on file  Stress: Stress Concern Present (05/31/2024)   [employer] of Occupational Health - Occupational Stress Questionnaire    Feeling of Stress: To some extent  Social Connections: Unknown (05/31/2024)   Social Connection and Isolation Panel    Frequency of Communication with Friends and Family: Not on file    Frequency of Social Gatherings with Friends and Family: Once a week     Attends Religious Services: Never    Database administrator or Organizations: Yes    Attends Banker Meetings: 1 to 4 times per year    Marital Status: Divorced    Objective:  There were no vitals taken  for this visit.     06/01/2024    8:40 AM 02/24/2024    9:18 AM 12/17/2023    3:29 PM  BP/Weight  Systolic BP 132 120 140  Diastolic BP 74 78 80  Wt. (Lbs) 299 298   BMI 49.76 kg/m2 49.59 kg/m2     Physical Exam  {Perform Simple Foot Exam  Perform Detailed exam:1} {Insert foot Exam (Optional):30965}   Lab Results  Component Value Date   WBC 7.7 09/03/2024   HGB 11.9 09/03/2024   HCT 40.6 09/03/2024   PLT 280 09/03/2024   GLUCOSE 107 (H) 09/03/2024   CHOL 103 09/03/2024   TRIG 70 09/03/2024   HDL 36 (L) 09/03/2024   LDLCALC 52 09/03/2024   ALT 12 09/03/2024   AST 17 09/03/2024   NA 141 09/03/2024   K 4.4 09/03/2024   CL 106 09/03/2024   CREATININE 0.91 09/03/2024   BUN 20 09/03/2024   CO2 21 09/03/2024   TSH 1.590 04/23/2023   HGBA1C 6.2 (H) 09/03/2024    Results for orders placed or performed in visit on 09/03/24  CBC with Differential/Platelet   Collection Time: 09/03/24  8:51 AM  Result Value Ref Range   WBC 7.7 3.4 - 10.8 x10E3/uL   RBC 5.03 3.77 - 5.28 x10E6/uL   Hemoglobin 11.9 11.1 - 15.9 g/dL   Hematocrit 59.3 65.9 - 46.6 %   MCV 81 79 - 97 fL   MCH 23.7 (L) 26.6 - 33.0 pg   MCHC 29.3 (L) 31.5 - 35.7 g/dL   RDW 85.0 88.2 - 84.5 %   Platelets 280 150 - 450 x10E3/uL   Neutrophils 63 Not Estab. %   Lymphs 25 Not Estab. %   Monocytes 9 Not Estab. %   Eos 2 Not Estab. %   Basos 1 Not Estab. %   Neutrophils Absolute 4.9 1.4 - 7.0 x10E3/uL   Lymphocytes Absolute 1.9 0.7 - 3.1 x10E3/uL   Monocytes Absolute 0.7 0.1 - 0.9 x10E3/uL   EOS (ABSOLUTE) 0.2 0.0 - 0.4 x10E3/uL   Basophils Absolute 0.1 0.0 - 0.2 x10E3/uL   Immature Granulocytes 0 Not Estab. %   Immature Grans (Abs) 0.0 0.0 - 0.1 x10E3/uL  Comprehensive metabolic panel with  GFR   Collection Time: 09/03/24  8:51 AM  Result Value Ref Range   Glucose 107 (H) 70 - 99 mg/dL   BUN 20 8 - 27 mg/dL   Creatinine, Ser 9.08 0.57 - 1.00 mg/dL   eGFR 70 >40 fO/fpw/8.26   BUN/Creatinine Ratio 22 12 - 28   Sodium 141 134 - 144 mmol/L   Potassium 4.4 3.5 - 5.2 mmol/L   Chloride 106 96 - 106 mmol/L   CO2 21 20 - 29 mmol/L   Calcium  9.1 8.7 - 10.3 mg/dL   Total Protein 6.6 6.0 - 8.5 g/dL   Albumin 4.2 3.9 - 4.9 g/dL   Globulin, Total 2.4 1.5 - 4.5 g/dL   Bilirubin Total 0.3 0.0 - 1.2 mg/dL   Alkaline Phosphatase 58 49 - 135 IU/L   AST 17 0 - 40 IU/L   ALT 12 0 - 32 IU/L  Hemoglobin A1c   Collection Time: 09/03/24  8:51 AM  Result Value Ref Range   Hgb A1c MFr Bld 6.2 (H) 4.8 - 5.6 %   Est. average glucose Bld gHb Est-mCnc 131 mg/dL  Lipid panel   Collection Time: 09/03/24  8:51 AM  Result Value Ref Range   Cholesterol, Total 103 100 - 199  mg/dL   Triglycerides 70 0 - 149 mg/dL   HDL 36 (L) >60 mg/dL   VLDL Cholesterol Cal 15 5 - 40 mg/dL   LDL Chol Calc (NIH) 52 0 - 99 mg/dL   Chol/HDL Ratio 2.9 0.0 - 4.4 ratio  Iron, TIBC and Ferritin Panel   Collection Time: 09/03/24  8:51 AM  Result Value Ref Range   Total Iron Binding Capacity 331 250 - 450 ug/dL   UIBC 717 881 - 630 ug/dL   Iron 49 27 - 860 ug/dL   Iron Saturation 15 15 - 55 %   Ferritin 28 15 - 150 ng/mL  .  Assessment & Plan:   Assessment & Plan Essential hypertension     Dyslipidemia associated with type 2 diabetes mellitus (HCC)     Chronic diastolic heart failure (HCC)     Class 3 severe obesity with serious comorbidity and body mass index (BMI) of 45.0 to 49.9 in adult     GAD (generalized anxiety disorder)       There is no height or weight on file to calculate BMI.  Assessment and Plan Assessment & Plan      No orders of the defined types were placed in this encounter.   No orders of the defined types were placed in this encounter.      Follow-up: No  follow-ups on file.  An After Visit Summary was printed and given to the patient.  Abigail Free, MD [facility] [phone]

## 2024-09-05 NOTE — Assessment & Plan Note (Signed)
 Virginia Barron

## 2024-09-07 ENCOUNTER — Encounter: Payer: Self-pay | Admitting: Family Medicine

## 2024-09-07 ENCOUNTER — Ambulatory Visit: Admitting: Family Medicine

## 2024-09-07 VITALS — BP 132/82 | HR 75 | Temp 97.6°F | Ht 65.0 in | Wt 298.0 lb

## 2024-09-07 DIAGNOSIS — F411 Generalized anxiety disorder: Secondary | ICD-10-CM | POA: Diagnosis not present

## 2024-09-07 DIAGNOSIS — E66813 Obesity, class 3: Secondary | ICD-10-CM

## 2024-09-07 DIAGNOSIS — R42 Dizziness and giddiness: Secondary | ICD-10-CM | POA: Insufficient documentation

## 2024-09-07 DIAGNOSIS — I5032 Chronic diastolic (congestive) heart failure: Secondary | ICD-10-CM

## 2024-09-07 DIAGNOSIS — E785 Hyperlipidemia, unspecified: Secondary | ICD-10-CM

## 2024-09-07 DIAGNOSIS — E1169 Type 2 diabetes mellitus with other specified complication: Secondary | ICD-10-CM | POA: Diagnosis not present

## 2024-09-07 DIAGNOSIS — Z6841 Body Mass Index (BMI) 40.0 and over, adult: Secondary | ICD-10-CM | POA: Diagnosis not present

## 2024-09-07 DIAGNOSIS — I10 Essential (primary) hypertension: Secondary | ICD-10-CM | POA: Diagnosis not present

## 2024-09-07 HISTORY — DX: Dizziness and giddiness: R42

## 2024-09-07 MED ORDER — TIRZEPATIDE 2.5 MG/0.5ML ~~LOC~~ SOAJ: 2.5000 mg | SUBCUTANEOUS | 0 refills | Status: DC

## 2024-09-07 MED ORDER — VALSARTAN 320 MG PO TABS: 320.0000 mg | ORAL_TABLET | Freq: Every day | ORAL | 1 refills | Status: AC

## 2024-09-07 MED ORDER — ROSUVASTATIN CALCIUM 5 MG PO TABS: 5.0000 mg | ORAL_TABLET | Freq: Every day | ORAL | 1 refills | Status: AC

## 2024-09-07 MED ORDER — MECLIZINE HCL 25 MG PO TABS: 25.0000 mg | ORAL_TABLET | Freq: Three times a day (TID) | ORAL | 1 refills | Status: AC | PRN

## 2024-09-07 MED ORDER — METFORMIN HCL 500 MG PO TABS: 500.0000 mg | ORAL_TABLET | Freq: Every day | ORAL | 0 refills | Status: AC

## 2024-09-07 MED ORDER — AMLODIPINE BESYLATE 5 MG PO TABS: 5.0000 mg | ORAL_TABLET | Freq: Every day | ORAL | 1 refills | Status: AC

## 2024-09-07 NOTE — Assessment & Plan Note (Addendum)
 Dizziness for two weeks, daily, worsens postprandially. Possible inner ear infection considered, no definitive cause identified. - Prescribe meclizine, up to three times daily, start with one in the morning. - Reassess dizziness after one week. Orders:   meclizine (ANTIVERT) 25 MG tablet; Take 1 tablet (25 mg total) by mouth 3 (three) times daily as needed for dizziness.

## 2024-09-07 NOTE — Patient Instructions (Addendum)
  VISIT SUMMARY: Today, we addressed your dizziness, weight management, diabetes, blood pressure, and anxiety. We also discussed general health maintenance, including exercise and healthy eating.  YOUR PLAN: DIZZINESS: You have been experiencing dizziness for the past two weeks, which sometimes worsens after eating. -Start taking meclizine up to three times daily as needed, beginning with one in the morning.  OBESITY, CLASS 3: You have class 3 obesity and have recently lost two pounds. We discussed starting Mounjaro for significant weight reduction. -Start Mounjaro for weight loss. Samples have been provided. -Make dietary changes to include more fruits, vegetables, and proteins. -Avoid overeating to prevent nausea.  TYPE 2 DIABETES MELLITUS: Your diabetes is well-controlled with an A1c of 6.2% and blood sugar levels between 97 and 127 mg/dL. -Continue taking metformin  500 mg once daily. -Start mounjaro 2.5 mg weekly for improved weight loss  -Monitor your blood sugar regularly.  ESSENTIAL HYPERTENSION: Your blood pressure is well-controlled with your current medications. -Continue taking valsartan  320 mg daily. -Start taking amlodipine  5 mg daily.  GENERALIZED ANXIETY DISORDER: Your anxiety is managed with Lexapro  10 mg daily, and you feel better on this lower dose. -Continue taking Lexapro  10 mg daily.  GENERAL HEALTH MAINTENANCE: We discussed the importance of exercise and healthy eating to improve your HDL cholesterol. -Engage in regular exercise. -Incorporate healthy foods into your diet, such as nuts and non-fried fish.  FOOT CALLUSES: SOAK FEET DAILY AND USE VASELINE GENEROUSLY.                       Contains text generated by Abridge.                                 Contains text generated by Abridge.

## 2024-10-05 ENCOUNTER — Encounter: Payer: Self-pay | Admitting: Family Medicine

## 2024-10-05 ENCOUNTER — Ambulatory Visit: Admitting: Family Medicine

## 2024-10-05 VITALS — BP 120/78 | HR 80 | Temp 98.3°F | Resp 18 | Ht 65.0 in | Wt 299.2 lb

## 2024-10-05 DIAGNOSIS — E1169 Type 2 diabetes mellitus with other specified complication: Secondary | ICD-10-CM | POA: Diagnosis not present

## 2024-10-05 DIAGNOSIS — Z23 Encounter for immunization: Secondary | ICD-10-CM

## 2024-10-05 DIAGNOSIS — R42 Dizziness and giddiness: Secondary | ICD-10-CM | POA: Diagnosis not present

## 2024-10-05 DIAGNOSIS — E785 Hyperlipidemia, unspecified: Secondary | ICD-10-CM | POA: Diagnosis not present

## 2024-10-05 DIAGNOSIS — L989 Disorder of the skin and subcutaneous tissue, unspecified: Secondary | ICD-10-CM | POA: Insufficient documentation

## 2024-10-05 HISTORY — DX: Disorder of the skin and subcutaneous tissue, unspecified: L98.9

## 2024-10-05 MED ORDER — OZEMPIC (0.25 OR 0.5 MG/DOSE) 2 MG/3ML ~~LOC~~ SOPN: 0.2500 mg | PEN_INJECTOR | SUBCUTANEOUS | 0 refills | Status: DC

## 2024-10-05 NOTE — Patient Instructions (Signed)
  VISIT SUMMARY: Today, you were seen for dizziness, medication management for your prediabetes and hypertension, and a new skin lesion on your leg. We discussed your symptoms and reviewed your current medications. We also talked about a new prescription for your diabetes management and evaluated the new skin lesion.  YOUR PLAN: -TYPE 2 DIABETES MELLITUS: Type 2 diabetes is a condition where your body does not use insulin properly, leading to high blood sugar levels. Your A1c is 6.5, and we discussed starting a new medication called Ozempic, which is covered by your insurance. Ozempic is an injectable medication that helps control blood sugar levels. We reviewed how to use it and discussed potential side effects. Please start using Ozempic as prescribed and follow up in six weeks to see how you are responding to the medication. If you experience any side effects like nausea, contact our office.  -DIZZINESS: You have been experiencing daily episodes of dizziness, especially around 2 PM or after eating lunch. We recommend that you carry meclizine with you to use if you feel dizzy.  -HYPERTENSION: Hypertension, or high blood pressure, is well-controlled with your current medication regimen. No changes are needed at this time.  -SKIN LESION, POSSIBLE MOLE: You have a new skin lesion on your leg that could be a mole. We will consult with Dr. Sherre for further evaluation and management of this lesion.  INSTRUCTIONS: Please start using Ozempic as prescribed and follow up in six weeks to assess your response to the medication. If you experience any side effects like nausea, contact our office. Additionally, we will consult with Dr. Sherre regarding the new skin lesion on your leg for further evaluation and management.                      Contains text generated by Abridge.                                 Contains text generated by Abridge.

## 2024-10-05 NOTE — Assessment & Plan Note (Signed)
 Dizziness Intermittent dizziness that resolves quickly. Differentials include low blood sugar, dehydration or BPPH. - Consider carrying meclizine for symptomatic relief if dizziness occurs. - Consider referral to ENT if symptoms get worse or prescribed medication is not working.

## 2024-10-05 NOTE — Assessment & Plan Note (Addendum)
 Type 2 diabetes well controlled. Mounjaro not approved; Ozempic covered by insurance. Explained GLP-1 receptor agonist mechanism, side effects, and medication trial requirements. Educated on injection technique. Lab Results  Component Value Date   HGBA1C 6.2 (H) 09/03/2024  - Prescribe Ozempic and send prescription to pharmacy. - Schedule follow-up in six weeks to assess response to Ozempic. - Discuss potential side effects and management, including contacting the office if nausea or other side effects occur.  Dyslipidemia Well controlled Last lipids Lab Results  Component Value Date   CHOL 103 09/03/2024   HDL 36 (L) 09/03/2024   LDLCALC 52 09/03/2024   TRIG 70 09/03/2024   CHOLHDL 2.9 09/03/2024  - Continue Crestor  5 mg once daily - Recommend continue working on diet and exercise  Orders:   Semaglutide,0.25 or 0.5MG /DOS, (OZEMPIC, 0.25 OR 0.5 MG/DOSE,) 2 MG/3ML SOPN; Inject 0.25 mg into the skin once a week.

## 2024-10-05 NOTE — Assessment & Plan Note (Addendum)
 Need for Covid-19 vaccine, requested Orders:   Pfizer Comirnaty Covid-19 Vaccine 65yrs & older

## 2024-10-05 NOTE — Assessment & Plan Note (Addendum)
 Skin lesion, possible mole New skin lesion on right leg, possibly a mole.  - Refer to dermatology for evaluation and management.  Orders:   Ambulatory referral to Dermatology

## 2024-10-05 NOTE — Progress Notes (Signed)
 Subjective:  Patient ID: Virginia Barron, female    DOB: 10/26/1961  Age: 63 y.o. MRN: [id number]  Chief Complaint  Patient presents with   Medical Management of Chronic Issues   Discussed the use of AI scribe software for clinical note transcription with the patient, who gave verbal consent to proceed.  History of Present Illness   Virginia Barron is a 63 year old female with diabetes and hypertension who presents with dizziness and medication management.  Dizziness and lightheadedness - Daily episodes of dizziness, described as lightheadedness, consistently occurring around 2 PM or after eating lunch - Dizziness does not last long enough to warrant taking meclizine, which she has been prescribed but does not carry with her - No associated sinus issues, ear fullness, decreased hearing, or signs of infection - No blood sugar checks during episodes, as she is usually out and about  Diabetes - Does not check her blood sugars - Checks feet daily - Currently on metformin  - No prior use of Byetta, Trulicity, Victoza, or Ozempic - Insurance did not approve Mounjaro, letter indicates that she needs to fail two of the above medications.  Cutaneous lesion of the leg - Sudden appearance of a bump on the leg, uncertain if it is a scab or a mole - Unable to visualize the lesion well - No recent trauma to the affected area - Requests evaluation of the lesion       09/07/2024    2:02 PM 06/01/2024    8:46 AM 11/25/2023    9:12 AM 08/13/2023    9:11 AM 04/23/2023    7:44 AM  Depression screen PHQ 2/9  Decreased Interest 1 1 2  0 1  Down, Depressed, Hopeless 0 0 2 0 1  PHQ - 2 Score 1 1 4  0 2  Altered sleeping 0 1 1 0 0  Tired, decreased energy 3 1 3  0 3  Change in appetite 0 0 0 0 1  Feeling bad or failure about yourself  0 0 0 0 0  Trouble concentrating 1 1 0 0 2  Moving slowly or fidgety/restless 0 0 0 0 0  Suicidal thoughts 0 0 2 0 0  PHQ-9 Score 5 4 10  0 8  Difficult  doing work/chores Not difficult at all Not difficult at all Somewhat difficult Not difficult at all Not difficult at all        09/07/2024    2:02 PM  Fall Risk   Falls in the past year? 0  Number falls in past yr: 0  Injury with Fall? 0  Risk for fall due to : No Fall Risks  Follow up Falls evaluation completed    Patient Care Team: Sherre Clapper, MD as PCP - General (Family Medicine) Tobb, Kardie, DO as PCP - Cardiology (Cardiology) Beverli Cara PARAS, [facility] (Inactive) (Pharmacist)   Review of Systems  Constitutional:  Negative for chills, diaphoresis, fatigue and fever.  HENT:  Negative for congestion, ear pain, sinus pressure and sinus pain.   Eyes: Negative.   Respiratory:  Negative for cough and shortness of breath.   Cardiovascular:  Negative for chest pain.  Gastrointestinal:  Negative for abdominal pain, constipation, diarrhea, nausea and vomiting.  Endocrine: Negative.   Genitourinary:  Negative for dysuria, frequency and urgency.  Musculoskeletal:  Negative for arthralgias.  Skin: Negative.   Allergic/Immunologic: Negative.   Neurological:  Positive for dizziness. Negative for weakness, light-headedness and headaches.  Hematological: Negative.   Psychiatric/Behavioral:  Negative for dysphoric mood.  The patient is not nervous/anxious.     Current Outpatient Medications on File Prior to Visit  Medication Sig Dispense Refill   ACCU-CHEK GUIDE TEST test strip USE TO CHECK GLUCOSE UP TO TWICE DAILY. 100 each 2   Accu-Chek Softclix Lancets lancets USE TO CHECK BLOOD SUGAR THREE TIMES DAILY 100 each 2   acetaminophen (TYLENOL) 500 MG tablet Take 500 mg by mouth every 6 (six) hours as needed for moderate pain (pain score 4-6) or mild pain (pain score 1-3).     amLODipine  (NORVASC ) 5 MG tablet Take 1 tablet (5 mg total) by mouth daily. 90 tablet 1   Blood Glucose Monitoring Suppl DEVI Use to check glucose up to twice daily. May substitute to any manufacturer covered by patient's  insurance. 1 each 0   escitalopram  (LEXAPRO ) 10 MG tablet Take 10 mg by mouth daily.     Lancet Device MISC Use to check glucose up to twice daily. May substitute to any manufacturer covered by patient's insurance. 1 each 2   Lancets Misc. MISC Use to check glucose up to twice daily. May substitute to any manufacturer covered by patient's insurance. 100 each 2   meclizine (ANTIVERT) 25 MG tablet Take 1 tablet (25 mg total) by mouth 3 (three) times daily as needed for dizziness. 30 tablet 1   metFORMIN  (GLUCOPHAGE ) 500 MG tablet Take 1 tablet (500 mg total) by mouth daily with breakfast. 90 tablet 0   Multiple Vitamin (MULTIVITAMIN) tablet Take 1 tablet by mouth daily.     rosuvastatin  (CRESTOR ) 5 MG tablet Take 1 tablet (5 mg total) by mouth daily. 90 tablet 1   tirzepatide (MOUNJARO) 2.5 MG/0.5ML Pen Inject 2.5 mg into the skin once a week. 2 mL 0   valsartan  (DIOVAN ) 320 MG tablet Take 1 tablet (320 mg total) by mouth daily. 90 tablet 1   No current facility-administered medications on file prior to visit.   Past Medical History:  Diagnosis Date   Chronic diastolic heart failure (HCC) 11/18/2022   Dyslipidemia associated with type 2 diabetes mellitus (HCC) 09/09/2021   Encounter for hepatitis C screening test for low risk patient 08/17/2023   Encounter for immunization 08/17/2023   Essential hypertension 08/15/2020   Fibroids    Immunization due 08/17/2023   Morbid obesity with body mass index (BMI) of 45.0 to 49.9 in adult [facility]) 07/30/2020   Morbid obesity with body mass index (BMI) of 50.0 to 59.9 in adult [facility]) 07/30/2020   OSA (obstructive sleep apnea) 07/30/2020   Primary osteoarthritis of left knee 11/18/2022   Screening for HIV without presence of risk factors 08/17/2023   Urge incontinence 07/30/2020   Past Surgical History:  Procedure Laterality Date   conescopy     abnormal pap   LASIK     TOTAL ABDOMINAL HYSTERECTOMY     precancerous cervical cells and fibroids.    TUBAL LIGATION      Family History  Problem Relation Age of Onset   Diabetes Mother    Diabetes Father    Heart attack Father    Diabetes Sister    Breast cancer Neg Hx    Social History   Socioeconomic History   Marital status: Divorced    Spouse name: Not on file   Number of children: Not on file   Years of education: Not on file   Highest education level: Some college, no degree  Occupational History   Not on file  Tobacco Use   Smoking status: Former  Current packs/day: 0.00    Types: Cigarettes    Quit date: 1980    Years since quitting: 45.8   Smokeless tobacco: Never  Substance and Sexual Activity   Alcohol use: Yes    Alcohol/week: 1.0 standard drink of alcohol    Types: 1 Standard drinks or equivalent per week    Comment: per month   Drug use: Never   Sexual activity: Not on file  Other Topics Concern   Not on file  Social History Narrative   Not on file   Social Drivers of Health   Financial Resource Strain: Medium Risk (10/04/2024)   Overall Financial Resource Strain (CARDIA)    Difficulty of Paying Living Expenses: Somewhat hard  Food Insecurity: Food Insecurity Present (10/04/2024)   Hunger Vital Sign    Worried About Running Out of Food in the Last Year: Sometimes true    Ran Out of Food in the Last Year: Sometimes true  Transportation Needs: No Transportation Needs (10/04/2024)   PRAPARE - Administrator, Civil Service (Medical): No    Lack of Transportation (Non-Medical): No  Physical Activity: Insufficiently Active (10/04/2024)   Exercise Vital Sign    Days of Exercise per Week: 1 day    Minutes of Exercise per Session: 10 min  Stress: Stress Concern Present (10/04/2024)   [employer] of Occupational Health - Occupational Stress Questionnaire    Feeling of Stress: To some extent  Social Connections: Moderately Isolated (10/04/2024)   Social Connection and Isolation Panel    Frequency of Communication with Friends and  Family: Once a week    Frequency of Social Gatherings with Friends and Family: Once a week    Attends Religious Services: 1 to 4 times per year    Active Member of [employer] or Organizations: Yes    Attends Engineer, Structural: More than 4 times per year    Marital Status: Divorced    Objective:  BP 120/78   Pulse 80   Temp 98.3 F (36.8 C) (Temporal)   Resp 18   Ht 5' 5 (1.651 m)   Wt 299 lb 3.2 oz (135.7 kg)   SpO2 96%   BMI 49.79 kg/m      10/05/2024    9:24 AM 09/07/2024    2:03 PM 06/01/2024    8:40 AM  BP/Weight  Systolic BP 120 132 132  Diastolic BP 78 82 74  Wt. (Lbs) 299.2 298 299  BMI 49.79 kg/m2 49.59 kg/m2 49.76 kg/m2    Physical Exam Vitals reviewed.  Constitutional:      General: She is not in acute distress.    Appearance: Normal appearance. She is obese. She is not ill-appearing.  Eyes:     Conjunctiva/sclera: Conjunctivae normal.  Cardiovascular:     Rate and Rhythm: Normal rate and regular rhythm.     Heart sounds: Normal heart sounds. No murmur heard. Pulmonary:     Effort: Pulmonary effort is normal.     Breath sounds: Normal breath sounds. No wheezing.  Abdominal:     General: Bowel sounds are normal.     Palpations: Abdomen is soft.  Musculoskeletal:        General: Normal range of motion.  Skin:    General: Skin is warm and dry.     Findings: Lesion present.  Neurological:     Mental Status: She is alert. Mental status is at baseline.  Psychiatric:        Mood and Affect: Mood  normal.        Behavior: Behavior normal.       Lab Results  Component Value Date   WBC 7.7 09/03/2024   HGB 11.9 09/03/2024   HCT 40.6 09/03/2024   PLT 280 09/03/2024   GLUCOSE 107 (H) 09/03/2024   CHOL 103 09/03/2024   TRIG 70 09/03/2024   HDL 36 (L) 09/03/2024   LDLCALC 52 09/03/2024   ALT 12 09/03/2024   AST 17 09/03/2024   NA 141 09/03/2024   K 4.4 09/03/2024   CL 106 09/03/2024   CREATININE 0.91 09/03/2024   BUN 20 09/03/2024    CO2 21 09/03/2024   TSH 1.590 04/23/2023   HGBA1C 6.2 (H) 09/03/2024    Results for orders placed or performed in visit on 09/03/24  CBC with Differential/Platelet   Collection Time: 09/03/24  8:51 AM  Result Value Ref Range   WBC 7.7 3.4 - 10.8 x10E3/uL   RBC 5.03 3.77 - 5.28 x10E6/uL   Hemoglobin 11.9 11.1 - 15.9 g/dL   Hematocrit 59.3 65.9 - 46.6 %   MCV 81 79 - 97 fL   MCH 23.7 (L) 26.6 - 33.0 pg   MCHC 29.3 (L) 31.5 - 35.7 g/dL   RDW 85.0 88.2 - 84.5 %   Platelets 280 150 - 450 x10E3/uL   Neutrophils 63 Not Estab. %   Lymphs 25 Not Estab. %   Monocytes 9 Not Estab. %   Eos 2 Not Estab. %   Basos 1 Not Estab. %   Neutrophils Absolute 4.9 1.4 - 7.0 x10E3/uL   Lymphocytes Absolute 1.9 0.7 - 3.1 x10E3/uL   Monocytes Absolute 0.7 0.1 - 0.9 x10E3/uL   EOS (ABSOLUTE) 0.2 0.0 - 0.4 x10E3/uL   Basophils Absolute 0.1 0.0 - 0.2 x10E3/uL   Immature Granulocytes 0 Not Estab. %   Immature Grans (Abs) 0.0 0.0 - 0.1 x10E3/uL  Comprehensive metabolic panel with GFR   Collection Time: 09/03/24  8:51 AM  Result Value Ref Range   Glucose 107 (H) 70 - 99 mg/dL   BUN 20 8 - 27 mg/dL   Creatinine, Ser 9.08 0.57 - 1.00 mg/dL   eGFR 70 >40 fO/fpw/8.26   BUN/Creatinine Ratio 22 12 - 28   Sodium 141 134 - 144 mmol/L   Potassium 4.4 3.5 - 5.2 mmol/L   Chloride 106 96 - 106 mmol/L   CO2 21 20 - 29 mmol/L   Calcium  9.1 8.7 - 10.3 mg/dL   Total Protein 6.6 6.0 - 8.5 g/dL   Albumin 4.2 3.9 - 4.9 g/dL   Globulin, Total 2.4 1.5 - 4.5 g/dL   Bilirubin Total 0.3 0.0 - 1.2 mg/dL   Alkaline Phosphatase 58 49 - 135 IU/L   AST 17 0 - 40 IU/L   ALT 12 0 - 32 IU/L  Hemoglobin A1c   Collection Time: 09/03/24  8:51 AM  Result Value Ref Range   Hgb A1c MFr Bld 6.2 (H) 4.8 - 5.6 %   Est. average glucose Bld gHb Est-mCnc 131 mg/dL  Lipid panel   Collection Time: 09/03/24  8:51 AM  Result Value Ref Range   Cholesterol, Total 103 100 - 199 mg/dL   Triglycerides 70 0 - 149 mg/dL   HDL 36 (L) >60 mg/dL    VLDL Cholesterol Cal 15 5 - 40 mg/dL   LDL Chol Calc (NIH) 52 0 - 99 mg/dL   Chol/HDL Ratio 2.9 0.0 - 4.4 ratio  Iron, TIBC and Ferritin Panel  Collection Time: 09/03/24  8:51 AM  Result Value Ref Range   Total Iron Binding Capacity 331 250 - 450 ug/dL   UIBC 717 881 - 630 ug/dL   Iron 49 27 - 860 ug/dL   Iron Saturation 15 15 - 55 %   Ferritin 28 15 - 150 ng/mL  .  Assessment & Plan:   Assessment & Plan Dyslipidemia associated with type 2 diabetes mellitus (HCC) Type 2 diabetes well controlled. Mounjaro not approved; Ozempic covered by insurance. Explained GLP-1 receptor agonist mechanism, side effects, and medication trial requirements. Educated on injection technique. Lab Results  Component Value Date   HGBA1C 6.2 (H) 09/03/2024  - Prescribe Ozempic and send prescription to pharmacy. - Schedule follow-up in six weeks to assess response to Ozempic. - Discuss potential side effects and management, including contacting the office if nausea or other side effects occur.  Dyslipidemia Well controlled Last lipids Lab Results  Component Value Date   CHOL 103 09/03/2024   HDL 36 (L) 09/03/2024   LDLCALC 52 09/03/2024   TRIG 70 09/03/2024   CHOLHDL 2.9 09/03/2024  - Continue Crestor  5 mg once daily - Recommend continue working on diet and exercise  Orders:   Semaglutide,0.25 or 0.5MG /DOS, (OZEMPIC, 0.25 OR 0.5 MG/DOSE,) 2 MG/3ML SOPN; Inject 0.25 mg into the skin once a week.   Skin lesion Skin lesion, possible mole New skin lesion on right leg, possibly a mole.  - Refer to dermatology for evaluation and management.  Orders:   Ambulatory referral to Dermatology  Vertigo Dizziness Intermittent dizziness that resolves quickly. Differentials include low blood sugar, dehydration or BPPH. - Consider carrying meclizine for symptomatic relief if dizziness occurs. - Consider referral to ENT if symptoms get worse or prescribed medication is not working.     Encounter for  immunization Need for Covid-19 vaccine, requested Orders:   Pfizer Comirnaty Covid-19 Vaccine 9yrs & older  Encounter for immunization Need for Flu vaccine, requested Orders:   Flu vaccine, recombinant, trivalent, inj   Follow-up: Return in about 6 weeks (around 11/16/2024) for med check.  An After Visit Summary was printed and given to the patient.  Harrie Cedar, FNP [name] [phone]

## 2024-10-06 ENCOUNTER — Other Ambulatory Visit

## 2024-10-06 DIAGNOSIS — Z006 Encounter for examination for normal comparison and control in clinical research program: Secondary | ICD-10-CM

## 2024-10-07 DIAGNOSIS — E1169 Type 2 diabetes mellitus with other specified complication: Secondary | ICD-10-CM

## 2024-10-08 ENCOUNTER — Other Ambulatory Visit: Payer: Self-pay

## 2024-10-08 DIAGNOSIS — E1169 Type 2 diabetes mellitus with other specified complication: Secondary | ICD-10-CM

## 2024-10-08 MED ORDER — OZEMPIC (0.25 OR 0.5 MG/DOSE) 2 MG/3ML ~~LOC~~ SOPN: 0.2500 mg | PEN_INJECTOR | SUBCUTANEOUS | 0 refills | Status: DC

## 2024-10-15 LAB — GENECONNECT MOLECULAR SCREEN: Genetic Analysis Overall Interpretation: NEGATIVE

## 2024-10-25 ENCOUNTER — Other Ambulatory Visit: Payer: Self-pay | Admitting: Cardiology

## 2024-10-25 DIAGNOSIS — F331 Major depressive disorder, recurrent, moderate: Secondary | ICD-10-CM | POA: Diagnosis not present

## 2024-11-09 DIAGNOSIS — L578 Other skin changes due to chronic exposure to nonionizing radiation: Secondary | ICD-10-CM | POA: Diagnosis not present

## 2024-11-09 DIAGNOSIS — L820 Inflamed seborrheic keratosis: Secondary | ICD-10-CM | POA: Diagnosis not present

## 2024-11-09 DIAGNOSIS — L814 Other melanin hyperpigmentation: Secondary | ICD-10-CM | POA: Diagnosis not present

## 2024-11-16 ENCOUNTER — Ambulatory Visit: Admitting: Family Medicine

## 2024-11-17 ENCOUNTER — Encounter: Payer: Self-pay | Admitting: Family Medicine

## 2024-11-17 ENCOUNTER — Ambulatory Visit: Admitting: Family Medicine

## 2024-11-17 VITALS — BP 128/84 | HR 87 | Temp 97.8°F | Resp 18 | Ht 65.0 in | Wt 294.4 lb

## 2024-11-17 DIAGNOSIS — R7989 Other specified abnormal findings of blood chemistry: Secondary | ICD-10-CM | POA: Insufficient documentation

## 2024-11-17 DIAGNOSIS — R202 Paresthesia of skin: Secondary | ICD-10-CM | POA: Diagnosis not present

## 2024-11-17 DIAGNOSIS — E785 Hyperlipidemia, unspecified: Secondary | ICD-10-CM | POA: Diagnosis not present

## 2024-11-17 DIAGNOSIS — E1169 Type 2 diabetes mellitus with other specified complication: Secondary | ICD-10-CM | POA: Diagnosis not present

## 2024-11-17 DIAGNOSIS — R42 Dizziness and giddiness: Secondary | ICD-10-CM | POA: Diagnosis not present

## 2024-11-17 DIAGNOSIS — R200 Anesthesia of skin: Secondary | ICD-10-CM | POA: Insufficient documentation

## 2024-11-17 HISTORY — DX: Anesthesia of skin: R20.0

## 2024-11-17 HISTORY — DX: Dizziness and giddiness: R42

## 2024-11-17 HISTORY — DX: Other specified abnormal findings of blood chemistry: R79.89

## 2024-11-17 MED ORDER — OZEMPIC (0.25 OR 0.5 MG/DOSE) 2 MG/3ML ~~LOC~~ SOPN: 0.2500 mg | PEN_INJECTOR | SUBCUTANEOUS | 0 refills | Status: DC

## 2024-11-17 MED ORDER — OZEMPIC (0.25 OR 0.5 MG/DOSE) 2 MG/3ML ~~LOC~~ SOPN: 0.5000 mg | PEN_INJECTOR | SUBCUTANEOUS | 0 refills | Status: DC

## 2024-11-17 NOTE — Assessment & Plan Note (Addendum)
 Abnormal CBC Previous CBC showed microcytic anemia. Reassessing with current labs for B12 or iron deficiency. - Ordered CBC. - Ordered iron studies.   Orders:   CBC with Differential   Iron, TIBC and Ferritin Panel

## 2024-11-17 NOTE — Assessment & Plan Note (Addendum)
 Dizziness Dizziness postprandial and around 2 PM, possibly hypoglycemia or dehydration. Discussed checking blood sugar during episodes. - Advised checking blood sugar during dizziness. - Encouraged adequate hydration. Orders:   CBC with Differential   Comprehensive metabolic panel with GFR

## 2024-11-17 NOTE — Assessment & Plan Note (Addendum)
 Numbness and tingling of both upper extremities Numbness and tingling from shoulders to fingertips for a month. Differential includes cervical radiculopathy, B12 deficiency, carpal tunnel syndrome, and diabetic neuropathy. No neck pain. Discussed potential causes and diagnostics. - Ordered B12 and folate levels. - Ordered CBC and iron studies. - Ordered CMP. - Ordered cervical spine x-ray. - Consider gabapentin if neuropathy confirmed. Orders:   B12 and Folate Panel   DG Cervical Spine Complete; Future   Comprehensive metabolic panel with GFR

## 2024-11-17 NOTE — Progress Notes (Signed)
 Subjective:  Patient ID: Virginia Barron, female    DOB: Jan 22, 1961  Age: 63 y.o. MRN: [id number]  Chief Complaint  Patient presents with   Medical Management of Chronic Issues    Discussed the use of AI scribe software for clinical note transcription with the patient, who gave verbal consent to proceed.  History of Present Illness   Virginia Barron is a 63 year old female with diabetes who presents for a follow-up visit regarding her Ozempic  treatment.  Glucagon-like peptide-1 (glp-1) agonist therapy - On Ozempic  for approximately 1.5 months, recently increased dose to 0.5 mg. - Current supply depleted and requires new prescription. - Weight loss observed, decreasing from 299 lbs to 294 lbs.  Gastrointestinal symptoms - Mild reflux symptoms managed by monitoring food intake and eating smaller portions. - No constipation. - Bowel movements described as 'sticky'.  Peripheral neuropathy symptoms - Numbness and tingling extending from shoulders to fingertips, present for about one month. - Symptoms associated with increased physical activity, such as moving items around the house. - No neck pain. - No x-rays of the neck performed. - No medication taken for these symptoms.  Dizziness and lightheadedness - Dizziness and lightheadedness occurring particularly after eating and around 2 PM daily. - Attributes symptoms to possible inadequate fluid intake. - Regularly checks blood sugar but has not checked during episodes of dizziness.        09/07/2024    2:02 PM 06/01/2024    8:46 AM 11/25/2023    9:12 AM 08/13/2023    9:11 AM 04/23/2023    7:44 AM  Depression screen PHQ 2/9  Decreased Interest 1 1 2  0 1  Down, Depressed, Hopeless 0 0 2 0 1  PHQ - 2 Score 1 1 4  0 2  Altered sleeping 0 1 1 0 0  Tired, decreased energy 3 1 3  0 3  Change in appetite 0 0 0 0 1  Feeling bad or failure about yourself  0 0 0 0 0  Trouble concentrating 1 1 0 0 2  Moving slowly or  fidgety/restless 0 0 0 0 0  Suicidal thoughts 0 0 2 0 0  PHQ-9 Score 5  4  10   0  8   Difficult doing work/chores Not difficult at all Not difficult at all Somewhat difficult Not difficult at all Not difficult at all     Data saved with a previous flowsheet row definition        09/07/2024    2:02 PM  Fall Risk   Falls in the past year? 0  Number falls in past yr: 0  Injury with Fall? 0   Risk for fall due to : No Fall Risks  Follow up Falls evaluation completed     Data saved with a previous flowsheet row definition    Patient Care Team: Sherre Clapper, MD as PCP - General (Family Medicine) Tobb, Kardie, DO as PCP - Cardiology (Cardiology) Beverli Cara PARAS, [facility] (Inactive) (Pharmacist)   Review of Systems  Constitutional:  Negative for chills, diaphoresis, fatigue and fever.  HENT:  Negative for congestion, ear pain and sinus pain.   Eyes: Negative.   Respiratory:  Negative for cough and shortness of breath.   Cardiovascular:  Negative for chest pain and palpitations.  Gastrointestinal:  Negative for abdominal pain, constipation, diarrhea, nausea and vomiting.  Endocrine: Negative.   Genitourinary:  Negative for dysuria, frequency and urgency.  Musculoskeletal:  Negative for arthralgias.  Allergic/Immunologic: Negative.   Neurological:  Positive for dizziness, light-headedness and numbness (bilateral arms). Negative for weakness and headaches.  Psychiatric/Behavioral:  Negative for dysphoric mood. The patient is not nervous/anxious.     Current Outpatient Medications on File Prior to Visit  Medication Sig Dispense Refill   ACCU-CHEK GUIDE TEST test strip USE TO CHECK GLUCOSE UP TO TWICE DAILY. 100 each 2   Accu-Chek Softclix Lancets lancets USE TO CHECK BLOOD SUGAR THREE TIMES DAILY 100 each 2   acetaminophen (TYLENOL) 500 MG tablet Take 500 mg by mouth every 6 (six) hours as needed for moderate pain (pain score 4-6) or mild pain (pain score 1-3).     amLODipine  (NORVASC ) 5  MG tablet Take 1 tablet (5 mg total) by mouth daily. 90 tablet 1   Blood Glucose Monitoring Suppl DEVI Use to check glucose up to twice daily. May substitute to any manufacturer covered by patient's insurance. 1 each 0   escitalopram  (LEXAPRO ) 10 MG tablet Take 10 mg by mouth daily.     Lancet Device MISC Use to check glucose up to twice daily. May substitute to any manufacturer covered by patient's insurance. 1 each 2   Lancets Misc. MISC Use to check glucose up to twice daily. May substitute to any manufacturer covered by patient's insurance. 100 each 2   meclizine  (ANTIVERT ) 25 MG tablet Take 1 tablet (25 mg total) by mouth 3 (three) times daily as needed for dizziness. 30 tablet 1   metFORMIN  (GLUCOPHAGE ) 500 MG tablet Take 1 tablet (500 mg total) by mouth daily with breakfast. 90 tablet 0   Multiple Vitamin (MULTIVITAMIN) tablet Take 1 tablet by mouth daily.     rosuvastatin  (CRESTOR ) 5 MG tablet Take 1 tablet (5 mg total) by mouth daily. 90 tablet 1   valsartan  (DIOVAN ) 320 MG tablet Take 1 tablet (320 mg total) by mouth daily. 90 tablet 1   No current facility-administered medications on file prior to visit.   Past Medical History:  Diagnosis Date   Chronic diastolic heart failure (HCC) 11/18/2022   Dyslipidemia associated with type 2 diabetes mellitus (HCC) 09/09/2021   Encounter for hepatitis C screening test for low risk patient 08/17/2023   Encounter for immunization 08/17/2023   Essential hypertension 08/15/2020   Fibroids    Immunization due 08/17/2023   Morbid obesity with body mass index (BMI) of 45.0 to 49.9 in adult [facility]) 07/30/2020   Morbid obesity with body mass index (BMI) of 50.0 to 59.9 in adult [facility]) 07/30/2020   OSA (obstructive sleep apnea) 07/30/2020   Primary osteoarthritis of left knee 11/18/2022   Screening for HIV without presence of risk factors 08/17/2023   Urge incontinence 07/30/2020   Past Surgical History:  Procedure Laterality Date   conescopy      abnormal pap   LASIK     TOTAL ABDOMINAL HYSTERECTOMY     precancerous cervical cells and fibroids.   TUBAL LIGATION      Family History  Problem Relation Age of Onset   Diabetes Mother    Diabetes Father    Heart attack Father    Diabetes Sister    Breast cancer Neg Hx    Social History   Socioeconomic History   Marital status: Divorced    Spouse name: Not on file   Number of children: Not on file   Years of education: Not on file   Highest education level: Some college, no degree  Occupational History   Not on file  Tobacco Use   Smoking status:  Former    Current packs/day: 0.00    Types: Cigarettes    Quit date: 1980    Years since quitting: 45.9   Smokeless tobacco: Never  Substance and Sexual Activity   Alcohol use: Yes    Alcohol/week: 1.0 standard drink of alcohol    Types: 1 Standard drinks or equivalent per week    Comment: per month   Drug use: Never   Sexual activity: Not on file  Other Topics Concern   Not on file  Social History Narrative   Not on file   Social Drivers of Health   Financial Resource Strain: Medium Risk (10/04/2024)   Overall Financial Resource Strain (CARDIA)    Difficulty of Paying Living Expenses: Somewhat hard  Food Insecurity: Food Insecurity Present (10/04/2024)   Hunger Vital Sign    Worried About Running Out of Food in the Last Year: Sometimes true    Ran Out of Food in the Last Year: Sometimes true  Transportation Needs: No Transportation Needs (10/04/2024)   PRAPARE - Administrator, Civil Service (Medical): No    Lack of Transportation (Non-Medical): No  Physical Activity: Insufficiently Active (10/04/2024)   Exercise Vital Sign    Days of Exercise per Week: 1 day    Minutes of Exercise per Session: 10 min  Stress: Stress Concern Present (10/04/2024)   [employer] of Occupational Health - Occupational Stress Questionnaire    Feeling of Stress: To some extent  Social Connections: Moderately Isolated  (10/04/2024)   Social Connection and Isolation Panel    Frequency of Communication with Friends and Family: Once a week    Frequency of Social Gatherings with Friends and Family: Once a week    Attends Religious Services: 1 to 4 times per year    Active Member of [employer] or Organizations: Yes    Attends Engineer, Structural: More than 4 times per year    Marital Status: Divorced    Objective:  BP 128/84   Pulse 87   Temp 97.8 F (36.6 C) (Temporal)   Resp 18   Ht 5' 5 (1.651 m)   Wt 294 lb 6.4 oz (133.5 kg)   SpO2 98%   BMI 48.99 kg/m      11/17/2024    9:09 AM 10/05/2024    9:24 AM 09/07/2024    2:03 PM  BP/Weight  Systolic BP 128 120 132  Diastolic BP 84 78 82  Wt. (Lbs) 294.4 299.2 298  BMI 48.99 kg/m2 49.79 kg/m2 49.59 kg/m2    Physical Exam Vitals reviewed.  Constitutional:      General: She is not in acute distress.    Appearance: Normal appearance. She is obese. She is not ill-appearing.  HENT:     Nose: No congestion or rhinorrhea.  Eyes:     Conjunctiva/sclera: Conjunctivae normal.  Cardiovascular:     Rate and Rhythm: Normal rate and regular rhythm.     Pulses: Normal pulses.     Heart sounds: Normal heart sounds. No murmur heard. Pulmonary:     Effort: Pulmonary effort is normal.     Breath sounds: Normal breath sounds. No wheezing.  Abdominal:     Palpations: Abdomen is soft.  Musculoskeletal:        General: Normal range of motion.  Skin:    General: Skin is warm.  Neurological:     Mental Status: She is alert and oriented to person, place, and time. Mental status is at baseline.  Motor: No weakness.  Psychiatric:        Attention and Perception: Attention normal.        Mood and Affect: Mood normal.        Behavior: Behavior normal.     Lab Results  Component Value Date   WBC 7.7 09/03/2024   HGB 11.9 09/03/2024   HCT 40.6 09/03/2024   PLT 280 09/03/2024   GLUCOSE 107 (H) 09/03/2024   CHOL 103 09/03/2024   TRIG 70  09/03/2024   HDL 36 (L) 09/03/2024   LDLCALC 52 09/03/2024   ALT 12 09/03/2024   AST 17 09/03/2024   NA 141 09/03/2024   K 4.4 09/03/2024   CL 106 09/03/2024   CREATININE 0.91 09/03/2024   BUN 20 09/03/2024   CO2 21 09/03/2024   TSH 1.590 04/23/2023   HGBA1C 6.2 (H) 09/03/2024    Results for orders placed or performed in visit on 10/06/24  [facility] Molecular Screen-Oral   Collection Time: 10/06/24 10:34 AM  Result Value Ref Range   Genetic Analysis Overall Interpretation Negative    Genetic Disease Assessed      This is a screening test and does not detect all pathogenic or likely pathogenic variant(s) in the tested genes; diagnostic testing is recommended for individuals with a personal or family history of heart disease or hereditary cancer. [employer]  Population Screen is a screening test that analyzes 11 genes related to hereditary breast and ovarian cancer (HBOC) syndrome, Lynch syndrome, and familial hypercholesterolemia. This test only reports clinically significant pathogenic and likely  pathogenic variants but does not report variants of uncertain significance (VUS). In addition, analysis of the PMS2 gene excludes exons 11-15, which overlap with a known pseudogene (PMS2CL).    Genetic Analysis Report      No pathogenic or likely pathogenic variants were detected in the genes analyzed by this test.Genetic test results should be interpreted in the context of an individual's personal medical and family history. Alteration to medical management is NOT  recommended based solely on this result. Clinical correlation is advised.Additional Considerations- This is a screening test; individuals may still carry pathogenic or likely pathogenic variant(s) in the tested genes that are not detected by this test.-  For individuals at risk for these or other related conditions based on factors including personal or family history, diagnostic testing is recommended.- The absence of  pathogenic or likely pathogenic variant(s) in the analyzed genes, while reassuring,  does not eliminate the possibility of a hereditary condition; there are other variants and genes associated with heart disease and hereditary cancer that are not included in this test.    Genes Tested See Notes    Disclaimer See Notes    Sequencing Location See Notes    Interpretation Methods and Limitations See Notes   .  Assessment & Plan:   Assessment & Plan Numbness and tingling of both upper extremities Numbness and tingling of both upper extremities Numbness and tingling from shoulders to fingertips for a month. Differential includes cervical radiculopathy, B12 deficiency, carpal tunnel syndrome, and diabetic neuropathy. No neck pain. Discussed potential causes and diagnostics. - Ordered B12 and folate levels. - Ordered CBC and iron studies. - Ordered CMP. - Ordered cervical spine x-ray. - Consider gabapentin if neuropathy confirmed. Orders:   B12 and Folate Panel   DG Cervical Spine Complete; Future   Comprehensive metabolic panel with GFR  Dyslipidemia associated with type 2 diabetes mellitus (HCC) Type 2 diabetes mellitus with dyslipidemia On Ozempic   for weight management and glycemic control. Mild reflux and increased thirst noted. Weight decreased from 299 lbs to 294 lbs. Discussed gradual weight loss to avoid saggy skin and health risks of rapid weight loss. - Prescribed Ozempic  0.5 mg. - Advised dietary modifications for reflux. - Encouraged gradual weight loss through diet and exercise. Orders:   Semaglutide ,0.25 or 0.5MG /DOS, (OZEMPIC , 0.25 OR 0.5 MG/DOSE,) 2 MG/3ML SOPN; Inject 0.5 mg into the skin once a week.   Hemoglobin A1c; Future   Microalbumin/Creatinine Ratio, Urine; Future  Abnormal CBC Abnormal CBC Previous CBC showed microcytic anemia. Reassessing with current labs for B12 or iron deficiency. - Ordered CBC. - Ordered iron studies.   Orders:   CBC with  Differential   Iron, TIBC and Ferritin Panel  Dizziness Dizziness Dizziness postprandial and around 2 PM, possibly hypoglycemia or dehydration. Discussed checking blood sugar during episodes. - Advised checking blood sugar during dizziness. - Encouraged adequate hydration. Orders:   CBC with Differential   Comprehensive metabolic panel with GFR       Meds ordered this encounter  Medications   DISCONTD: Semaglutide ,0.25 or 0.5MG /DOS, (OZEMPIC , 0.25 OR 0.5 MG/DOSE,) 2 MG/3ML SOPN    Sig: Inject 0.25 mg into the skin once a week.    Dispense:  3 mL    Refill:  0   Semaglutide ,0.25 or 0.5MG /DOS, (OZEMPIC , 0.25 OR 0.5 MG/DOSE,) 2 MG/3ML SOPN    Sig: Inject 0.5 mg into the skin once a week.    Dispense:  3 mL    Refill:  0    Orders Placed This Encounter  Procedures   DG Cervical Spine Complete   B12 and Folate Panel   CBC with Differential   Iron, TIBC and Ferritin Panel   Comprehensive metabolic panel with GFR   Hemoglobin A1c     Follow-up: Return in about 4 months (around 03/18/2025) for chronic, fasting, lab visit.  An After Visit Summary was printed and given to the patient.  Harrie Cedar, FNP [name] [phone]

## 2024-11-17 NOTE — Assessment & Plan Note (Addendum)
 Type 2 diabetes mellitus with dyslipidemia On Ozempic  for weight management and glycemic control. Mild reflux and increased thirst noted. Weight decreased from 299 lbs to 294 lbs. Discussed gradual weight loss to avoid saggy skin and health risks of rapid weight loss. - Prescribed Ozempic  0.5 mg. - Advised dietary modifications for reflux. - Encouraged gradual weight loss through diet and exercise. Orders:   Semaglutide ,0.25 or 0.5MG /DOS, (OZEMPIC , 0.25 OR 0.5 MG/DOSE,) 2 MG/3ML SOPN; Inject 0.5 mg into the skin once a week.   Hemoglobin A1c; Future   Microalbumin/Creatinine Ratio, Urine; Future

## 2024-11-18 ENCOUNTER — Ambulatory Visit: Payer: Self-pay | Admitting: Family Medicine

## 2024-11-18 LAB — COMPREHENSIVE METABOLIC PANEL WITH GFR
ALT: 14 IU/L (ref 0–32)
AST: 17 IU/L (ref 0–40)
Albumin: 4.3 g/dL (ref 3.9–4.9)
Alkaline Phosphatase: 60 IU/L (ref 49–135)
BUN/Creatinine Ratio: 15 (ref 12–28)
BUN: 16 mg/dL (ref 8–27)
Bilirubin Total: 0.3 mg/dL (ref 0.0–1.2)
CO2: 20 mmol/L (ref 20–29)
Calcium: 9.5 mg/dL (ref 8.7–10.3)
Chloride: 105 mmol/L (ref 96–106)
Creatinine, Ser: 1.09 mg/dL — ABNORMAL HIGH (ref 0.57–1.00)
Globulin, Total: 2.4 g/dL (ref 1.5–4.5)
Glucose: 88 mg/dL (ref 70–99)
Potassium: 4.3 mmol/L (ref 3.5–5.2)
Sodium: 142 mmol/L (ref 134–144)
Total Protein: 6.7 g/dL (ref 6.0–8.5)
eGFR: 57 mL/min/1.73 — ABNORMAL LOW (ref >59)

## 2024-11-18 LAB — CBC WITH DIFFERENTIAL/PLATELET
Basophils Absolute: 0.1 x10E3/uL (ref 0.0–0.2)
Basos: 1 %
EOS (ABSOLUTE): 0.2 x10E3/uL (ref 0.0–0.4)
Eos: 3 %
Hematocrit: 42.7 % (ref 34.0–46.6)
Hemoglobin: 13.6 g/dL (ref 11.1–15.9)
Immature Grans (Abs): 0 x10E3/uL (ref 0.0–0.1)
Immature Granulocytes: 0 %
Lymphocytes Absolute: 1.7 x10E3/uL (ref 0.7–3.1)
Lymphs: 23 %
MCH: 25.1 pg — ABNORMAL LOW (ref 26.6–33.0)
MCHC: 31.9 g/dL (ref 31.5–35.7)
MCV: 79 fL (ref 79–97)
Monocytes Absolute: 0.6 x10E3/uL (ref 0.1–0.9)
Monocytes: 9 %
Neutrophils Absolute: 4.8 x10E3/uL (ref 1.4–7.0)
Neutrophils: 64 %
Platelets: 291 x10E3/uL (ref 150–450)
RBC: 5.42 x10E6/uL — ABNORMAL HIGH (ref 3.77–5.28)
RDW: 13.3 % (ref 11.7–15.4)
WBC: 7.4 x10E3/uL (ref 3.4–10.8)

## 2024-11-18 LAB — IRON,TIBC AND FERRITIN PANEL
Ferritin: 23 ng/mL (ref 15–150)
Iron Saturation: 19 % (ref 15–55)
Iron: 56 ug/dL (ref 27–139)
Total Iron Binding Capacity: 291 ug/dL (ref 250–450)
UIBC: 235 ug/dL (ref 118–369)

## 2024-11-18 LAB — B12 AND FOLATE PANEL
Folate: >20 ng/mL (ref >3.0)
Vitamin B-12: 1254 pg/mL — ABNORMAL HIGH (ref 232–1245)

## 2024-11-19 ENCOUNTER — Ambulatory Visit (INDEPENDENT_AMBULATORY_CARE_PROVIDER_SITE_OTHER)
Admission: RE | Admit: 2024-11-19 | Discharge: 2024-11-19 | Disposition: A | Discharge status: Home / Self Care | Source: Ambulatory Visit | Attending: Family Medicine | Admitting: Family Medicine

## 2024-11-19 DIAGNOSIS — R200 Anesthesia of skin: Secondary | ICD-10-CM | POA: Diagnosis not present

## 2024-11-19 DIAGNOSIS — M47812 Spondylosis without myelopathy or radiculopathy, cervical region: Secondary | ICD-10-CM | POA: Diagnosis not present

## 2024-11-19 DIAGNOSIS — R202 Paresthesia of skin: Secondary | ICD-10-CM | POA: Diagnosis not present

## 2024-11-19 DIAGNOSIS — M4802 Spinal stenosis, cervical region: Secondary | ICD-10-CM | POA: Diagnosis not present

## 2024-12-07 ENCOUNTER — Other Ambulatory Visit

## 2024-12-07 DIAGNOSIS — E1169 Type 2 diabetes mellitus with other specified complication: Secondary | ICD-10-CM

## 2024-12-07 DIAGNOSIS — E785 Hyperlipidemia, unspecified: Secondary | ICD-10-CM | POA: Diagnosis not present

## 2024-12-08 LAB — MICROALBUMIN / CREATININE URINE RATIO
Creatinine, Urine: 158.2 mg/dL
Microalb/Creat Ratio: 5 mg/g{creat} (ref 0–29)
Microalbumin, Urine: 8.4 ug/mL

## 2024-12-08 LAB — HEMOGLOBIN A1C
Est. average glucose Bld gHb Est-mCnc: 134 mg/dL
Hgb A1c MFr Bld: 6.3 % — ABNORMAL HIGH (ref 4.8–5.6)

## 2024-12-09 ENCOUNTER — Other Ambulatory Visit: Payer: Self-pay | Admitting: Family Medicine

## 2024-12-09 DIAGNOSIS — E1169 Type 2 diabetes mellitus with other specified complication: Secondary | ICD-10-CM

## 2024-12-09 MED ORDER — OZEMPIC (0.25 OR 0.5 MG/DOSE) 2 MG/3ML ~~LOC~~ SOPN: 0.5000 mg | PEN_INJECTOR | SUBCUTANEOUS | 0 refills | Status: DC

## 2024-12-10 ENCOUNTER — Other Ambulatory Visit: Payer: Self-pay

## 2024-12-10 DIAGNOSIS — E1169 Type 2 diabetes mellitus with other specified complication: Secondary | ICD-10-CM

## 2024-12-10 MED ORDER — OZEMPIC (0.25 OR 0.5 MG/DOSE) 2 MG/3ML ~~LOC~~ SOPN: 0.5000 mg | PEN_INJECTOR | SUBCUTANEOUS | 0 refills | Status: DC

## 2024-12-16 ENCOUNTER — Encounter: Payer: Self-pay | Admitting: Cardiology

## 2024-12-16 ENCOUNTER — Ambulatory Visit: Attending: Cardiology | Admitting: Cardiology

## 2024-12-16 VITALS — BP 133/76 | HR 71 | Ht 65.0 in | Wt 292.4 lb

## 2024-12-16 DIAGNOSIS — E785 Hyperlipidemia, unspecified: Secondary | ICD-10-CM | POA: Diagnosis present

## 2024-12-16 DIAGNOSIS — E1169 Type 2 diabetes mellitus with other specified complication: Secondary | ICD-10-CM | POA: Insufficient documentation

## 2024-12-16 DIAGNOSIS — I10 Essential (primary) hypertension: Secondary | ICD-10-CM | POA: Diagnosis present

## 2024-12-16 DIAGNOSIS — G4733 Obstructive sleep apnea (adult) (pediatric): Secondary | ICD-10-CM | POA: Insufficient documentation

## 2024-12-16 NOTE — Patient Instructions (Signed)

## 2024-12-16 NOTE — Progress Notes (Signed)
 " Cardiology Office Note:    Date:  12/16/2024   ID:  Virginia Barron, DOB Jan 13, 1961, MRN [id number]  PCP:  Sherre Clapper, MD  Cardiologist:  Jennifer JONELLE Crape, MD   Referring MD: Sherre Clapper, MD    ASSESSMENT:    1. Essential hypertension   2. OSA (obstructive sleep apnea)   3. Dyslipidemia associated with type 2 diabetes mellitus (HCC)   4. Morbid obesity (HCC)    PLAN:    In order of problems listed above:  Primary prevention stressed with the patient.  Importance of compliance with diet medication stressed and patient verbalized standing. She was advised to walk at least half an hour a day on a daily basis. Essential hypertension: Blood pressure stable and diet was emphasized.  Lifestyle modification urged.  Salt intake and diet issues discussed. Mixed dyslipidemia: Lipid-lowering medications followed by primary care.  In view of diabetes goal LDL should be less than 70. Diabetes mellitus: Managed by primary care.  Elevated hemoglobin A1c.  KPN numbers discussed with the patient. Morbid obesity: Weight reduction stressed diet emphasized.  Risks of obesity explained and she promises to do better.  I cautioned her against sedentary lifestyle. Patient will be seen in follow-up appointment in 12 months or earlier if the patient has any concerns.    Medication Adjustments/Labs and Tests Ordered: Current medicines are reviewed at length with the patient today.  Concerns regarding medicines are outlined above.  Orders Placed This Encounter  Procedures   EKG 12-Lead   No orders of the defined types were placed in this encounter.    No chief complaint on file.    History of Present Illness:    Virginia Barron is a 64 y.o. female.  Patient has past medical history of essential hypertension, mixed dyslipidemia, diabetes mellitus and morbid obesity.  She leads a sedentary lifestyle and does not exercise on a regular basis.  No chest pain orthopnea or PND.  At the time of my  evaluation, the patient is alert awake oriented and in no distress.  Past Medical History:  Diagnosis Date   Abnormal CBC 11/17/2024   Chronic diastolic heart failure (HCC) 11/18/2022   Class 3 severe obesity with serious comorbidity and body mass index (BMI) of 45.0 to 49.9 in adult [facility]) 06/01/2024   Dizziness 11/17/2024   Dyslipidemia associated with type 2 diabetes mellitus (HCC) 09/09/2021   Encounter for immunization 08/17/2023   Essential hypertension 08/15/2020   Fibroids    GAD (generalized anxiety disorder) 02/28/2024   Numbness and tingling of both upper extremities 11/17/2024   OSA (obstructive sleep apnea) 07/30/2020   Primary osteoarthritis of left knee 11/18/2022   Skin lesion 10/05/2024   Urge incontinence 07/30/2020   Vertigo 09/07/2024   Vision disturbance 06/06/2024    Past Surgical History:  Procedure Laterality Date   conescopy     abnormal pap   LASIK     TOTAL ABDOMINAL HYSTERECTOMY     precancerous cervical cells and fibroids.   TUBAL LIGATION      Current Medications: Active Medications[1]   Allergies:   Avocado, Iodine, Kiwi extract, Penicillins, Porcine (pork) protein-containing drug products, Shellfish allergy, and Latex   Social History   Socioeconomic History   Marital status: Divorced    Spouse name: Not on file   Number of children: Not on file   Years of education: Not on file   Highest education level: Some college, no degree  Occupational History   Not on file  Tobacco  Use   Smoking status: Former    Current packs/day: 0.00    Types: Cigarettes    Quit date: 1980    Years since quitting: 46.0   Smokeless tobacco: Never  Substance and Sexual Activity   Alcohol use: Yes    Alcohol/week: 1.0 standard drink of alcohol    Types: 1 Standard drinks or equivalent per week    Comment: per month   Drug use: Never   Sexual activity: Not on file  Other Topics Concern   Not on file  Social History Narrative   Not on file   Social  Drivers of Health   Tobacco Use: Medium Risk (12/16/2024)   Patient History    Smoking Tobacco Use: Former    Smokeless Tobacco Use: Never    Passive Exposure: Not on file  Financial Resource Strain: Medium Risk (10/04/2024)   Overall Financial Resource Strain (CARDIA)    Difficulty of Paying Living Expenses: Somewhat hard  Food Insecurity: Food Insecurity Present (10/04/2024)   Epic    Worried About Programme Researcher, Broadcasting/film/video in the Last Year: Sometimes true    Ran Out of Food in the Last Year: Sometimes true  Transportation Needs: No Transportation Needs (10/04/2024)   Epic    Lack of Transportation (Medical): No    Lack of Transportation (Non-Medical): No  Physical Activity: Insufficiently Active (10/04/2024)   Exercise Vital Sign    Days of Exercise per Week: 1 day    Minutes of Exercise per Session: 10 min  Stress: Stress Concern Present (10/04/2024)   [employer] of Occupational Health - Occupational Stress Questionnaire    Feeling of Stress: To some extent  Social Connections: Moderately Isolated (10/04/2024)   Social Connection and Isolation Panel    Frequency of Communication with Friends and Family: Once a week    Frequency of Social Gatherings with Friends and Family: Once a week    Attends Religious Services: 1 to 4 times per year    Active Member of Clubs or Organizations: Yes    Attends Banker Meetings: More than 4 times per year    Marital Status: Divorced  Depression (PHQ2-9): Medium Risk (09/07/2024)   Depression (PHQ2-9)    PHQ-2 Score: 5  Alcohol Screen: Low Risk (10/04/2024)   Alcohol Screen    Last Alcohol Screening Score (AUDIT): 1  Housing: Low Risk (10/04/2024)   Epic    Unable to Pay for Housing in the Last Year: No    Number of Times Moved in the Last Year: 1    Homeless in the Last Year: No  Utilities: Not At Risk (02/24/2024)   AHC Utilities    Threatened with loss of utilities: No  Health Literacy: Adequate Health Literacy  (08/13/2023)   B1300 Health Literacy    Frequency of need for help with medical instructions: Never     Family History: The patient's family history includes Diabetes in her father, mother, and sister; Heart attack in her father. There is no history of Breast cancer.  ROS:   Please see the history of present illness.    All other systems reviewed and are negative.  EKGs/Labs/Other Studies Reviewed:    The following studies were reviewed today: .SABRAEKG Interpretation Date/Time:  Thursday December 16 2024 10:02:21 EST Ventricular Rate:  71 PR Interval:  166 QRS Duration:  86 QT Interval:  356 QTC Calculation: 386 R Axis:   49  Text Interpretation: Normal sinus rhythm Possible Inferior infarct , age undetermined T  wave abnormality, consider lateral ischemia Abnormal ECG No previous ECGs available Confirmed by Edwyna Backers [phone]) on 12/16/2024 10:23:28 AM     Recent Labs: 11/17/2024: ALT 14; BUN 16; Creatinine, Ser 1.09; Hemoglobin 13.6; Platelets 291; Potassium 4.3; Sodium 142  Recent Lipid Panel    Component Value Date/Time   CHOL 103 09/03/2024 0851   TRIG 70 09/03/2024 0851   HDL 36 (L) 09/03/2024 0851   CHOLHDL 2.9 09/03/2024 0851   LDLCALC 52 09/03/2024 0851    Physical Exam:    VS:  BP 133/76   Pulse 71   Ht 5' 5 (1.651 m)   Wt 292 lb 6.4 oz (132.6 kg)   SpO2 98%   BMI 48.66 kg/m     Wt Readings from Last 3 Encounters:  12/16/24 292 lb 6.4 oz (132.6 kg)  11/17/24 294 lb 6.4 oz (133.5 kg)  10/05/24 299 lb 3.2 oz (135.7 kg)     GEN: Patient is in no acute distress HEENT: Normal NECK: No JVD; No carotid bruits LYMPHATICS: No lymphadenopathy CARDIAC: Hear sounds regular, 2/6 systolic murmur at the apex. RESPIRATORY:  Clear to auscultation without rales, wheezing or rhonchi  ABDOMEN: Soft, non-tender, non-distended MUSCULOSKELETAL:  No edema; No deformity  SKIN: Warm and dry NEUROLOGIC:  Alert and oriented x 3 PSYCHIATRIC:  Normal affect    Signed, Backers JONELLE Edwyna, MD  12/16/2024 10:25 AM    [facility]     [1]  Current Meds  Medication Sig   ACCU-CHEK GUIDE TEST test strip USE TO CHECK GLUCOSE UP TO TWICE DAILY.   Accu-Chek Softclix Lancets lancets USE TO CHECK BLOOD SUGAR THREE TIMES DAILY   acetaminophen (TYLENOL) 500 MG tablet Take 500 mg by mouth every 6 (six) hours as needed for moderate pain (pain score 4-6) or mild pain (pain score 1-3).   amLODipine  (NORVASC ) 5 MG tablet Take 1 tablet (5 mg total) by mouth daily.   Blood Glucose Monitoring Suppl DEVI Use to check glucose up to twice daily. May substitute to any manufacturer covered by patient's insurance.   escitalopram  (LEXAPRO ) 10 MG tablet Take 10 mg by mouth daily.   Lancet Device MISC Use to check glucose up to twice daily. May substitute to any manufacturer covered by patient's insurance.   Lancets Misc. MISC Use to check glucose up to twice daily. May substitute to any manufacturer covered by patient's insurance.   meclizine  (ANTIVERT ) 25 MG tablet Take 1 tablet (25 mg total) by mouth 3 (three) times daily as needed for dizziness.   metFORMIN  (GLUCOPHAGE ) 500 MG tablet Take 1 tablet (500 mg total) by mouth daily with breakfast.   Multiple Vitamin (MULTIVITAMIN) tablet Take 1 tablet by mouth daily.   rosuvastatin  (CRESTOR ) 5 MG tablet Take 1 tablet (5 mg total) by mouth daily.   Semaglutide ,0.25 or 0.5MG /DOS, (OZEMPIC , 0.25 OR 0.5 MG/DOSE,) 2 MG/3ML SOPN Inject 0.5 mg into the skin once a week.   valsartan  (DIOVAN ) 320 MG tablet Take 1 tablet (320 mg total) by mouth daily.   "

## 2025-01-11 ENCOUNTER — Other Ambulatory Visit: Payer: Self-pay | Admitting: Family Medicine

## 2025-01-11 DIAGNOSIS — E1169 Type 2 diabetes mellitus with other specified complication: Secondary | ICD-10-CM

## 2025-03-08 ENCOUNTER — Ambulatory Visit: Admitting: Family Medicine
